# Patient Record
Sex: Female | Born: 1960 | Race: White | Hispanic: No | Marital: Single | Smoking: Current every day smoker
Health system: Southern US, Community
[De-identification: ages and names within clinical notes are randomized; demographics above are authoritative.]

## PROBLEM LIST (undated history)

## (undated) DIAGNOSIS — J439 Emphysema, unspecified: Secondary | ICD-10-CM

## (undated) DIAGNOSIS — T7840XA Allergy, unspecified, initial encounter: Secondary | ICD-10-CM

## (undated) DIAGNOSIS — E782 Mixed hyperlipidemia: Secondary | ICD-10-CM

## (undated) DIAGNOSIS — K8020 Calculus of gallbladder without cholecystitis without obstruction: Secondary | ICD-10-CM

## (undated) DIAGNOSIS — I700 Atherosclerosis of aorta: Secondary | ICD-10-CM

## (undated) DIAGNOSIS — Z789 Other specified health status: Secondary | ICD-10-CM

## (undated) HISTORY — PX: BREAST SURGERY: SHX581

## (undated) HISTORY — PX: AUGMENTATION MAMMAPLASTY: SUR837

## (undated) HISTORY — PX: FACIAL COSMETIC SURGERY: SHX629

## (undated) HISTORY — DX: Allergy, unspecified, initial encounter: T78.40XA

## (undated) HISTORY — DX: Emphysema, unspecified: J43.9

## (undated) HISTORY — PX: CHOLECYSTECTOMY: SHX55

## (undated) HISTORY — PX: COSMETIC SURGERY: SHX468

## (undated) HISTORY — PX: BREAST ENHANCEMENT SURGERY: SHX7

## (undated) HISTORY — DX: Other specified health status: Z78.9

---

## 2005-04-27 ENCOUNTER — Ambulatory Visit: Payer: Self-pay | Admitting: Unknown Physician Specialty

## 2005-05-10 ENCOUNTER — Ambulatory Visit: Payer: Self-pay | Admitting: Unknown Physician Specialty

## 2005-05-24 ENCOUNTER — Ambulatory Visit: Payer: Self-pay | Admitting: Otolaryngology

## 2005-11-20 ENCOUNTER — Ambulatory Visit: Payer: Self-pay | Admitting: Unknown Physician Specialty

## 2006-04-29 ENCOUNTER — Ambulatory Visit: Payer: Self-pay | Admitting: Unknown Physician Specialty

## 2007-06-19 ENCOUNTER — Ambulatory Visit: Payer: Self-pay | Admitting: Unknown Physician Specialty

## 2013-11-04 DIAGNOSIS — D229 Melanocytic nevi, unspecified: Secondary | ICD-10-CM

## 2013-11-04 HISTORY — DX: Melanocytic nevi, unspecified: D22.9

## 2014-05-06 ENCOUNTER — Ambulatory Visit: Payer: Self-pay | Admitting: Internal Medicine

## 2014-06-10 ENCOUNTER — Ambulatory Visit: Payer: Self-pay | Admitting: Gastroenterology

## 2017-04-01 LAB — HM MAMMOGRAPHY

## 2017-10-31 DIAGNOSIS — C4492 Squamous cell carcinoma of skin, unspecified: Secondary | ICD-10-CM

## 2017-10-31 HISTORY — DX: Squamous cell carcinoma of skin, unspecified: C44.92

## 2019-09-17 ENCOUNTER — Ambulatory Visit: Payer: Self-pay

## 2019-09-19 ENCOUNTER — Ambulatory Visit: Payer: 59 | Attending: Internal Medicine

## 2019-09-19 ENCOUNTER — Other Ambulatory Visit: Payer: Self-pay

## 2019-09-19 DIAGNOSIS — Z23 Encounter for immunization: Secondary | ICD-10-CM

## 2019-09-19 NOTE — Progress Notes (Signed)
   Covid-19 Vaccination Clinic  Name:  Aimee Lawson    MRN: [id number] DOB: [birth year]  09/19/2019  Ms. Aimee Lawson was observed post Covid-19 immunization for 15 minutes without incident. She was provided with Vaccine Information Sheet and instruction to access the V-Safe system.   Ms. Aimee Lawson was instructed to call 911 with any severe reactions post vaccine: Marland Kitchen Difficulty breathing  . Swelling of face and throat  . A fast heartbeat  . A bad rash all over body  . Dizziness and weakness   Immunizations Administered    Name Date Dose VIS Date Route   Pfizer COVID-19 Vaccine 09/19/2019  8:23 AM 0.3 mL 06/19/2019 Intramuscular   Manufacturer: [place]   Lot: [id number]   [place]: [id number]

## 2019-09-29 ENCOUNTER — Encounter: Payer: Self-pay | Admitting: Nurse Practitioner

## 2019-09-29 ENCOUNTER — Ambulatory Visit: Payer: 59 | Admitting: Nurse Practitioner

## 2019-09-29 ENCOUNTER — Other Ambulatory Visit: Payer: Self-pay

## 2019-09-29 VITALS — BP 132/79 | HR 83 | Temp 97.9°F | Ht 64.0 in | Wt 175.2 lb

## 2019-09-29 DIAGNOSIS — R17 Unspecified jaundice: Secondary | ICD-10-CM | POA: Diagnosis not present

## 2019-09-29 DIAGNOSIS — Z7689 Persons encountering health services in other specified circumstances: Secondary | ICD-10-CM

## 2019-09-29 DIAGNOSIS — F17210 Nicotine dependence, cigarettes, uncomplicated: Secondary | ICD-10-CM | POA: Diagnosis not present

## 2019-09-29 DIAGNOSIS — R0989 Other specified symptoms and signs involving the circulatory and respiratory systems: Secondary | ICD-10-CM | POA: Diagnosis not present

## 2019-09-29 NOTE — Progress Notes (Signed)
New Patient Office Visit  Subjective:  Patient ID: Aimee Lawson, female    DOB: 03/19/1961  Age: 59 y.o. MRN: [id number]  CC:  Chief Complaint  Patient presents with  . Establish Care  . Eye Problem    pt states she wants to discuss the color of her eyes, states she notices them having a yellow color to them every now and then     HPI Aimee Lawson presents for new patient visit to establish care.  Introduced to Designer, jewellery role and practice setting.  All questions answered.  Used to see previous NP in practice years ago and then had transferred elsewhere for some years.  Last pap in 2018 October.  Had a colonoscopy at age 63 or 7.  She agrees to sign to obtain old records.  JAUNDICE EYES INTERMITTENT: Noticed this over the past month, today they are better.  Does endorse drinking wine in winter, about one to two nights during a week has a couple glasses.  Has occasional beer when out with friends, social drinker, no heavy alcohol use. No history of blood transfusions.  Does have history of MJ and speed use in high school, no injectables.  Does have tattoos, 4 of them.  No Tylenol use at home.  She is a DES baby, her mother took this.  Is a current smoker, 1 PPD, started smoking at age 48.  Has tried everything to quit, patches, Chantix, hypnosis, gum, with no success.  Reports she is on verge of COPD per her previous PCP.  Takes Albuterol and Symbicort as needed.  In past she reports a PCP told her she had 30-50% blockage carotids, but has never seen vascular.  No statin or ASA use. Status: fluctuating Treatments attempted: none Fever: no Nausea: no Vomiting: no Weight loss: no Decreased appetite: no Diarrhea: no Constipation: yes Blood in stool: no Heartburn: no Jaundice: only in eyes per patient Rash: no Dysuria/urinary frequency: no Hematuria: no History of sexually transmitted disease: no Recurrent NSAID use: no     Office Visit from 09/29/2019  in [facility]  AUDIT-C Score  3      History reviewed. No pertinent past medical history.  Past Surgical History:  Procedure Laterality Date  . BREAST ENHANCEMENT SURGERY    . FACIAL COSMETIC SURGERY      Family History  Problem Relation Age of Onset  . Heart attack Mother   . Stroke Mother   . Uterine cancer Mother   . Colon cancer Mother   . Heart attack Father   . Hypertension Brother   . Diabetes Maternal Grandmother   . Heart disease Maternal Grandmother   . Heart disease Maternal Grandfather   . Heart disease Paternal Grandmother   . Heart disease Paternal Grandfather     Social History   Socioeconomic History  . Marital status: Married    Spouse name: Not on file  . Number of children: Not on file  . Years of education: Not on file  . Highest education level: Not on file  Occupational History  . Not on file  Tobacco Use  . Smoking status: Current Every Day Smoker    Packs/day: 1.00    Types: Cigarettes  . Smokeless tobacco: Never Used  Substance and Sexual Activity  . Alcohol use: Yes    Comment: socially  . Drug use: Never  . Sexual activity: Not Currently  Other Topics Concern  . Not on file  Social  History Narrative  . Not on file   Social Determinants of Health   Financial Resource Strain: Low Risk   . Difficulty of Paying Living Expenses: Not hard at all  Food Insecurity: No Food Insecurity  . Worried About Charity fundraiser in the Last Year: Never true  . Ran Out of Food in the Last Year: Never true  Transportation Needs: No Transportation Needs  . Lack of Transportation (Medical): No  . Lack of Transportation (Non-Medical): No  Physical Activity: Sufficiently Active  . Days of Exercise per Week: 5 days  . Minutes of Exercise per Session: 30 min  Stress: No Stress Concern Present  . Feeling of Stress : Not at all  Social Connections: Unknown  . Frequency of Communication with Friends and Family: Three times a week  .  Frequency of Social Gatherings with Friends and Family: Three times a week  . Attends Religious Services: Never  . Active Member of Clubs or Organizations: No  . Attends Archivist Meetings: Never  . Marital Status: Not on file  Intimate Partner Violence:   . Fear of Current or Ex-Partner:   . Emotionally Abused:   Marland Kitchen Physically Abused:   . Sexually Abused:     ROS Review of Systems  Constitutional: Negative for activity change, appetite change, diaphoresis, fatigue and fever.  Respiratory: Negative for cough, chest tightness, shortness of breath and wheezing.   Cardiovascular: Negative for chest pain, palpitations and leg swelling.  Gastrointestinal: Negative.   Neurological: Negative.   Psychiatric/Behavioral: Negative.     Objective:   Today's Vitals: BP 132/79   Pulse 83   Temp 97.9 F (36.6 C) (Oral)   Ht 5\' 4"  (1.626 m)   Wt 175 lb 3.2 oz (79.5 kg)   SpO2 97%   BMI 30.07 kg/m   Physical Exam Vitals and nursing note reviewed.  Constitutional:      General: She is awake. She is not in acute distress.    Appearance: She is well-developed and well-groomed. She is not ill-appearing.  HENT:     Head: Normocephalic.     Right Ear: Hearing normal.     Left Ear: Hearing normal.  Eyes:     General: Lids are normal. No scleral icterus.       Right eye: No discharge.        Left eye: No discharge.     Conjunctiva/sclera: Conjunctivae normal.     Pupils: Pupils are equal, round, and reactive to light.     Comments: No icterus noted today.    Neck:     Thyroid: No thyromegaly.     Vascular: Carotid bruit (bilateral R>L) present.  Cardiovascular:     Rate and Rhythm: Normal rate and regular rhythm.     Heart sounds: Normal heart sounds. No murmur. No gallop.   Pulmonary:     Effort: Pulmonary effort is normal. No accessory muscle usage or respiratory distress.     Breath sounds: Normal breath sounds.  Abdominal:     General: Bowel sounds are normal. There  is no distension or abdominal bruit.     Palpations: Abdomen is soft. There is no hepatomegaly or splenomegaly.     Tenderness: There is no abdominal tenderness.  Musculoskeletal:     Cervical back: Normal range of motion and neck supple.     Right lower leg: No edema.     Left lower leg: No edema.  Lymphadenopathy:     Head:  Right side of head: No submental, submandibular, tonsillar, preauricular or posterior auricular adenopathy.     Left side of head: No submental, submandibular, tonsillar, preauricular or posterior auricular adenopathy.     Cervical: No cervical adenopathy.  Skin:    General: Skin is warm and dry.     Coloration: Skin is not jaundiced.     Comments: No jaundice of skin noted.  Neurological:     Mental Status: She is alert and oriented to person, place, and time.  Psychiatric:        Attention and Perception: Attention normal.        Mood and Affect: Mood normal.        Speech: Speech normal.        Behavior: Behavior normal. Behavior is cooperative.     Assessment & Plan:   Problem List Items Addressed This Visit      Other   Cigarette nicotine dependence without complication    I have recommended complete cessation of tobacco use. I have discussed various options available for assistance with tobacco cessation including over the counter methods (Nicotine gum, patch and lozenges). We also discussed prescription options (Chantix, Nicotine Inhaler / Nasal Spray). The patient is not interested in pursuing any prescription tobacco cessation options at this time. Referral for lung CA screening placed after discussion with patient.       Relevant Orders   Ambulatory Referral for Lung Cancer Scre   Jaundice    Acute recently, improved today.  Reported to bilateral eyes, by patient.  No other symptoms reported and exam WNL.  Will obtain labs today to include CBC, CMP, lipid, TSH, HIV, and Hepatitis labs.  If abnormality noted will send for ultrasound, or if  return of discoloration of eyes noted.  Have recommended complete cessation of smoking.  Return in 6 weeks for annual physical, sooner if worsening or abnormal labs.      Relevant Orders   Hepatitis C antibody   Hepatitis A antibody, IgM   CBC with Differential/Platelet   HIV Antibody (routine testing w rflx)   Bilateral carotid bruits    Noted on exam and reports history of 30-50% blockage bilaterally several years ago.  Recommend complete cessation of smoking.  Discussed benefit of adding on daily statin and ASA, will obtain labs today and further discuss upcoming visit.  Referral to vascular for further assessment and recommendations.      Relevant Orders   Lipid Panel w/o Chol/HDL Ratio   Comprehensive metabolic panel   TSH   Ambulatory referral to Vascular Surgery    Other Visit Diagnoses    Encounter to establish care    -  Primary      Outpatient Encounter Medications as of 09/29/2019  Medication Sig  . albuterol (PROAIR HFA) 108 (90 Base) MCG/ACT inhaler Inhale 2 puffs into the lungs every 6 (six) hours as needed for wheezing or shortness of breath.  . budesonide-formoterol (SYMBICORT) 160-4.5 MCG/ACT inhaler Inhale 2 puffs into the lungs 2 (two) times daily as needed.   No facility-administered encounter medications on file as of 09/29/2019.    Follow-up: Return in about 6 weeks (around 11/10/2019) for Annual physical.   Venita Lick, NP

## 2019-09-29 NOTE — Patient Instructions (Signed)
Fat and Cholesterol Restricted Eating Plan Getting too much fat and cholesterol in your diet may cause health problems. Choosing the right foods helps keep your fat and cholesterol at normal levels. This can keep you from getting certain diseases. Your doctor may recommend an eating plan that includes:  Total fat: ______% or less of total calories a day.  Saturated fat: ______% or less of total calories a day.  Cholesterol: less than _________mg a day.  Fiber: ______g a day. What are tips for following this plan? Meal planning  At meals, divide your plate into four equal parts: ? Fill one-half of your plate with vegetables and green salads. ? Fill one-fourth of your plate with whole grains. ? Fill one-fourth of your plate with low-fat (lean) protein foods.  Eat fish that is high in omega-3 fats at least two times a week. This includes mackerel, tuna, sardines, and salmon.  Eat foods that are high in fiber, such as whole grains, beans, apples, broccoli, carrots, peas, and barley. General tips   Work with your doctor to lose weight if you need to.  Avoid: ? Foods with added sugar. ? Fried foods. ? Foods with partially hydrogenated oils.  Limit alcohol intake to no more than 1 drink a day for nonpregnant women and 2 drinks a day for men. One drink equals 12 oz of beer, 5 oz of wine, or 1 oz of hard liquor. Reading food labels  Check food labels for: ? Trans fats. ? Partially hydrogenated oils. ? Saturated fat (g) in each serving. ? Cholesterol (mg) in each serving. ? Fiber (g) in each serving.  Choose foods with healthy fats, such as: ? Monounsaturated fats. ? Polyunsaturated fats. ? Omega-3 fats.  Choose grain products that have whole grains. Look for the word "whole" as the first word in the ingredient list. Cooking  Cook foods using low-fat methods. These include baking, boiling, grilling, and broiling.  Eat more home-cooked foods. Eat at restaurants and buffets  less often.  Avoid cooking using saturated fats, such as butter, cream, palm oil, palm kernel oil, and coconut oil. Recommended foods  Fruits  All fresh, canned (in natural juice), or frozen fruits. Vegetables  Fresh or frozen vegetables (raw, steamed, roasted, or grilled). Green salads. Grains  Whole grains, such as whole wheat or whole grain breads, crackers, cereals, and pasta. Unsweetened oatmeal, bulgur, barley, quinoa, or brown rice. Corn or whole wheat flour tortillas. Meats and other protein foods  Ground beef (85% or leaner), grass-fed beef, or beef trimmed of fat. Skinless chicken or turkey. Ground chicken or turkey. Pork trimmed of fat. All fish and seafood. Egg whites. Dried beans, peas, or lentils. Unsalted nuts or seeds. Unsalted canned beans. Nut butters without added sugar or oil. Dairy  Low-fat or nonfat dairy products, such as skim or 1% milk, 2% or reduced-fat cheeses, low-fat and fat-free ricotta or cottage cheese, or plain low-fat and nonfat yogurt. Fats and oils  Tub margarine without trans fats. Light or reduced-fat mayonnaise and salad dressings. Avocado. Olive, canola, sesame, or safflower oils. The items listed above may not be a complete list of foods and beverages you can eat. Contact a dietitian for more information. Foods to avoid Fruits  Canned fruit in heavy syrup. Fruit in cream or butter sauce. Fried fruit. Vegetables  Vegetables cooked in cheese, cream, or butter sauce. Fried vegetables. Grains  White bread. White pasta. White rice. Cornbread. Bagels, pastries, and croissants. Crackers and snack foods that contain trans fat   and hydrogenated oils. Meats and other protein foods  Fatty cuts of meat. Ribs, chicken wings, bacon, sausage, bologna, salami, chitterlings, fatback, hot dogs, bratwurst, and packaged lunch meats. Liver and organ meats. Whole eggs and egg yolks. Chicken and turkey with skin. Fried meat. Dairy  Whole or 2% milk, cream,  half-and-half, and cream cheese. Whole milk cheeses. Whole-fat or sweetened yogurt. Full-fat cheeses. Nondairy creamers and whipped toppings. Processed cheese, cheese spreads, and cheese curds. Beverages  Alcohol. Sugar-sweetened drinks such as sodas, lemonade, and fruit drinks. Fats and oils  Butter, stick margarine, lard, shortening, ghee, or bacon fat. Coconut, palm kernel, and palm oils. Sweets and desserts  Corn syrup, sugars, honey, and molasses. Candy. Jam and jelly. Syrup. Sweetened cereals. Cookies, pies, cakes, donuts, muffins, and ice cream. The items listed above may not be a complete list of foods and beverages you should avoid. Contact a dietitian for more information. Summary  Choosing the right foods helps keep your fat and cholesterol at normal levels. This can keep you from getting certain diseases.  At meals, fill one-half of your plate with vegetables and green salads.  Eat high-fiber foods, like whole grains, beans, apples, carrots, peas, and barley.  Limit added sugar, saturated fats, alcohol, and fried foods. This information is not intended to replace advice given to you by your health care provider. Make sure you discuss any questions you have with your health care provider. Document Revised: 02/26/2018 Document Reviewed: 03/12/2017 Elsevier Patient Education  2020 Elsevier Inc.  

## 2019-09-29 NOTE — Assessment & Plan Note (Signed)
Noted on exam and reports history of 30-50% blockage bilaterally several years ago.  Recommend complete cessation of smoking.  Discussed benefit of adding on daily statin and ASA, will obtain labs today and further discuss upcoming visit.  Referral to vascular for further assessment and recommendations.

## 2019-09-29 NOTE — Assessment & Plan Note (Signed)
I have recommended complete cessation of tobacco use. I have discussed various options available for assistance with tobacco cessation including over the counter methods (Nicotine gum, patch and lozenges). We also discussed prescription options (Chantix, Nicotine Inhaler / Nasal Spray). The patient is not interested in pursuing any prescription tobacco cessation options at this time. Referral for lung CA screening placed after discussion with patient.

## 2019-09-29 NOTE — Assessment & Plan Note (Signed)
Acute recently, improved today.  Reported to bilateral eyes, by patient.  No other symptoms reported and exam WNL.  Will obtain labs today to include CBC, CMP, lipid, TSH, HIV, and Hepatitis labs.  If abnormality noted will send for ultrasound, or if return of discoloration of eyes noted.  Have recommended complete cessation of smoking.  Return in 6 weeks for annual physical, sooner if worsening or abnormal labs.

## 2019-09-30 LAB — LIPID PANEL W/O CHOL/HDL RATIO
Cholesterol, Total: 206 mg/dL — ABNORMAL HIGH (ref 100–199)
HDL: 78 mg/dL (ref >39)
LDL Chol Calc (NIH): 108 mg/dL — ABNORMAL HIGH (ref 0–99)
Triglycerides: 114 mg/dL (ref 0–149)
VLDL Cholesterol Cal: 20 mg/dL (ref 5–40)

## 2019-09-30 LAB — COMPREHENSIVE METABOLIC PANEL
ALT: 14 IU/L (ref 0–32)
AST: 14 IU/L (ref 0–40)
Albumin/Globulin Ratio: 2.1 (ref 1.2–2.2)
Albumin: 4.4 g/dL (ref 3.8–4.8)
Alkaline Phosphatase: 98 IU/L (ref 39–117)
BUN/Creatinine Ratio: 20 (ref 12–28)
BUN: 16 mg/dL (ref 8–27)
Bilirubin Total: 0.6 mg/dL (ref 0.0–1.2)
CO2: 26 mmol/L (ref 20–29)
Calcium: 9.2 mg/dL (ref 8.7–10.3)
Chloride: 103 mmol/L (ref 96–106)
Creatinine, Ser: 0.79 mg/dL (ref 0.57–1.00)
GFR calc Af Amer: 93 mL/min/{1.73_m2} (ref >59)
GFR calc non Af Amer: 81 mL/min/{1.73_m2} (ref >59)
Globulin, Total: 2.1 g/dL (ref 1.5–4.5)
Glucose: 79 mg/dL (ref 65–99)
Potassium: 4.2 mmol/L (ref 3.5–5.2)
Sodium: 141 mmol/L (ref 134–144)
Total Protein: 6.5 g/dL (ref 6.0–8.5)

## 2019-09-30 LAB — CBC WITH DIFFERENTIAL/PLATELET
Basophils Absolute: 0 10*3/uL (ref 0.0–0.2)
Basos: 0 %
EOS (ABSOLUTE): 0.1 10*3/uL (ref 0.0–0.4)
Eos: 1 %
Hematocrit: 42.5 % (ref 34.0–46.6)
Hemoglobin: 14.3 g/dL (ref 11.1–15.9)
Immature Grans (Abs): 0 10*3/uL (ref 0.0–0.1)
Immature Granulocytes: 0 %
Lymphocytes Absolute: 2.6 10*3/uL (ref 0.7–3.1)
Lymphs: 42 %
MCH: 31.6 pg (ref 26.6–33.0)
MCHC: 33.6 g/dL (ref 31.5–35.7)
MCV: 94 fL (ref 79–97)
Monocytes Absolute: 0.4 10*3/uL (ref 0.1–0.9)
Monocytes: 7 %
Neutrophils Absolute: 3.1 10*3/uL (ref 1.4–7.0)
Neutrophils: 50 %
Platelets: 207 10*3/uL (ref 150–450)
RBC: 4.53 x10E6/uL (ref 3.77–5.28)
RDW: 12.1 % (ref 11.7–15.4)
WBC: 6.2 10*3/uL (ref 3.4–10.8)

## 2019-09-30 LAB — HEPATITIS A ANTIBODY, IGM: Hep A IgM: NEGATIVE

## 2019-09-30 LAB — HEPATITIS C ANTIBODY: Hep C Virus Ab: <0.1 s/co ratio (ref 0.0–0.9)

## 2019-09-30 LAB — HIV ANTIBODY (ROUTINE TESTING W REFLEX): HIV Screen 4th Generation wRfx: NONREACTIVE

## 2019-09-30 LAB — TSH: TSH: 1.96 u[IU]/mL (ref 0.450–4.500)

## 2019-09-30 NOTE — Progress Notes (Signed)
Good morning.  Please let Eilah know labs returned: - Kidney, liver, thyroid testing normal.  If jaundice to eyes returns before next visit please come in immediately so I can see, at this time liver testing is normal. - Hep A and Hep C are negative, as is HIV - Blood counts show no anemia - cholesterol levels are slightly elevated -- her risk score for stroke or heart even is low at 6.5%, but may benefit from starting a low dose statin to help with prevention of stroke, especially with her bruits which vascular will be seeing her for and smoking history.  If she would like to start a low dose statin let me know or we can discuss it more next visit. Thank you.  Have a great day!!

## 2019-10-01 ENCOUNTER — Telehealth: Payer: Self-pay | Admitting: *Deleted

## 2019-10-01 NOTE — Telephone Encounter (Signed)
Patient returned call. Reports 2nd covid vaccine April 7th, will contact at later date to schedule at least 4 weeks after vaccine.

## 2019-10-01 NOTE — Telephone Encounter (Signed)
Received referral for low dose lung cancer screening CT scan. Message left at phone number listed in EMR for patient to call me back to facilitate scheduling scan.  

## 2019-10-13 ENCOUNTER — Ambulatory Visit: Payer: 59 | Attending: Internal Medicine

## 2019-10-13 DIAGNOSIS — Z23 Encounter for immunization: Secondary | ICD-10-CM

## 2019-10-13 NOTE — Progress Notes (Signed)
   Covid-19 Vaccination Clinic  Name:  Aimee Lawson    MRN: [id number] DOB: [birth year]  10/13/2019  Ms. Aimee Lawson was observed post Covid-19 immunization for 15 minutes without incident. She was provided with Vaccine Information Sheet and instruction to access the V-Safe system.   Ms. Aimee Lawson was instructed to call 911 with any severe reactions post vaccine: Marland Kitchen Difficulty breathing  . Swelling of face and throat  . A fast heartbeat  . A bad rash all over body  . Dizziness and weakness   Immunizations Administered    Name Date Dose VIS Date Route   Pfizer COVID-19 Vaccine 10/13/2019 10:36 AM 0.3 mL 06/19/2019 Intramuscular   Manufacturer: [place]   Lot: [id number]   [place]: [id number]

## 2019-10-21 ENCOUNTER — Other Ambulatory Visit (INDEPENDENT_AMBULATORY_CARE_PROVIDER_SITE_OTHER): Payer: Self-pay | Admitting: Vascular Surgery

## 2019-10-21 DIAGNOSIS — R0989 Other specified symptoms and signs involving the circulatory and respiratory systems: Secondary | ICD-10-CM

## 2019-10-21 DIAGNOSIS — I6523 Occlusion and stenosis of bilateral carotid arteries: Secondary | ICD-10-CM

## 2019-10-22 ENCOUNTER — Other Ambulatory Visit: Payer: Self-pay

## 2019-10-22 ENCOUNTER — Ambulatory Visit (INDEPENDENT_AMBULATORY_CARE_PROVIDER_SITE_OTHER): Payer: 59

## 2019-10-22 ENCOUNTER — Encounter (INDEPENDENT_AMBULATORY_CARE_PROVIDER_SITE_OTHER): Payer: Self-pay | Admitting: Vascular Surgery

## 2019-10-22 ENCOUNTER — Ambulatory Visit (INDEPENDENT_AMBULATORY_CARE_PROVIDER_SITE_OTHER): Payer: 59 | Admitting: Vascular Surgery

## 2019-10-22 VITALS — BP 125/78 | HR 72 | Resp 16 | Ht 64.0 in | Wt 173.0 lb

## 2019-10-22 DIAGNOSIS — R0989 Other specified symptoms and signs involving the circulatory and respiratory systems: Secondary | ICD-10-CM | POA: Diagnosis not present

## 2019-10-22 DIAGNOSIS — I6523 Occlusion and stenosis of bilateral carotid arteries: Secondary | ICD-10-CM | POA: Diagnosis not present

## 2019-10-31 ENCOUNTER — Encounter (INDEPENDENT_AMBULATORY_CARE_PROVIDER_SITE_OTHER): Payer: Self-pay | Admitting: Vascular Surgery

## 2019-10-31 NOTE — Progress Notes (Signed)
MRN : [id number]  Aimee Lawson is a 59 y.o. (January 30, 1961) female who presents with chief complaint of  Chief Complaint  Patient presents with  . New Patient (Initial Visit)    ref Cannady carotid bruit  .  History of Present Illness:   The patient is seen for evaluation of carotid stenosis.   Carotid bruits were auscultated.  The patient denies amaurosis fugax. There is no recent history of TIA symptoms or focal motor deficits. There is no prior documented CVA.  There is no history of migraine headaches. There is no history of seizures.  The patient is taking enteric-coated aspirin 81 mg daily.  The patient has a history of coronary artery disease, no recent episodes of angina or shortness of breath. The patient denies PAD or claudication symptoms. There is a history of hyperlipidemia which is being treated with a statin.    Current Meds  Medication Sig  . albuterol (PROAIR HFA) 108 (90 Base) MCG/ACT inhaler Inhale 2 puffs into the lungs every 6 (six) hours as needed for wheezing or shortness of breath.  . budesonide-formoterol (SYMBICORT) 160-4.5 MCG/ACT inhaler Inhale 2 puffs into the lungs 2 (two) times daily as needed.    Past Medical History:  Diagnosis Date  . No pertinent past medical history     Past Surgical History:  Procedure Laterality Date  . BREAST ENHANCEMENT SURGERY    . FACIAL COSMETIC SURGERY      Social History Social History   Tobacco Use  . Smoking status: Current Every Day Smoker    Packs/day: 1.00    Types: Cigarettes  . Smokeless tobacco: Never Used  Substance Use Topics  . Alcohol use: Yes    Comment: socially  . Drug use: Never    Family History Family History  Problem Relation Age of Onset  . Heart attack Mother   . Stroke Mother   . Uterine cancer Mother   . Colon cancer Mother   . Heart attack Father   . Hypertension Brother   . Diabetes Maternal Grandmother   . Heart disease Maternal Grandmother   . Heart disease  Maternal Grandfather   . Heart disease Paternal Grandmother   . Heart disease Paternal Grandfather   No family history of bleeding/clotting disorders, porphyria or autoimmune disease   Allergies  Allergen Reactions  . Penicillins Anaphylaxis     REVIEW OF SYSTEMS (Negative unless checked)  Constitutional: [] Weight loss  [] Fever  [] Chills Cardiac: [] Chest pain   [] Chest pressure   [] Palpitations   [] Shortness of breath when laying flat   [] Shortness of breath with exertion. Vascular:  [] Pain in legs with walking   [] Pain in legs at rest  [] History of DVT   [] Phlebitis   [] Swelling in legs   [] Varicose veins   [] Non-healing ulcers Pulmonary:   [] Uses home oxygen   [] Productive cough   [] Hemoptysis   [] Wheeze  [] COPD   [] Asthma Neurologic:  [] Dizziness   [] Seizures   [] History of stroke   [] History of TIA  [] Aphasia   [] Vissual changes   [] Weakness or numbness in arm   [] Weakness or numbness in leg Musculoskeletal:   [] Joint swelling   [] Joint pain   [] Low back pain Hematologic:  [] Easy bruising  [] Easy bleeding   [] Hypercoagulable state   [] Anemic Gastrointestinal:  [] Diarrhea   [] Vomiting  [] Gastroesophageal reflux/heartburn   [] Difficulty swallowing. Genitourinary:  [] Chronic kidney disease   [] Difficult urination  [] Frequent urination   [] Blood in urine Skin:  [] Rashes   []   Ulcers  Psychological:  [] History of anxiety   []  History of major depression.  Physical Examination  Vitals:   10/22/19 1530  BP: 125/78  Pulse: 72  Resp: 16  Weight: 173 lb (78.5 kg)  Height: 5\' 4"  (1.626 m)   Body mass index is 29.7 kg/m. Gen: WD/WN, NAD Head: [place]/AT, No temporalis wasting.  Ear/Nose/Throat: Hearing grossly intact, nares w/o erythema or drainage, poor dentition Eyes: PER, EOMI, sclera nonicteric.  Neck: Supple, no masses.  No bruit or JVD.  Pulmonary:  Good air movement, clear to auscultation bilaterally, no use of accessory muscles.  Cardiac: RRR, normal S1, S2, no  Murmurs. Vascular: right carotid bruit noted Vessel Right Left  Radial Palpable Palpable  Ulnar Palpable Palpable  Brachial Palpable Palpable  Carotid Palpable Palpable  Gastrointestinal: soft, non-distended. No guarding/no peritoneal signs.  Musculoskeletal: M/S 5/5 throughout.  No deformity or atrophy.  Neurologic: CN 2-12 intact. Pain and light touch intact in extremities.  Symmetrical.  Speech is fluent. Motor exam as listed above. Psychiatric: Judgment intact, Mood & affect appropriate for pt's clinical situation. Dermatologic: No rashes or ulcers noted.  No changes consistent with cellulitis. Lymph : No Cervical lymphadenopathy, no lichenification or skin changes of chronic lymphedema.  CBC Lab Results  Component Value Date   WBC 6.2 09/29/2019   HGB 14.3 09/29/2019   HCT 42.5 09/29/2019   MCV 94 09/29/2019   PLT 207 09/29/2019    BMET    Component Value Date/Time   NA 141 09/29/2019 1425   K 4.2 09/29/2019 1425   CL 103 09/29/2019 1425   CO2 26 09/29/2019 1425   GLUCOSE 79 09/29/2019 1425   BUN 16 09/29/2019 1425   CREATININE 0.79 09/29/2019 1425   CALCIUM 9.2 09/29/2019 1425   GFRNONAA 81 09/29/2019 1425   GFRAA 93 09/29/2019 1425   CrCl cannot be calculated (Patient's most recent lab result is older than the maximum 21 days allowed.).  COAG No results found for: INR, PROTIME  Radiology VAS US CAROTID  Result Date: 10/29/2019 Carotid Arterial Duplex Study Indications: Bilateral bruits. Performing Technologist: Almira Coaster RVS  Examination Guidelines: A complete evaluation includes B-mode imaging, spectral Doppler, color Doppler, and power Doppler as needed of all accessible portions of each vessel. Bilateral testing is considered an integral part of a complete examination. Limited examinations for reoccurring indications may be performed as noted.  Right Carotid Findings: +----------+--------+--------+--------+------------------+--------+           PSV  cm/sEDV cm/sStenosisPlaque DescriptionComments +----------+--------+--------+--------+------------------+--------+ CCA Prox  123     33                                         +----------+--------+--------+--------+------------------+--------+ CCA Mid   100     31                                         +----------+--------+--------+--------+------------------+--------+ CCA Distal95      28                                         +----------+--------+--------+--------+------------------+--------+ ICA Prox  79      29                                         +----------+--------+--------+--------+------------------+--------+  ICA Mid   94      36                                         +----------+--------+--------+--------+------------------+--------+ ICA Distal85      37                                         +----------+--------+--------+--------+------------------+--------+ ECA       124     24                                         +----------+--------+--------+--------+------------------+--------+ +----------+--------+-------+--------+-------------------+           PSV cm/sEDV cmsDescribeArm Pressure (mmHG) +----------+--------+-------+--------+-------------------+ [id number]     0                                  +----------+--------+-------+--------+-------------------+ +---------+--------+--+--------+--+ VertebralPSV cm/s66EDV cm/s21 +---------+--------+--+--------+--+  Left Carotid Findings: +----------+--------+--------+--------+------------------+--------+           PSV cm/sEDV cm/sStenosisPlaque DescriptionComments +----------+--------+--------+--------+------------------+--------+ CCA Prox  114     36                                         +----------+--------+--------+--------+------------------+--------+ CCA Mid   90      28                                          +----------+--------+--------+--------+------------------+--------+ CCA Distal81      33                                         +----------+--------+--------+--------+------------------+--------+ ICA Prox  56      24                                         +----------+--------+--------+--------+------------------+--------+ ICA Mid   93      40                                         +----------+--------+--------+--------+------------------+--------+ ICA Distal86      38                                         +----------+--------+--------+--------+------------------+--------+ ECA       89      19                                         +----------+--------+--------+--------+------------------+--------+ +----------+--------+--------+--------+-------------------+  PSV cm/sEDV cm/sDescribeArm Pressure (mmHG) +----------+--------+--------+--------+-------------------+ Subclavian201     0                                   +----------+--------+--------+--------+-------------------+ +---------+--------+--+--------+--+ VertebralPSV cm/s92EDV cm/s33 +---------+--------+--+--------+--+   Summary: Right Carotid: There is no evidence of stenosis in the right ICA. Left Carotid: There is no evidence of stenosis in the left ICA. Vertebrals:  Bilateral vertebral arteries demonstrate antegrade flow. Subclavians: Normal flow hemodynamics were seen in bilateral subclavian              arteries. *See table(s) above for measurements and observations.  Electronically signed by Hortencia Pilar MD on 10/29/2019 at 9:08:11 AM.    Final      Assessment/Plan 1. Bilateral carotid bruits Recommend:  Given the patient's asymptomatic subcritical stenosis no further invasive testing or surgery at this time.  Duplex ultrasound shows <50% stenosis bilaterally which has been unchanged when compared to the previous studies.  Continue antiplatelet therapy as prescribed Continue  management of CAD, HTN and Hyperlipidemia Healthy heart diet,  encouraged exercise at least 4 times per week  Given the stable <50% bilateral carotid stenosis in association with the patient's age the patient will follow up PRN.  The patient is told that if symptoms of a TIA should occur then he should go to the ER and I should be notified, as this would change the management course.  The patient voices understanding.     Hortencia Pilar, MD  10/31/2019 12:52 PM

## 2019-11-11 ENCOUNTER — Encounter: Payer: Self-pay | Admitting: Nurse Practitioner

## 2019-11-11 ENCOUNTER — Other Ambulatory Visit: Payer: Self-pay

## 2019-11-11 ENCOUNTER — Ambulatory Visit (INDEPENDENT_AMBULATORY_CARE_PROVIDER_SITE_OTHER): Payer: 59 | Admitting: Nurse Practitioner

## 2019-11-11 VITALS — BP 104/66 | HR 75 | Temp 98.1°F | Ht 64.0 in | Wt 172.0 lb

## 2019-11-11 DIAGNOSIS — Z6829 Body mass index (BMI) 29.0-29.9, adult: Secondary | ICD-10-CM

## 2019-11-11 DIAGNOSIS — E7800 Pure hypercholesterolemia, unspecified: Secondary | ICD-10-CM

## 2019-11-11 DIAGNOSIS — E785 Hyperlipidemia, unspecified: Secondary | ICD-10-CM | POA: Insufficient documentation

## 2019-11-11 DIAGNOSIS — F17210 Nicotine dependence, cigarettes, uncomplicated: Secondary | ICD-10-CM

## 2019-11-11 DIAGNOSIS — R0989 Other specified symptoms and signs involving the circulatory and respiratory systems: Secondary | ICD-10-CM | POA: Diagnosis not present

## 2019-11-11 DIAGNOSIS — Z1211 Encounter for screening for malignant neoplasm of colon: Secondary | ICD-10-CM

## 2019-11-11 DIAGNOSIS — Z1231 Encounter for screening mammogram for malignant neoplasm of breast: Secondary | ICD-10-CM

## 2019-11-11 DIAGNOSIS — Z6831 Body mass index (BMI) 31.0-31.9, adult: Secondary | ICD-10-CM | POA: Insufficient documentation

## 2019-11-11 DIAGNOSIS — Z0000 Encounter for general adult medical examination without abnormal findings: Secondary | ICD-10-CM | POA: Diagnosis not present

## 2019-11-11 DIAGNOSIS — E669 Obesity, unspecified: Secondary | ICD-10-CM | POA: Insufficient documentation

## 2019-11-11 NOTE — Assessment & Plan Note (Signed)
<  50% stenosis on imaging.  Follow-up with vascular PRN or is symptomatic, plan to recheck imaging in 5-6 years.  Recommend complete cessation of smoking. 

## 2019-11-11 NOTE — Assessment & Plan Note (Signed)
Noted on recent labs, ASCVD 4.1%.  Recommend focus on diet and exercise regimen + complete cessation smoking.

## 2019-11-11 NOTE — Progress Notes (Signed)
BP 104/66   Pulse 75   Temp 98.1 F (36.7 C) (Oral)   Ht 5\' 4"  (1.626 m)   Wt 172 lb (78 kg)   LMP 09/07/2010 (Approximate)   SpO2 98%   BMI 29.52 kg/m    Subjective:    Patient ID: Aimee Lawson, female    DOB: [birth year], 59 y.o.   MRN: [id number]  HPI: Aimee Lawson is a 59 y.o. female presenting on 11/11/2019 for comprehensive medical examination. Current medical complaints include:none  She currently lives with: self Menopausal Symptoms: no   CAROTID BRUITS: Saw vascular on 10/22/2019 -- had imaging which showed <50% stenosis bilaterally, unchanged from previous studies.  Asymptomatic.  To recheck in 5-6 years.  She continues to smoke about 1 PPD, not interested in quitting at this time.  Is going to have lung screening in June.  Has inhalers, but rarely uses.  The 10-year ASCVD risk score Mikey Bussing DC Brooke Bonito., et al., 2013) is: 4.1%   Values used to calculate the score:     Age: 59 years     Sex: Female     Is Non-Hispanic African American: No     Diabetic: No     Tobacco smoker: Yes     Systolic Blood Pressure: 123456 mmHg     Is BP treated: No     HDL Cholesterol: 78 mg/dL     Total Cholesterol: 206 mg/dL  Depression Screen done today and results listed below:  Depression screen [facility] 2/9 09/29/2019  Decreased Interest 0  Down, Depressed, Hopeless 0  PHQ - 2 Score 0    The patient does not have a history of falls. I did not complete a risk assessment for falls. A plan of care for falls was not documented.   Past Medical History:  Past Medical History:  Diagnosis Date  . No pertinent past medical history     Surgical History:  Past Surgical History:  Procedure Laterality Date  . BREAST ENHANCEMENT SURGERY    . FACIAL COSMETIC SURGERY      Medications:  Current Outpatient Medications on File Prior to Visit  Medication Sig  . albuterol (PROAIR HFA) 108 (90 Base) MCG/ACT inhaler Inhale 2 puffs into the lungs every 6 (six) hours as needed for wheezing or  shortness of breath.  . budesonide-formoterol (SYMBICORT) 160-4.5 MCG/ACT inhaler Inhale 2 puffs into the lungs 2 (two) times daily as needed.   No current facility-administered medications on file prior to visit.    Allergies:  Allergies  Allergen Reactions  . Penicillins Anaphylaxis    Social History:  Social History   Socioeconomic History  . Marital status: Married    Spouse name: Not on file  . Number of children: Not on file  . Years of education: Not on file  . Highest education level: Not on file  Occupational History  . Not on file  Tobacco Use  . Smoking status: Current Every Day Smoker    Packs/day: 1.00    Types: Cigarettes  . Smokeless tobacco: Never Used  Substance and Sexual Activity  . Alcohol use: Yes    Comment: socially  . Drug use: Never  . Sexual activity: Not Currently  Other Topics Concern  . Not on file  Social History Narrative  . Not on file   Social Determinants of Health   Financial Resource Strain: Low Risk   . Difficulty of Paying Living Expenses: Not hard at all  Food Insecurity: No Food Insecurity  .  Worried About Charity fundraiser in the Last Year: Never true  . Ran Out of Food in the Last Year: Never true  Transportation Needs: No Transportation Needs  . Lack of Transportation (Medical): No  . Lack of Transportation (Non-Medical): No  Physical Activity: Sufficiently Active  . Days of Exercise per Week: 5 days  . Minutes of Exercise per Session: 30 min  Stress: No Stress Concern Present  . Feeling of Stress : Not at all  Social Connections: Unknown  . Frequency of Communication with Friends and Family: Three times a week  . Frequency of Social Gatherings with Friends and Family: Three times a week  . Attends Religious Services: Never  . Active Member of Clubs or Organizations: No  . Attends Archivist Meetings: Never  . Marital Status: Not on file  Intimate Partner Violence:   . Fear of Current or Ex-Partner:     . Emotionally Abused:   Marland Kitchen Physically Abused:   . Sexually Abused:    Social History   Tobacco Use  Smoking Status Current Every Day Smoker  . Packs/day: 1.00  . Types: Cigarettes  Smokeless Tobacco Never Used   Social History   Substance and Sexual Activity  Alcohol Use Yes   Comment: socially    Family History:  Family History  Problem Relation Age of Onset  . Heart attack Mother   . Stroke Mother   . Uterine cancer Mother   . Colon cancer Mother   . Heart attack Father   . Hypertension Brother   . Diabetes Maternal Grandmother   . Heart disease Maternal Grandmother   . Heart disease Maternal Grandfather   . Heart disease Paternal Grandmother   . Heart disease Paternal Grandfather     Past medical history, surgical history, medications, allergies, family history and social history reviewed with patient today and changes made to appropriate areas of the chart.   Review of Systems - negative All other ROS negative except what is listed above and in the HPI.      Objective:    BP 104/66   Pulse 75   Temp 98.1 F (36.7 C) (Oral)   Ht 5\' 4"  (1.626 m)   Wt 172 lb (78 kg)   LMP 09/07/2010 (Approximate)   SpO2 98%   BMI 29.52 kg/m   Wt Readings from Last 3 Encounters:  11/11/19 172 lb (78 kg)  10/22/19 173 lb (78.5 kg)  09/29/19 175 lb 3.2 oz (79.5 kg)    Physical Exam Constitutional:      General: She is awake. She is not in acute distress.    Appearance: She is well-developed. She is not ill-appearing.  HENT:     Head: Normocephalic and atraumatic.     Right Ear: Hearing, tympanic membrane, ear canal and external ear normal. No drainage.     Left Ear: Hearing, tympanic membrane, ear canal and external ear normal. No drainage.     Nose: Nose normal.     Right Sinus: No maxillary sinus tenderness or frontal sinus tenderness.     Left Sinus: No maxillary sinus tenderness or frontal sinus tenderness.     Mouth/Throat:     Mouth: Mucous membranes are  moist.     Pharynx: Oropharynx is clear. Uvula midline. No pharyngeal swelling, oropharyngeal exudate or posterior oropharyngeal erythema.  Eyes:     General: Lids are normal.        Right eye: No discharge.  Left eye: No discharge.     Extraocular Movements: Extraocular movements intact.     Conjunctiva/sclera: Conjunctivae normal.     Pupils: Pupils are equal, round, and reactive to light.     Visual Fields: Right eye visual fields normal and left eye visual fields normal.  Neck:     Thyroid: No thyromegaly.     Vascular: Carotid bruit (subtle R>L) present.     Trachea: Trachea normal.  Cardiovascular:     Rate and Rhythm: Normal rate and regular rhythm.     Heart sounds: Normal heart sounds. No murmur. No gallop.   Pulmonary:     Effort: Pulmonary effort is normal. No accessory muscle usage or respiratory distress.     Breath sounds: Normal breath sounds.  Chest:     Breasts:        Right: Normal.        Left: Normal.  Abdominal:     General: Bowel sounds are normal.     Palpations: Abdomen is soft. There is no hepatomegaly or splenomegaly.     Tenderness: There is no abdominal tenderness.  Musculoskeletal:        General: Normal range of motion.     Cervical back: Normal range of motion and neck supple.     Right lower leg: No edema.     Left lower leg: No edema.  Lymphadenopathy:     Head:     Right side of head: No submental, submandibular, tonsillar, preauricular or posterior auricular adenopathy.     Left side of head: No submental, submandibular, tonsillar, preauricular or posterior auricular adenopathy.     Cervical: No cervical adenopathy.     Upper Body:     Right upper body: No supraclavicular, axillary or pectoral adenopathy.     Left upper body: No supraclavicular, axillary or pectoral adenopathy.  Skin:    General: Skin is warm and dry.     Capillary Refill: Capillary refill takes less than 2 seconds.     Findings: No rash.  Neurological:     Mental  Status: She is alert and oriented to person, place, and time.     Cranial Nerves: Cranial nerves are intact.     Gait: Gait is intact.     Deep Tendon Reflexes: Reflexes are normal and symmetric.     Reflex Scores:      Brachioradialis reflexes are 2+ on the right side and 2+ on the left side.      Patellar reflexes are 2+ on the right side and 2+ on the left side. Psychiatric:        Attention and Perception: Attention normal.        Mood and Affect: Mood normal.        Speech: Speech normal.        Behavior: Behavior normal. Behavior is cooperative.        Thought Content: Thought content normal.        Judgment: Judgment normal.     Results for orders placed or performed in visit on 09/29/19  HM MAMMOGRAPHY  Result Value Ref Range   HM Mammogram 0-4 Bi-Rad 0-4 Bi-Rad, Self Reported Normal  Lipid Panel w/o Chol/HDL Ratio  Result Value Ref Range   Cholesterol, Total 206 (H) 100 - 199 mg/dL   Triglycerides 114 0 - 149 mg/dL   HDL 78 >39 mg/dL   VLDL Cholesterol Cal 20 5 - 40 mg/dL   LDL Chol Calc (NIH) 108 (H) 0 - 99  mg/dL  Comprehensive metabolic panel  Result Value Ref Range   Glucose 79 65 - 99 mg/dL   BUN 16 8 - 27 mg/dL   Creatinine, Ser 0.79 0.57 - 1.00 mg/dL   GFR calc non Af Amer 81 >59 mL/min/1.73   GFR calc Af Amer 93 >59 mL/min/1.73   BUN/Creatinine Ratio 20 12 - 28   Sodium 141 134 - 144 mmol/L   Potassium 4.2 3.5 - 5.2 mmol/L   Chloride 103 96 - 106 mmol/L   CO2 26 20 - 29 mmol/L   Calcium 9.2 8.7 - 10.3 mg/dL   Total Protein 6.5 6.0 - 8.5 g/dL   Albumin 4.4 3.8 - 4.8 g/dL   Globulin, Total 2.1 1.5 - 4.5 g/dL   Albumin/Globulin Ratio 2.1 1.2 - 2.2   Bilirubin Total 0.6 0.0 - 1.2 mg/dL   Alkaline Phosphatase 98 39 - 117 IU/L   AST 14 0 - 40 IU/L   ALT 14 0 - 32 IU/L  Hepatitis C antibody  Result Value Ref Range   Hep C Virus Ab <0.1 0.0 - 0.9 s/co ratio  Hepatitis A antibody, IgM  Result Value Ref Range   Hep A IgM Negative Negative  CBC with  Differential/Platelet  Result Value Ref Range   WBC 6.2 3.4 - 10.8 x10E3/uL   RBC 4.53 3.77 - 5.28 x10E6/uL   Hemoglobin 14.3 11.1 - 15.9 g/dL   Hematocrit 42.5 34.0 - 46.6 %   MCV 94 79 - 97 fL   MCH 31.6 26.6 - 33.0 pg   MCHC 33.6 31.5 - 35.7 g/dL   RDW 12.1 11.7 - 15.4 %   Platelets 207 150 - 450 x10E3/uL   Neutrophils 50 Not Estab. %   Lymphs 42 Not Estab. %   Monocytes 7 Not Estab. %   Eos 1 Not Estab. %   Basos 0 Not Estab. %   Neutrophils Absolute 3.1 1.4 - 7.0 x10E3/uL   Lymphocytes Absolute 2.6 0.7 - 3.1 x10E3/uL   Monocytes Absolute 0.4 0.1 - 0.9 x10E3/uL   EOS (ABSOLUTE) 0.1 0.0 - 0.4 x10E3/uL   Basophils Absolute 0.0 0.0 - 0.2 x10E3/uL   Immature Granulocytes 0 Not Estab. %   Immature Grans (Abs) 0.0 0.0 - 0.1 x10E3/uL  TSH  Result Value Ref Range   TSH 1.960 0.450 - 4.500 uIU/mL  HIV Antibody (routine testing w rflx)  Result Value Ref Range   HIV Screen 4th Generation wRfx Non Reactive Non Reactive      Assessment & Plan:   Problem List Items Addressed This Visit      Other   Cigarette nicotine dependence without complication    I have recommended complete cessation of tobacco use. I have discussed various options available for assistance with tobacco cessation including over the counter methods (Nicotine gum, patch and lozenges). We also discussed prescription options (Chantix, Nicotine Inhaler / Nasal Spray). The patient is not interested in pursuing any prescription tobacco cessation options at this time.       Bilateral carotid bruits    <50% stenosis on imaging.  Follow-up with vascular PRN or is symptomatic, plan to recheck imaging in 5-6 years.  Recommend complete cessation of smoking.      Elevated LDL cholesterol level    Noted on recent labs, ASCVD 4.1%.  Recommend focus on diet and exercise regimen + complete cessation smoking.      BMI 29.0-29.9,adult    Recommended eating smaller high protein, low fat meals more frequently  and exercising 30  mins a day 5 times a week with a goal of 10-15lb weight loss in the next 3 months. Patient voiced their understanding and motivation to adhere to these recommendations.        Other Visit Diagnoses    Encounter for annual physical exam    -  Primary   Had all labs performed recent visit   Colon cancer screening       Cologuard order   Relevant Orders   Cologuard   Encounter for screening mammogram for malignant neoplasm of breast       Mammogram order   Relevant Orders   MM 3D SCREEN BREAST BILATERAL       Follow up plan: Return in about 5 months (around 04/12/2020) for Pap smear.   LABORATORY TESTING:  - Pap smear: Due in October, history of + HPV  IMMUNIZATIONS:   - Tdap: Tetanus vaccination status reviewed: just had Covid vaccine - Influenza: Up to date - Pneumovax: Not applicable - Prevnar: Not applicable - HPV: Not applicable - Zostavax vaccine: Refused  SCREENING: -Mammogram: Ordered today  - Colonoscopy: Ordered today -- Cologuard - Bone Density: Not applicable  -Hearing Test: Not applicable  -Spirometry: Not applicable   PATIENT COUNSELING:   Advised to take 1 mg of folate supplement per day if capable of pregnancy.   Sexuality: Discussed sexually transmitted diseases, partner selection, use of condoms, avoidance of unintended pregnancy  and contraceptive alternatives.   Advised to avoid cigarette smoking.  I discussed with the patient that most people either abstain from alcohol or drink within safe limits (<=14/week and <=4 drinks/occasion for males, <=7/weeks and <= 3 drinks/occasion for females) and that the risk for alcohol disorders and other health effects rises proportionally with the number of drinks per week and how often a drinker exceeds daily limits.  Discussed cessation/primary prevention of drug use and availability of treatment for abuse.   Diet: Encouraged to adjust caloric intake to maintain  or achieve ideal body weight, to reduce intake  of dietary saturated fat and total fat, to limit sodium intake by avoiding high sodium foods and not adding table salt, and to maintain adequate dietary potassium and calcium preferably from fresh fruits, vegetables, and low-fat dairy products.    stressed the importance of regular exercise  Injury prevention: Discussed safety belts, safety helmets, smoke detector, smoking near bedding or upholstery.   Dental health: Discussed importance of regular tooth brushing, flossing, and dental visits.    NEXT PREVENTATIVE PHYSICAL DUE IN 1 YEAR. Return in about 5 months (around 04/12/2020) for Pap smear.

## 2019-11-11 NOTE — Patient Instructions (Signed)
[facility]  Address: [address], [place], [place] [address]  Phone: [phone]   Mammogram A mammogram is an X-ray of the breasts that is done to check for changes that are not normal. This test can screen for and find any changes that may suggest breast cancer. Mammograms are regularly done on women. A man may have a mammogram if he has a lump or swelling in his breast. This test can also help to find other changes and variations in the breast. Tell a doctor:  About any allergies you have.  If you have breast implants.  If you have had previous breast disease, biopsy, or surgery.  If you are breastfeeding.  If you are younger than age 48.  If you have a family history of breast cancer.  Whether you are pregnant or may be pregnant. What are the risks? Generally, this is a safe procedure. However, problems may occur, including:  Exposure to radiation. Radiation levels are very low with this test.  The results being misinterpreted.  The need for further tests.  The inability of the mammogram to detect certain cancers. What happens before the procedure?  Have this test done about 1-2 weeks after your period. This is usually when your breasts are the least tender.  If you are visiting a new doctor or clinic, send any past mammogram images to your new doctor's office.  Wash your breasts and under your arms the day of the test.  Do not use deodorants, perfumes, lotions, or powders on the day of the test.  Take off any jewelry from your neck.  Wear clothes that you can change into and out of easily. What happens during the procedure?   You will undress from the waist up. You will put on a gown.  You will stand in front of the X-ray machine.  Each breast will be placed between two plastic or glass plates. The plates will press down on your breast for a few seconds. Try to stay as relaxed as possible. This does not cause any  harm to your breasts. Any discomfort you feel will be very brief.  X-rays will be taken from different angles of each breast. The procedure may vary among doctors and hospitals. What happens after the procedure?  The mammogram will be read by a specialist (radiologist).  You may need to do certain parts of the test again. This depends on the quality of the images.  Ask when your test results will be ready. Make sure you get your test results.  You may go back to your normal activities. Summary  A mammogram is a low energy X-ray of the breasts that is done to check for abnormal changes. A man may have this test if he has a lump or swelling in his breast.  Before the procedure, tell your doctor about any breast problems that you have had in the past.  Have this test done about 1-2 weeks after your period.  For the test, each breast will be placed between two plastic or glass plates. The plates will press down on your breast for a few seconds.  The mammogram will be read by a specialist (radiologist). Ask when your test results will be ready. Make sure you get your test results. This information is not intended to replace advice given to you by your health care provider. Make sure you discuss any questions you have with your health care provider. Document Revised: 02/13/2018 Document  Reviewed: 02/13/2018 Elsevier Patient Education  2020 Elsevier Inc.  

## 2019-11-11 NOTE — Assessment & Plan Note (Signed)
Recommended eating smaller high protein, low fat meals more frequently and exercising 30 mins a day 5 times a week with a goal of 10-15lb weight loss in the next 3 months. Patient voiced their understanding and motivation to adhere to these recommendations.  

## 2019-11-11 NOTE — Assessment & Plan Note (Signed)
I have recommended complete cessation of tobacco use. I have discussed various options available for assistance with tobacco cessation including over the counter methods (Nicotine gum, patch and lozenges). We also discussed prescription options (Chantix, Nicotine Inhaler / Nasal Spray). The patient is not interested in pursuing any prescription tobacco cessation options at this time.  

## 2019-11-30 ENCOUNTER — Encounter: Payer: Self-pay | Admitting: *Deleted

## 2019-11-30 ENCOUNTER — Telehealth: Payer: Self-pay | Admitting: *Deleted

## 2019-11-30 DIAGNOSIS — Z122 Encounter for screening for malignant neoplasm of respiratory organs: Secondary | ICD-10-CM

## 2019-11-30 DIAGNOSIS — Z87891 Personal history of nicotine dependence: Secondary | ICD-10-CM

## 2019-11-30 NOTE — Telephone Encounter (Signed)
Received referral for initial lung cancer screening scan. Contacted patient and obtained smoking history,(current, 66 pack year) as well as answering questions related to screening process. Patient denies signs of lung cancer such as weight loss or hemoptysis. Patient denies comorbidity that would prevent curative treatment if lung cancer were found. Patient is scheduled for shared decision making visit and CT scan on 12/16/19 at 1015am.

## 2019-12-16 ENCOUNTER — Other Ambulatory Visit: Payer: Self-pay

## 2019-12-16 ENCOUNTER — Inpatient Hospital Stay: Payer: 59 | Attending: Oncology | Admitting: Oncology

## 2019-12-16 ENCOUNTER — Ambulatory Visit
Admission: RE | Admit: 2019-12-16 | Discharge: 2019-12-16 | Disposition: A | Discharge status: Home / Self Care | Payer: 59 | Source: Ambulatory Visit | Attending: Oncology | Admitting: Oncology

## 2019-12-16 DIAGNOSIS — Z122 Encounter for screening for malignant neoplasm of respiratory organs: Secondary | ICD-10-CM | POA: Diagnosis present

## 2019-12-16 DIAGNOSIS — Z87891 Personal history of nicotine dependence: Secondary | ICD-10-CM

## 2019-12-16 DIAGNOSIS — F17210 Nicotine dependence, cigarettes, uncomplicated: Secondary | ICD-10-CM

## 2019-12-16 NOTE — Progress Notes (Signed)
Virtual Visit via Video Note  I connected with Mrs. Joles on 12/16/19 at 10:15 AM EDT by a video enabled telemedicine application and verified that I am speaking with the correct person using two identifiers.  Location: Patient: [facility] Provider: Clinic    I discussed the limitations of evaluation and management by telemedicine and the availability of in person appointments. The patient expressed understanding and agreed to proceed.  I discussed the assessment and treatment plan with the patient. The patient was provided an opportunity to ask questions and all were answered. The patient agreed with the plan and demonstrated an understanding of the instructions.   The patient was advised to call back or seek an in-person evaluation if the symptoms worsen or if the condition fails to improve as anticipated.   In accordance with CMS guidelines, patient has met eligibility criteria including age, absence of signs or symptoms of lung cancer.  Social History   Tobacco Use   Smoking status: Current Every Day Smoker    Packs/day: 1.50    Years: 44.00    Pack years: 66.00    Types: Cigarettes   Smokeless tobacco: Never Used  Substance Use Topics   Alcohol use: Yes    Comment: socially   Drug use: Never      A shared decision-making session was conducted prior to the performance of CT scan. This includes one or more decision aids, includes benefits and harms of screening, follow-up diagnostic testing, over-diagnosis, false positive rate, and total radiation exposure.   Counseling on the importance of adherence to annual lung cancer LDCT screening, impact of co-morbidities, and ability or willingness to undergo diagnosis and treatment is imperative for compliance of the program.   Counseling on the importance of continued smoking cessation for former smokers; the importance of smoking cessation for current smokers, and information about tobacco cessation interventions have been given to  patient including [place] and 1800 quit [place] programs.   Written order for lung cancer screening with LDCT has been given to the patient and any and all questions have been answered to the best of my abilities.    Yearly follow up will be coordinated by Burgess Estelle, Thoracic Navigator.  I provided 15 minutes of face-to-face video visit time during this encounter, and > 50% was spent counseling as documented under my assessment & plan.   Jacquelin Hawking, NP

## 2019-12-21 ENCOUNTER — Encounter: Payer: Self-pay | Admitting: Nurse Practitioner

## 2019-12-21 ENCOUNTER — Encounter: Payer: Self-pay | Admitting: *Deleted

## 2019-12-21 DIAGNOSIS — I700 Atherosclerosis of aorta: Secondary | ICD-10-CM | POA: Insufficient documentation

## 2019-12-21 DIAGNOSIS — J432 Centrilobular emphysema: Secondary | ICD-10-CM | POA: Insufficient documentation

## 2019-12-21 DIAGNOSIS — K8020 Calculus of gallbladder without cholecystitis without obstruction: Secondary | ICD-10-CM | POA: Insufficient documentation

## 2019-12-21 DIAGNOSIS — J441 Chronic obstructive pulmonary disease with (acute) exacerbation: Secondary | ICD-10-CM | POA: Insufficient documentation

## 2019-12-28 ENCOUNTER — Telehealth: Payer: Self-pay | Admitting: Nurse Practitioner

## 2019-12-28 NOTE — Telephone Encounter (Signed)
Aimee Lawson please call patient and schedule her for Friday -- after 10 am and before 4 pm.  Thanks!!  Spoke to patient on telephone, recommended she schedule follow-up for upcoming week to discuss smoking cessation options and obtain PFTs.  Discussed options for inhaler regimen with emphysema, she may benefit from daily LABA/LAMA as reports lots of mucus.  Discussed aortic atherosclerosis and preventative care with this: Baby ASA daily and statin + smoking cessation.  Discussed progressive nature of emphysema, but ways to slow down progression such as smoking cessation.  Reviewed gallstones, she is asymptomatic and will monitor.

## 2019-12-28 NOTE — Telephone Encounter (Signed)
Copied from [place] [id number]. Topic: General - Other >> Dec 28, 2019  9:17 AM Oneta Rack wrote: Reason for CRM: patient view CT results on My Chart and would like to speak with PCP to discuss further

## 2020-01-01 ENCOUNTER — Encounter: Payer: Self-pay | Admitting: Nurse Practitioner

## 2020-01-01 ENCOUNTER — Other Ambulatory Visit: Payer: Self-pay

## 2020-01-01 ENCOUNTER — Ambulatory Visit (INDEPENDENT_AMBULATORY_CARE_PROVIDER_SITE_OTHER): Payer: 59 | Admitting: Nurse Practitioner

## 2020-01-01 VITALS — BP 115/72 | HR 75 | Temp 97.6°F | Wt 176.0 lb

## 2020-01-01 DIAGNOSIS — I700 Atherosclerosis of aorta: Secondary | ICD-10-CM | POA: Diagnosis not present

## 2020-01-01 DIAGNOSIS — E7800 Pure hypercholesterolemia, unspecified: Secondary | ICD-10-CM

## 2020-01-01 DIAGNOSIS — J432 Centrilobular emphysema: Secondary | ICD-10-CM | POA: Diagnosis not present

## 2020-01-01 DIAGNOSIS — F17210 Nicotine dependence, cigarettes, uncomplicated: Secondary | ICD-10-CM

## 2020-01-01 DIAGNOSIS — Z6829 Body mass index (BMI) 29.0-29.9, adult: Secondary | ICD-10-CM

## 2020-01-01 MED ORDER — NICOTINE 21 MG/24HR TD PT24: 21.0000 mg | MEDICATED_PATCH | Freq: Every day | TRANSDERMAL | 0 refills | Status: DC

## 2020-01-01 MED ORDER — ANORO ELLIPTA 62.5-25 MCG/INH IN AEPB: 1.0000 | INHALATION_SPRAY | Freq: Every day | RESPIRATORY_TRACT | 4 refills | Status: DC

## 2020-01-01 MED ORDER — CHANTIX STARTING MONTH PAK 0.5 MG X 11 & 1 MG X 42 PO TABS
ORAL_TABLET | ORAL | 0 refills | Status: DC
Start: 2020-01-01 — End: 2020-03-03

## 2020-01-01 MED ORDER — ATORVASTATIN CALCIUM 10 MG PO TABS: 10.0000 mg | ORAL_TABLET | Freq: Every day | ORAL | 3 refills | Status: DC

## 2020-01-01 NOTE — Assessment & Plan Note (Signed)
Noted on recent labs, educated at length.  Script sent for Atorvastatin 10 MG daily.  Will plan to follow-up in 9 weeks in office and check labs.  Recommend diet changes and modest loss.

## 2020-01-01 NOTE — Assessment & Plan Note (Signed)
Recommended eating smaller high protein, low fat meals more frequently and exercising 30 mins a day 5 times a week with a goal of 10-15lb weight loss in the next 3 months. Patient voiced their understanding and motivation to adhere to these recommendations.  

## 2020-01-01 NOTE — Assessment & Plan Note (Signed)
Scripts sent for Chantix and patches, she will set quit date for upcoming months when her new house is completed and has less stressors.  Recommend complete cessation.

## 2020-01-01 NOTE — Progress Notes (Signed)
BP 115/72   Pulse 75   Temp 97.6 F (36.4 C) (Oral)   Wt 176 lb (79.8 kg)   SpO2 98%   BMI 30.21 kg/m    Subjective:    Patient ID: Aimee Lawson, female    DOB: [birth year], 59 y.o.   MRN: [id number]  HPI: Aimee Lawson is a 59 y.o. female  Chief Complaint  Patient presents with  . Results    discuss CT results   COPD Recent low dose lung screening noted centrilobular and paraseptal emphysema + aortic atherosclerosis.  Currently has Symbicort she only uses when she has a cold and Albuterol too.  She does smoke -- averages about 1 PPD, has smoked since she was 15.  She has taken Chantix before without ADR, but felt it was not always helpful.    Discussed options for inhaler regimen with emphysema, she may benefit from daily LABA/LAMA as reports lots of mucus.  Discussed aortic atherosclerosis and preventative care with this: Baby ASA daily and statin + smoking cessation.  Discussed progressive nature of emphysema, but ways to slow down progression such as smoking cessation.   COPD status: stable Satisfied with current treatment?: no Oxygen use: no Dyspnea frequency: none Cough frequency: occasional Rescue inhaler frequency:  none Limitation of activity: no Productive cough: none Last Spirometry: today with FEV1 66% and FEV1/FVC 83% Pneumovax: Not up to Date -- discuss further next visit Influenza: Up to Date   Relevant past medical, surgical, family and social history reviewed and updated as indicated. Interim medical history since our last visit reviewed. Allergies and medications reviewed and updated.  Review of Systems  Constitutional: Negative for activity change, appetite change, diaphoresis, fatigue and fever.  Respiratory: Negative for cough, chest tightness, shortness of breath and wheezing.   Cardiovascular: Negative for chest pain, palpitations and leg swelling.  Gastrointestinal: Negative.   Neurological: Negative.   Psychiatric/Behavioral: Negative.       Per HPI unless specifically indicated above     Objective:    BP 115/72   Pulse 75   Temp 97.6 F (36.4 C) (Oral)   Wt 176 lb (79.8 kg)   SpO2 98%   BMI 30.21 kg/m   Wt Readings from Last 3 Encounters:  01/01/20 176 lb (79.8 kg)  12/16/19 173 lb (78.5 kg)  11/11/19 172 lb (78 kg)    Physical Exam Vitals and nursing note reviewed.  Constitutional:      General: She is awake. She is not in acute distress.    Appearance: She is well-developed, well-groomed and overweight. She is not ill-appearing.  HENT:     Head: Normocephalic.     Right Ear: Hearing normal.     Left Ear: Hearing normal.  Eyes:     General: Lids are normal.        Right eye: No discharge.        Left eye: No discharge.     Conjunctiva/sclera: Conjunctivae normal.     Pupils: Pupils are equal, round, and reactive to light.  Neck:     Vascular: No carotid bruit.  Cardiovascular:     Rate and Rhythm: Normal rate and regular rhythm.     Heart sounds: Normal heart sounds. No murmur heard.  No gallop.   Pulmonary:     Effort: Pulmonary effort is normal. No accessory muscle usage or respiratory distress.     Breath sounds: Normal breath sounds.  Abdominal:     General: Bowel sounds are normal.  Palpations: Abdomen is soft. There is no hepatomegaly or splenomegaly.  Musculoskeletal:     Cervical back: Normal range of motion and neck supple.     Right lower leg: No edema.     Left lower leg: No edema.  Skin:    General: Skin is warm and dry.  Neurological:     Mental Status: She is alert and oriented to person, place, and time.  Psychiatric:        Attention and Perception: Attention normal.        Mood and Affect: Mood normal.        Speech: Speech normal.        Behavior: Behavior normal. Behavior is cooperative.        Thought Content: Thought content normal.     Results for orders placed or performed in visit on 09/29/19  HM MAMMOGRAPHY  Result Value Ref Range   HM Mammogram 0-4  Bi-Rad 0-4 Bi-Rad, Self Reported Normal  Lipid Panel w/o Chol/HDL Ratio  Result Value Ref Range   Cholesterol, Total 206 (H) 100 - 199 mg/dL   Triglycerides 114 0 - 149 mg/dL   HDL 78 >39 mg/dL   VLDL Cholesterol Cal 20 5 - 40 mg/dL   LDL Chol Calc (NIH) 108 (H) 0 - 99 mg/dL  Comprehensive metabolic panel  Result Value Ref Range   Glucose 79 65 - 99 mg/dL   BUN 16 8 - 27 mg/dL   Creatinine, Ser 0.79 0.57 - 1.00 mg/dL   GFR calc non Af Amer 81 >59 mL/min/1.73   GFR calc Af Amer 93 >59 mL/min/1.73   BUN/Creatinine Ratio 20 12 - 28   Sodium 141 134 - 144 mmol/L   Potassium 4.2 3.5 - 5.2 mmol/L   Chloride 103 96 - 106 mmol/L   CO2 26 20 - 29 mmol/L   Calcium 9.2 8.7 - 10.3 mg/dL   Total Protein 6.5 6.0 - 8.5 g/dL   Albumin 4.4 3.8 - 4.8 g/dL   Globulin, Total 2.1 1.5 - 4.5 g/dL   Albumin/Globulin Ratio 2.1 1.2 - 2.2   Bilirubin Total 0.6 0.0 - 1.2 mg/dL   Alkaline Phosphatase 98 39 - 117 IU/L   AST 14 0 - 40 IU/L   ALT 14 0 - 32 IU/L  Hepatitis C antibody  Result Value Ref Range   Hep C Virus Ab <0.1 0.0 - 0.9 s/co ratio  Hepatitis A antibody, IgM  Result Value Ref Range   Hep A IgM Negative Negative  CBC with Differential/Platelet  Result Value Ref Range   WBC 6.2 3.4 - 10.8 x10E3/uL   RBC 4.53 3.77 - 5.28 x10E6/uL   Hemoglobin 14.3 11.1 - 15.9 g/dL   Hematocrit 42.5 34.0 - 46.6 %   MCV 94 79 - 97 fL   MCH 31.6 26.6 - 33.0 pg   MCHC 33.6 31 - 35 g/dL   RDW 12.1 11.7 - 15.4 %   Platelets 207 150 - 450 x10E3/uL   Neutrophils 50 Not Estab. %   Lymphs 42 Not Estab. %   Monocytes 7 Not Estab. %   Eos 1 Not Estab. %   Basos 0 Not Estab. %   Neutrophils Absolute 3.1 1 - 7 x10E3/uL   Lymphocytes Absolute 2.6 0 - 3 x10E3/uL   Monocytes Absolute 0.4 0 - 0 x10E3/uL   EOS (ABSOLUTE) 0.1 0.0 - 0.4 x10E3/uL   Basophils Absolute 0.0 0 - 0 x10E3/uL   Immature Granulocytes 0 Not Estab. %  Immature Grans (Abs) 0.0 0.0 - 0.1 x10E3/uL  TSH  Result Value Ref Range   TSH 1.960  0.450 - 4.500 uIU/mL  HIV Antibody (routine testing w rflx)  Result Value Ref Range   HIV Screen 4th Generation wRfx Non Reactive Non Reactive      Assessment & Plan:   Problem List Items Addressed This Visit      Cardiovascular and Mediastinum   Aortic atherosclerosis ([place])    Noted on June 2021 lung CT screening.  Discussed at length with patient preventative options.  Script sent for Atorvastatin 10 MG and she has started taking daily Baby ASA 81 MG.  Recommend complete cessation smoking -- she will start Chantix and patches over upcoming months -- scripts sent and she will determine stop date for smoking.      Relevant Medications   atorvastatin (LIPITOR) 10 MG tablet   aspirin 81 MG chewable tablet     Respiratory   Centrilobular emphysema ([place]) - Primary    Noted on lung CT screening June 2021.  Today  FEV1 66% and FEV1/FVC 83%.  Discussed treatment options at length with patient, she does endorse lots of mucus at baseline.  Will initiate LABA/LAMA, script for Anoro sent and educated her on this -- to consistently use daily and then use Albuterol ONLY as needed for SOB or wheezing.  Educated her on emphysema and progressive nature, ways to slow down progression, including smoking cessation.  She is going to set quit date for smoking in upcoming months and scripts were sent for Chantix and patches.  Return in 9 weeks.      Relevant Medications   umeclidinium-vilanterol (ANORO ELLIPTA) 62.5-25 MCG/INH AEPB   varenicline (CHANTIX STARTING MONTH PAK) 0.5 MG X 11 & 1 MG X 42 tablet   nicotine (NICODERM CQ - DOSED IN MG/24 HOURS) 21 mg/24hr patch   Other Relevant Orders   Spirometry with graph (Completed)     Other   Cigarette nicotine dependence without complication    Scripts sent for Chantix and patches, she will set quit date for upcoming months when her new house is completed and has less stressors.  Recommend complete cessation.      Relevant Medications   varenicline  (CHANTIX STARTING MONTH PAK) 0.5 MG X 11 & 1 MG X 42 tablet   nicotine (NICODERM CQ - DOSED IN MG/24 HOURS) 21 mg/24hr patch   Elevated LDL cholesterol level    Noted on recent labs, educated at length.  Script sent for Atorvastatin 10 MG daily.  Will plan to follow-up in 9 weeks in office and check labs.  Recommend diet changes and modest loss.      BMI 29.0-29.9,adult    Recommended eating smaller high protein, low fat meals more frequently and exercising 30 mins a day 5 times a week with a goal of 10-15lb weight loss in the next 3 months. Patient voiced their understanding and motivation to adhere to these recommendations.           Follow up plan: Return in about 9 weeks (around 03/04/2020) for COPD and HLD.

## 2020-01-01 NOTE — Patient Instructions (Signed)
Chronic Obstructive Pulmonary Disease Chronic obstructive pulmonary disease (COPD) is a long-term (chronic) lung problem. When you have COPD, it is hard for air to get in and out of your lungs. Usually the condition gets worse over time, and your lungs will never return to normal. There are things you can do to keep yourself as healthy as possible.  Your doctor may treat your condition with: ? Medicines. ? Oxygen. ? Lung surgery.  Your doctor may also recommend: ? Rehabilitation. This includes steps to make your body work better. It may involve a team of specialists. ? Quitting smoking, if you smoke. ? Exercise and changes to your diet. ? Comfort measures (palliative care). Follow these instructions at home: Medicines  Take over-the-counter and prescription medicines only as told by your doctor.  Talk to your doctor before taking any cough or allergy medicines. You may need to avoid medicines that cause your lungs to be dry. Lifestyle  If you smoke, stop. Smoking makes the problem worse. If you need help quitting, ask your doctor.  Avoid being around things that make your breathing worse. This may include smoke, chemicals, and fumes.  Stay active, but remember to rest as well.  Learn and use tips on how to relax.  Make sure you get enough sleep. Most adults need at least 7 hours of sleep every night.  Eat healthy foods. Eat smaller meals more often. Rest before meals. Controlled breathing Learn and use tips on how to control your breathing as told by your doctor. Try:  Breathing in (inhaling) through your nose for 1 second. Then, pucker your lips and breath out (exhale) through your lips for 2 seconds.  Putting one hand on your belly (abdomen). Breathe in slowly through your nose for 1 second. Your hand on your belly should move out. Pucker your lips and breathe out slowly through your lips. Your hand on your belly should move in as you breathe out.  Controlled coughing Learn  and use controlled coughing to clear mucus from your lungs. Follow these steps: 1. Lean your head a little forward. 2. Breathe in deeply. 3. Try to hold your breath for 3 seconds. 4. Keep your mouth slightly open while coughing 2 times. 5. Spit any mucus out into a tissue. 6. Rest and do the steps again 1 or 2 times as needed. General instructions  Make sure you get all the shots (vaccines) that your doctor recommends. Ask your doctor about a flu shot and a pneumonia shot.  Use oxygen therapy and pulmonary rehabilitation if told by your doctor. If you need home oxygen therapy, ask your doctor if you should buy a tool to measure your oxygen level (oximeter).  Make a COPD action plan with your doctor. This helps you to know what to do if you feel worse than usual.  Manage any other conditions you have as told by your doctor.  Avoid going outside when it is very hot, cold, or humid.  Avoid people who have a sickness you can catch (contagious).  Keep all follow-up visits as told by your doctor. This is important. Contact a doctor if:  You cough up more mucus than usual.  There is a change in the color or thickness of the mucus.  It is harder to breathe than usual.  Your breathing is faster than usual.  You have trouble sleeping.  You need to use your medicines more often than usual.  You have trouble doing your normal activities such as getting dressed   or walking around the house. Get help right away if:  You have shortness of breath while resting.  You have shortness of breath that stops you from: ? Being able to talk. ? Doing normal activities.  Your chest hurts for longer than 5 minutes.  Your skin color is more blue than usual.  Your pulse oximeter shows that you have low oxygen for longer than 5 minutes.  You have a fever.  You feel too tired to breathe normally. Summary  Chronic obstructive pulmonary disease (COPD) is a long-term lung problem.  The way your  lungs work will never return to normal. Usually the condition gets worse over time. There are things you can do to keep yourself as healthy as possible.  Take over-the-counter and prescription medicines only as told by your doctor.  If you smoke, stop. Smoking makes the problem worse. This information is not intended to replace advice given to you by your health care provider. Make sure you discuss any questions you have with your health care provider. Document Revised: 06/07/2017 Document Reviewed: 07/30/2016 Elsevier Patient Education  2020 [employer] Inc[employer]  

## 2020-01-01 NOTE — Assessment & Plan Note (Signed)
Noted on lung CT screening June 2021.  Today  FEV1 66% and FEV1/FVC 83%.  Discussed treatment options at length with patient, she does endorse lots of mucus at baseline.  Will initiate LABA/LAMA, script for Anoro sent and educated her on this -- to consistently use daily and then use Albuterol ONLY as needed for SOB or wheezing.  Educated her on emphysema and progressive nature, ways to slow down progression, including smoking cessation.  She is going to set quit date for smoking in upcoming months and scripts were sent for Chantix and patches.  Return in 9 weeks.

## 2020-01-01 NOTE — Assessment & Plan Note (Signed)
Noted on June 2021 lung CT screening.  Discussed at length with patient preventative options.  Script sent for Atorvastatin 10 MG and she has started taking daily Baby ASA 81 MG.  Recommend complete cessation smoking -- she will start Chantix and patches over upcoming months -- scripts sent and she will determine stop date for smoking.

## 2020-03-03 ENCOUNTER — Ambulatory Visit (INDEPENDENT_AMBULATORY_CARE_PROVIDER_SITE_OTHER): Payer: 59 | Admitting: Nurse Practitioner

## 2020-03-03 ENCOUNTER — Encounter: Payer: Self-pay | Admitting: Nurse Practitioner

## 2020-03-03 ENCOUNTER — Other Ambulatory Visit: Payer: Self-pay

## 2020-03-03 VITALS — BP 126/72 | HR 80 | Temp 98.3°F | Wt 174.0 lb

## 2020-03-03 DIAGNOSIS — I700 Atherosclerosis of aorta: Secondary | ICD-10-CM

## 2020-03-03 DIAGNOSIS — E782 Mixed hyperlipidemia: Secondary | ICD-10-CM

## 2020-03-03 DIAGNOSIS — J432 Centrilobular emphysema: Secondary | ICD-10-CM | POA: Diagnosis not present

## 2020-03-03 MED ORDER — BUPROPION HCL ER (SR) 150 MG PO TB12
ORAL_TABLET | ORAL | 2 refills | Status: DC
Start: 2020-03-03 — End: 2020-04-27

## 2020-03-03 NOTE — Assessment & Plan Note (Signed)
Ongoing.  Noted on lung CT screening June 2021.  Last visit FEV1 66% and FEV1/FVC 83%.  Continue Anoro at this time, may adjust due to cost -- to consistently use daily and then use Albuterol ONLY as needed for SOB or wheezing.  Educated her on emphysema and progressive nature, ways to slow down progression, including smoking cessation.  She is going to set quit date for smoking in upcoming months and scripts were sent for Wellbutrin and patches.  Recommend she trial daily Claritin or Allegra at home, possible postnasal drainage.  Return in 8 weeks

## 2020-03-03 NOTE — Assessment & Plan Note (Signed)
New diagnosis and tolerating statin started recently.  Continue Atorvastatin 10 MG daily.  Check lipid panel and CMP today.  Recommend diet changes and modest loss.

## 2020-03-03 NOTE — Progress Notes (Signed)
BP 126/72   Pulse 80   Temp 98.3 F (36.8 C) (Oral)   Wt 174 lb (78.9 kg)   SpO2 97%   BMI 29.87 kg/m    Subjective:    Patient ID: Aimee Lawson, female    DOB: 12-15-1960, 59 y.o.   MRN: [id number]  HPI: Aimee Lawson is a 59 y.o. female  Chief Complaint  Patient presents with  . COPD  . Hyperlipidemia  . Nicotine Dependence    pt states that chantix was recalled, wants to see about starting Wellbutrin   COPD Recent low dose lung screening noted centrilobular and paraseptal emphysema + aortic atherosclerosis.  Started on Anoro last visit for maintenance with her emphysema, uses Albuterol as needed.  Continues to report some mucus build up in throat and clearing throat often.  She does smoke -- averages about 1 PPD, has smoked since she was 15.  Chantix was recalled, would like to try Wellbutrin -- starting on 5th to quit.   COPD status: stable Satisfied with current treatment?: no Oxygen use: no Dyspnea frequency: none Cough frequency: occasional Rescue inhaler frequency:  none Limitation of activity: no Productive cough: none Last Spirometry: 01/01/20 FEV1 66% and FEV1/FVC 83% Pneumovax: Not up to Date -- discuss further next visit Influenza: Up to Date   HYPERLIPIDEMIA Started Atorvastatin last visit for prevention, reports no ADR with this.   Hyperlipidemia status: good compliance Satisfied with current treatment?  yes Side effects:  no Medication compliance: good compliance Supplements: none Aspirin:  yes The 10-year ASCVD risk score Mikey Bussing DC Jr., et al., 2013) is: 6%   Values used to calculate the score:     Age: 49 years     Sex: Female     Is Non-Hispanic African American: No     Diabetic: No     Tobacco smoker: Yes     Systolic Blood Pressure: 149 mmHg     Is BP treated: No     HDL Cholesterol: 78 mg/dL     Total Cholesterol: 206 mg/dL Chest pain:  no Coronary artery disease:  no Family history CAD: yes Family history early CAD:   yes  Relevant past medical, surgical, family and social history reviewed and updated as indicated. Interim medical history since our last visit reviewed. Allergies and medications reviewed and updated.  Review of Systems  Constitutional: Negative for activity change, appetite change, diaphoresis, fatigue and fever.  Respiratory: Negative for cough, chest tightness, shortness of breath and wheezing.   Cardiovascular: Negative for chest pain, palpitations and leg swelling.  Gastrointestinal: Negative.   Neurological: Negative.   Psychiatric/Behavioral: Negative.     Per HPI unless specifically indicated above     Objective:    BP 126/72   Pulse 80   Temp 98.3 F (36.8 C) (Oral)   Wt 174 lb (78.9 kg)   SpO2 97%   BMI 29.87 kg/m   Wt Readings from Last 3 Encounters:  03/03/20 174 lb (78.9 kg)  01/01/20 176 lb (79.8 kg)  12/16/19 173 lb (78.5 kg)    Physical Exam Vitals and nursing note reviewed.  Constitutional:      General: She is awake. She is not in acute distress.    Appearance: She is well-developed, well-groomed and overweight. She is not ill-appearing.  HENT:     Head: Normocephalic.     Right Ear: Hearing normal.     Left Ear: Hearing normal.  Eyes:     General: Lids are normal.  Right eye: No discharge.        Left eye: No discharge.     Conjunctiva/sclera: Conjunctivae normal.     Pupils: Pupils are equal, round, and reactive to light.  Neck:     Vascular: No carotid bruit.  Cardiovascular:     Rate and Rhythm: Normal rate and regular rhythm.     Heart sounds: Normal heart sounds. No murmur heard.  No gallop.   Pulmonary:     Effort: Pulmonary effort is normal. No accessory muscle usage or respiratory distress.     Breath sounds: Normal breath sounds.  Abdominal:     General: Bowel sounds are normal.     Palpations: Abdomen is soft. There is no hepatomegaly or splenomegaly.  Musculoskeletal:     Cervical back: Normal range of motion and neck  supple.     Right lower leg: No edema.     Left lower leg: No edema.  Skin:    General: Skin is warm and dry.  Neurological:     Mental Status: She is alert and oriented to person, place, and time.  Psychiatric:        Attention and Perception: Attention normal.        Mood and Affect: Mood normal.        Speech: Speech normal.        Behavior: Behavior normal. Behavior is cooperative.        Thought Content: Thought content normal.     Results for orders placed or performed in visit on 09/29/19  HM MAMMOGRAPHY  Result Value Ref Range   HM Mammogram 0-4 Bi-Rad 0-4 Bi-Rad, Self Reported Normal  Lipid Panel w/o Chol/HDL Ratio  Result Value Ref Range   Cholesterol, Total 206 (H) 100 - 199 mg/dL   Triglycerides 114 0 - 149 mg/dL   HDL 78 >39 mg/dL   VLDL Cholesterol Cal 20 5 - 40 mg/dL   LDL Chol Calc (NIH) 108 (H) 0 - 99 mg/dL  Comprehensive metabolic panel  Result Value Ref Range   Glucose 79 65 - 99 mg/dL   BUN 16 8 - 27 mg/dL   Creatinine, Ser 0.79 0.57 - 1.00 mg/dL   GFR calc non Af Amer 81 >59 mL/min/1.73   GFR calc Af Amer 93 >59 mL/min/1.73   BUN/Creatinine Ratio 20 12 - 28   Sodium 141 134 - 144 mmol/L   Potassium 4.2 3.5 - 5.2 mmol/L   Chloride 103 96 - 106 mmol/L   CO2 26 20 - 29 mmol/L   Calcium 9.2 8.7 - 10.3 mg/dL   Total Protein 6.5 6.0 - 8.5 g/dL   Albumin 4.4 3.8 - 4.8 g/dL   Globulin, Total 2.1 1.5 - 4.5 g/dL   Albumin/Globulin Ratio 2.1 1.2 - 2.2   Bilirubin Total 0.6 0.0 - 1.2 mg/dL   Alkaline Phosphatase 98 39 - 117 IU/L   AST 14 0 - 40 IU/L   ALT 14 0 - 32 IU/L  Hepatitis C antibody  Result Value Ref Range   Hep C Virus Ab <0.1 0.0 - 0.9 s/co ratio  Hepatitis A antibody, IgM  Result Value Ref Range   Hep A IgM Negative Negative  CBC with Differential/Platelet  Result Value Ref Range   WBC 6.2 3.4 - 10.8 x10E3/uL   RBC 4.53 3.77 - 5.28 x10E6/uL   Hemoglobin 14.3 11.1 - 15.9 g/dL   Hematocrit 42.5 34.0 - 46.6 %   MCV 94 79 - 97 fL   MCH  31.6 26.6 - 33.0 pg   MCHC 33.6 31 - 35 g/dL   RDW 12.1 11.7 - 15.4 %   Platelets 207 150 - 450 x10E3/uL   Neutrophils 50 Not Estab. %   Lymphs 42 Not Estab. %   Monocytes 7 Not Estab. %   Eos 1 Not Estab. %   Basos 0 Not Estab. %   Neutrophils Absolute 3.1 1 - 7 x10E3/uL   Lymphocytes Absolute 2.6 0 - 3 x10E3/uL   Monocytes Absolute 0.4 0 - 0 x10E3/uL   EOS (ABSOLUTE) 0.1 0.0 - 0.4 x10E3/uL   Basophils Absolute 0.0 0 - 0 x10E3/uL   Immature Granulocytes 0 Not Estab. %   Immature Grans (Abs) 0.0 0.0 - 0.1 x10E3/uL  TSH  Result Value Ref Range   TSH 1.960 0.450 - 4.500 uIU/mL  HIV Antibody (routine testing w rflx)  Result Value Ref Range   HIV Screen 4th Generation wRfx Non Reactive Non Reactive      Assessment & Plan:   Problem List Items Addressed This Visit      Cardiovascular and Mediastinum   Aortic atherosclerosis ([place])    Noted on June 2021 lung CT screening.  Continue statin and ASA daily for prevention purposes.  Recommend complete cessation of smoking.        Respiratory   Centrilobular emphysema ([place]) - Primary    Ongoing.  Noted on lung CT screening June 2021.  Last visit FEV1 66% and FEV1/FVC 83%.  Continue Anoro at this time, may adjust due to cost -- to consistently use daily and then use Albuterol ONLY as needed for SOB or wheezing.  Educated her on emphysema and progressive nature, ways to slow down progression, including smoking cessation.  She is going to set quit date for smoking in upcoming months and scripts were sent for Wellbutrin and patches.  Recommend she trial daily Claritin or Allegra at home, possible postnasal drainage.  Return in 8 weeks        Other   Hyperlipemia    New diagnosis and tolerating statin started recently.  Continue Atorvastatin 10 MG daily.  Check lipid panel and CMP today.  Recommend diet changes and modest loss.       Relevant Orders   Lipid Panel w/o Chol/HDL Ratio   Comprehensive metabolic panel       Follow up  plan: Return in about 8 weeks (around 04/28/2020) for Smoking cessation.

## 2020-03-03 NOTE — Patient Instructions (Signed)

## 2020-03-03 NOTE — Assessment & Plan Note (Signed)
Noted on June 2021 lung CT screening.  Continue statin and ASA daily for prevention purposes.  Recommend complete cessation of smoking.

## 2020-03-04 LAB — COMPREHENSIVE METABOLIC PANEL
ALT: 20 IU/L (ref 0–32)
AST: 18 IU/L (ref 0–40)
Albumin/Globulin Ratio: 2.4 — ABNORMAL HIGH (ref 1.2–2.2)
Albumin: 4.6 g/dL (ref 3.8–4.8)
Alkaline Phosphatase: 104 IU/L (ref 48–121)
BUN/Creatinine Ratio: 24 (ref 12–28)
BUN: 24 mg/dL (ref 8–27)
Bilirubin Total: 0.8 mg/dL (ref 0.0–1.2)
CO2: 26 mmol/L (ref 20–29)
Calcium: 9.4 mg/dL (ref 8.7–10.3)
Chloride: 106 mmol/L (ref 96–106)
Creatinine, Ser: 0.99 mg/dL (ref 0.57–1.00)
GFR calc Af Amer: 71 mL/min/{1.73_m2} (ref >59)
GFR calc non Af Amer: 62 mL/min/{1.73_m2} (ref >59)
Globulin, Total: 1.9 g/dL (ref 1.5–4.5)
Glucose: 73 mg/dL (ref 65–99)
Potassium: 4.2 mmol/L (ref 3.5–5.2)
Sodium: 145 mmol/L — ABNORMAL HIGH (ref 134–144)
Total Protein: 6.5 g/dL (ref 6.0–8.5)

## 2020-03-04 LAB — LIPID PANEL W/O CHOL/HDL RATIO
Cholesterol, Total: 143 mg/dL (ref 100–199)
HDL: 69 mg/dL (ref >39)
LDL Chol Calc (NIH): 60 mg/dL (ref 0–99)
Triglycerides: 72 mg/dL (ref 0–149)
VLDL Cholesterol Cal: 14 mg/dL (ref 5–40)

## 2020-03-04 NOTE — Progress Notes (Signed)
Contacted via [employer] morning Aimee Lawson your labs have returned -- [facility]!!  Look at your cholesterol levels.  They are much improved -- LDL (bad cholesterol) went from 108 to now 60 -- below stroke prevention goal of <70.  Great job.  Total cholesterol went from 206 to 143, looks fantastic.  Kidney and liver function are stable.  Sodium (salt) level is slightly elevated, mild, I would increase your water intake and decrease salt intake -- will recheck this next visit.  Have a great day!! Keep being awesome!!  Thank you for allowing me to participate in your care. Kindest regards, Wilmont Olund

## 2020-03-29 ENCOUNTER — Ambulatory Visit: Payer: 59 | Admitting: Dermatology

## 2020-04-26 ENCOUNTER — Ambulatory Visit (INDEPENDENT_AMBULATORY_CARE_PROVIDER_SITE_OTHER): Payer: 59 | Admitting: Nurse Practitioner

## 2020-04-26 ENCOUNTER — Other Ambulatory Visit (HOSPITAL_COMMUNITY)
Admission: RE | Admit: 2020-04-26 | Discharge: 2020-04-26 | Disposition: A | Discharge status: Home / Self Care | Payer: 59 | Source: Ambulatory Visit | Attending: Nurse Practitioner | Admitting: Nurse Practitioner

## 2020-04-26 ENCOUNTER — Encounter: Payer: Self-pay | Admitting: Nurse Practitioner

## 2020-04-26 ENCOUNTER — Other Ambulatory Visit: Payer: Self-pay

## 2020-04-26 VITALS — BP 131/80 | HR 71 | Temp 98.4°F | Resp 16 | Ht 64.0 in | Wt 184.6 lb

## 2020-04-26 DIAGNOSIS — Z6831 Body mass index (BMI) 31.0-31.9, adult: Secondary | ICD-10-CM | POA: Diagnosis not present

## 2020-04-26 DIAGNOSIS — Z124 Encounter for screening for malignant neoplasm of cervix: Secondary | ICD-10-CM

## 2020-04-26 NOTE — Assessment & Plan Note (Signed)
Will place referral to weight management, she has attended there in past and is concerned with weight gain and her quitting smoking.  Discussed with her and would recommend not returning to smoking.  Recommended eating smaller high protein, low fat meals more frequently and exercising 30 mins a day 5 times a week with a goal of 10-15lb weight loss in the next 3 months. Patient voiced their understanding and motivation to adhere to these recommendations.Marland Kitchen

## 2020-04-26 NOTE — Patient Instructions (Signed)

## 2020-04-26 NOTE — Progress Notes (Signed)
BP 131/80 (BP Location: Right Arm, Patient Position: Sitting, Cuff Size: Normal)   Pulse 71   Temp 98.4 F (36.9 C) (Oral)   Resp 16   Ht 5\' 4"  (1.626 m)   Wt 184 lb 9.6 oz (83.7 kg)   SpO2 99%   BMI 31.69 kg/m    Subjective:    Patient ID: Aimee Lawson, female    DOB: 02-Feb-1961, 59 y.o.   MRN: [id number]  HPI: Aimee Lawson is a 59 y.o. female  Chief Complaint  Patient presents with  . Follow-up   Presents today for pap exam, last was over in 2018.  She denies any concerns with female reproductive health.  Is concerned about weight gain, has gained 10 pounds since quitting smoking.  She is concerned about weight, was 225 lbs in her 30's and does not want to return to that weight.  She reports she will smoke again if weight gain continues.  Would like to return to weight management - in past Phentermine, B12 and did well with these.    Relevant past medical, surgical, family and social history reviewed and updated as indicated. Interim medical history since our last visit reviewed. Allergies and medications reviewed and updated.  Review of Systems  Constitutional: Negative for activity change, appetite change, diaphoresis, fatigue and fever.  Respiratory: Negative for cough, chest tightness, shortness of breath and wheezing.   Cardiovascular: Negative for chest pain, palpitations and leg swelling.  Gastrointestinal: Negative.   Neurological: Negative.   Psychiatric/Behavioral: Negative.     Per HPI unless specifically indicated above     Objective:    BP 131/80 (BP Location: Right Arm, Patient Position: Sitting, Cuff Size: Normal)   Pulse 71   Temp 98.4 F (36.9 C) (Oral)   Resp 16   Ht 5\' 4"  (1.626 m)   Wt 184 lb 9.6 oz (83.7 kg)   SpO2 99%   BMI 31.69 kg/m   Wt Readings from Last 3 Encounters:  04/26/20 184 lb 9.6 oz (83.7 kg)  03/03/20 174 lb (78.9 kg)  01/01/20 176 lb (79.8 kg)    Physical Exam Vitals and nursing note reviewed.    Constitutional:      General: She is awake. She is not in acute distress.    Appearance: She is well-developed and well-groomed. She is obese. She is not ill-appearing.  HENT:     Head: Normocephalic.     Right Ear: Hearing normal.     Left Ear: Hearing normal.  Eyes:     General: Lids are normal.        Right eye: No discharge.        Left eye: No discharge.     Conjunctiva/sclera: Conjunctivae normal.     Pupils: Pupils are equal, round, and reactive to light.  Neck:     Vascular: No carotid bruit.  Cardiovascular:     Rate and Rhythm: Normal rate and regular rhythm.     Heart sounds: Normal heart sounds. No murmur heard.  No gallop.   Pulmonary:     Effort: Pulmonary effort is normal. No accessory muscle usage or respiratory distress.     Breath sounds: Normal breath sounds.  Abdominal:     General: Bowel sounds are normal.     Palpations: Abdomen is soft.     Hernia: There is no hernia in the left inguinal area or right inguinal area.  Genitourinary:    Exam position: Lithotomy position.     Labia:  Right: No rash.        Left: No rash.      Vagina: Normal.     Cervix: Normal.     Uterus: Normal.      Adnexa: Right adnexa normal and left adnexa normal.     Comments: Chaperone declined.  Pap obtained, cervix anterior and slightly to left -- no erythema or discharge noted.   Musculoskeletal:     Cervical back: Normal range of motion and neck supple.     Right lower leg: No edema.     Left lower leg: No edema.  Skin:    General: Skin is warm and dry.  Neurological:     Mental Status: She is alert and oriented to person, place, and time.  Psychiatric:        Attention and Perception: Attention normal.        Mood and Affect: Mood normal.        Speech: Speech normal.        Behavior: Behavior normal. Behavior is cooperative.        Thought Content: Thought content normal.     Results for orders placed or performed in visit on 03/03/20  Lipid Panel w/o  Chol/HDL Ratio  Result Value Ref Range   Cholesterol, Total 143 100 - 199 mg/dL   Triglycerides 72 0 - 149 mg/dL   HDL 69 >39 mg/dL   VLDL Cholesterol Cal 14 5 - 40 mg/dL   LDL Chol Calc (NIH) 60 0 - 99 mg/dL  Comprehensive metabolic panel  Result Value Ref Range   Glucose 73 65 - 99 mg/dL   BUN 24 8 - 27 mg/dL   Creatinine, Ser 0.99 0.57 - 1.00 mg/dL   GFR calc non Af Amer 62 >59 mL/min/1.73   GFR calc Af Amer 71 >59 mL/min/1.73   BUN/Creatinine Ratio 24 12 - 28   Sodium 145 (H) 134 - 144 mmol/L   Potassium 4.2 3.5 - 5.2 mmol/L   Chloride 106 96 - 106 mmol/L   CO2 26 20 - 29 mmol/L   Calcium 9.4 8.7 - 10.3 mg/dL   Total Protein 6.5 6.0 - 8.5 g/dL   Albumin 4.6 3.8 - 4.8 g/dL   Globulin, Total 1.9 1.5 - 4.5 g/dL   Albumin/Globulin Ratio 2.4 (H) 1.2 - 2.2   Bilirubin Total 0.8 0.0 - 1.2 mg/dL   Alkaline Phosphatase 104 48 - 121 IU/L   AST 18 0 - 40 IU/L   ALT 20 0 - 32 IU/L      Assessment & Plan:   Problem List Items Addressed This Visit      Other   BMI 31.0-31.9,adult - Primary    Will place referral to weight management, she has attended there in past and is concerned with weight gain and her quitting smoking.  Discussed with her and would recommend not returning to smoking.  Recommended eating smaller high protein, low fat meals more frequently and exercising 30 mins a day 5 times a week with a goal of 10-15lb weight loss in the next 3 months. Patient voiced their understanding and motivation to adhere to these recommendations..       Relevant Orders   Amb Ref to Medical Weight Management    Other Visit Diagnoses    Cervical cancer screening       Pap obtained and sent today.   Relevant Orders   Cytology - PAP       Follow up plan: Return in about  7 months (around 11/10/2020) for Annual physical.

## 2020-04-27 ENCOUNTER — Other Ambulatory Visit: Payer: Self-pay | Admitting: Nurse Practitioner

## 2020-04-27 NOTE — Telephone Encounter (Signed)
° °  Notes to clinic:  Patient requesting a 90 day supply Please review for change    Requested Prescriptions  Pending Prescriptions Disp Refills   buPROPion (WELLBUTRIN SR) 150 MG 12 hr tablet [Pharmacy Med Name: BUPROPION HCL SR 150 MG TABLET] 180 tablet 1    Sig: TAKE 1 TABLET BY MOUTH DAILY FOR 3 DAYS, THEN INCREASE TO 1 TABLET BY MOUTH TWICE DAILY      Psychiatry: Antidepressants - bupropion Passed - 04/27/2020  2:30 PM      Passed - Last BP in normal range    BP Readings from Last 1 Encounters:  04/26/20 131/80          Passed - Valid encounter within last 6 months    Recent Outpatient Visits           Yesterday BMI 31.0-31.9,adult   [facility] Tib[place]ones, Wiggins T, NP   1 month ago Centrilobular emphysema ([place])   [place] Cannady, Folcroft T, NP   3 months ago Centrilobular emphysema ([place])   [place] Cannady, Barbaraann Faster, NP   5 months ago Encounter for annual physical exam   [place] Daykin, Henrine Screws T, NP   7 months ago Encounter to establish care   [place], Barbaraann Faster, NP       Future Appointments             In 7 months Cannady, Barbaraann Faster, NP [employer]

## 2020-04-28 LAB — CYTOLOGY - PAP
Comment: NEGATIVE
Diagnosis: NEGATIVE
High risk HPV: NEGATIVE

## 2020-04-28 NOTE — Progress Notes (Signed)
Contacted via [place]  I have good news Aimee Lawson, your pap has returned and HPV is negative + pap showed no abnormal cells.  This means next pap can be performed in 5 years, at that time you will be 64 and if you wish to no longer have paps performed you can decided at that time as recommendation is to perform paps from age 62 to 7, I do have some patients who wish to continue them.  Have a great day!! Keep being awesome!!  Thank you for allowing me to participate in your care. Kindest regards, Rakesha Dalporto

## 2020-06-27 ENCOUNTER — Other Ambulatory Visit: Payer: Self-pay | Admitting: Nurse Practitioner

## 2020-07-28 ENCOUNTER — Ambulatory Visit (LOCAL_COMMUNITY_HEALTH_CENTER): Payer: 59

## 2020-07-28 ENCOUNTER — Other Ambulatory Visit: Payer: Self-pay

## 2020-07-28 DIAGNOSIS — Z23 Encounter for immunization: Secondary | ICD-10-CM | POA: Diagnosis not present

## 2020-07-28 NOTE — Progress Notes (Signed)
Traveling to [place]. Tolerated Twinrix well. Updated [facility] copy given. Josie Saunders, RN

## 2020-09-27 ENCOUNTER — Ambulatory Visit (LOCAL_COMMUNITY_HEALTH_CENTER): Payer: 59

## 2020-09-27 ENCOUNTER — Other Ambulatory Visit: Payer: Self-pay

## 2020-09-27 DIAGNOSIS — Z23 Encounter for immunization: Secondary | ICD-10-CM | POA: Diagnosis not present

## 2020-09-27 NOTE — Progress Notes (Signed)
Tolerated Twinrix #2 well today. Updated [facility] copy given. Josie Saunders, RN

## 2020-10-03 ENCOUNTER — Telehealth: Payer: Self-pay | Admitting: Nurse Practitioner

## 2020-10-03 NOTE — Telephone Encounter (Signed)
LVM TO MAKE THIS APT  

## 2020-10-03 NOTE — Telephone Encounter (Signed)
Patient is calling to request a referral to have her gall bladder checked.  She stated she is having issues and feels she needs a specialist.  CB# [phone]

## 2020-10-03 NOTE — Telephone Encounter (Signed)
Would need visit first please.

## 2020-10-04 ENCOUNTER — Encounter: Payer: Self-pay | Admitting: Nurse Practitioner

## 2020-10-04 ENCOUNTER — Other Ambulatory Visit: Payer: Self-pay

## 2020-10-04 ENCOUNTER — Ambulatory Visit (INDEPENDENT_AMBULATORY_CARE_PROVIDER_SITE_OTHER): Payer: 59 | Admitting: Nurse Practitioner

## 2020-10-04 VITALS — BP 113/75 | HR 78 | Temp 98.0°F | Wt 177.4 lb

## 2020-10-04 DIAGNOSIS — N3000 Acute cystitis without hematuria: Secondary | ICD-10-CM

## 2020-10-04 DIAGNOSIS — E6609 Other obesity due to excess calories: Secondary | ICD-10-CM

## 2020-10-04 DIAGNOSIS — Z6830 Body mass index (BMI) 30.0-30.9, adult: Secondary | ICD-10-CM

## 2020-10-04 LAB — URINALYSIS, ROUTINE W REFLEX MICROSCOPIC
Bilirubin, UA: NEGATIVE
Glucose, UA: NEGATIVE
Ketones, UA: NEGATIVE
Leukocytes,UA: NEGATIVE
Nitrite, UA: POSITIVE — AB
Protein,UA: NEGATIVE
RBC, UA: NEGATIVE
Specific Gravity, UA: 1.02 (ref 1.005–1.030)
Urobilinogen, Ur: 0.2 mg/dL (ref 0.2–1.0)
pH, UA: 5.5 (ref 5.0–7.5)

## 2020-10-04 LAB — WET PREP FOR TRICH, YEAST, CLUE
Clue Cell Exam: NEGATIVE
Trichomonas Exam: NEGATIVE
Yeast Exam: NEGATIVE

## 2020-10-04 LAB — MICROSCOPIC EXAMINATION
RBC, Urine: NONE SEEN /hpf (ref 0–2)
WBC, UA: NONE SEEN /hpf (ref 0–5)

## 2020-10-04 MED ORDER — NITROFURANTOIN MONOHYD MACRO 100 MG PO CAPS: 100.0000 mg | ORAL_CAPSULE | Freq: Two times a day (BID) | ORAL | 0 refills | Status: AC

## 2020-10-04 NOTE — Progress Notes (Signed)
BP 113/75   Pulse 78   Temp 98 F (36.7 C) (Oral)   Wt 177 lb 6.4 oz (80.5 kg)   LMP 09/07/2010 (Approximate)   SpO2 97%   BMI 30.45 kg/m    Subjective:    Patient ID: Aimee Lawson, female    DOB: [birth year], 60 y.o.   MRN: [id number]  HPI: Aimee Lawson is a 60 y.o. female  Chief Complaint  Patient presents with  . Abdominal Pain    Patient states she is having extreme cramping/pain that radiates around to back. Patient complains it happens on both sides. Patient states this current episode has been going on since Friday night. Patient states she had a hotdog and then Sunday she had [place] food and then had diarrhea.    ABDOMINAL PAIN  Started Friday afternoon, in middle of abdomen and around to back. Some mild nausea with this, but no vomiting.  No specific foods or changes prior to this.  Saturday morning it got worse, then it was able to ease off.  Laying down is worse, getting up and moving is better.  Eating at times causes discomfort.  Sunday morning was fine, ate [place] at lunch and then pain returned with diarrhea.  Taking Ibuprofen which relieves it.  Does have underlying issues with constipation for long while -- since childhood.  Has about 2 BM a week, when she gets to day 3 of no stool takes a laxative and will have 3 BM that day.  Does take Colace daily.    Has Cologuard at home, but has not performed -- no colonoscopy.  Occasional social alcohol use.  Continues to smoke, <1 PPD.   Duration:days Onset: sudden Severity: 8/10 at worst, an very mild at present Quality: extreme menstrual cramp feeling in middle of stomach Location:  diffuse  Episode duration: all day Radiation: yes to around back Frequency: intermittent Alleviating factors: Ibuprofen Aggravating factors: laying down Status: fluctuating Treatments attempted: Ibuprofen Fever: no Nausea: yes Vomiting: no Weight loss: no Decreased appetite: yes Diarrhea: x 1 episode Constipation:  yes Blood in stool: no Heartburn: no Jaundice: no Rash: no Dysuria/urinary frequency: no Hematuria: no History of sexually transmitted disease:  Recurrent NSAID use: at present  Relevant past medical, surgical, family and social history reviewed and updated as indicated. Interim medical history since our last visit reviewed. Allergies and medications reviewed and updated.  Review of Systems  Constitutional: Positive for appetite change. Negative for activity change, diaphoresis, fatigue and fever.  Respiratory: Negative.   Cardiovascular: Negative.   Gastrointestinal: Positive for abdominal pain, constipation, diarrhea (x 1 episode after [place] food) and nausea. Negative for abdominal distention, blood in stool and vomiting.  Neurological: Negative.   Psychiatric/Behavioral: Negative.     Per HPI unless specifically indicated above     Objective:    BP 113/75   Pulse 78   Temp 98 F (36.7 C) (Oral)   Wt 177 lb 6.4 oz (80.5 kg)   LMP 09/07/2010 (Approximate)   SpO2 97%   BMI 30.45 kg/m   Wt Readings from Last 3 Encounters:  10/04/20 177 lb 6.4 oz (80.5 kg)  04/26/20 184 lb 9.6 oz (83.7 kg)  03/03/20 174 lb (78.9 kg)    Physical Exam Vitals and nursing note reviewed.  Constitutional:      General: She is awake. She is not in acute distress.    Appearance: She is well-developed and well-groomed. She is obese. She is not ill-appearing.  HENT:  Head: Normocephalic.     Right Ear: Hearing normal.     Left Ear: Hearing normal.  Eyes:     General: Lids are normal.        Right eye: No discharge.        Left eye: No discharge.     Conjunctiva/sclera: Conjunctivae normal.     Pupils: Pupils are equal, round, and reactive to light.  Cardiovascular:     Rate and Rhythm: Normal rate and regular rhythm.     Heart sounds: Normal heart sounds. No murmur heard. No gallop.   Pulmonary:     Effort: Pulmonary effort is normal. No accessory muscle usage or respiratory  distress.     Breath sounds: Normal breath sounds.  Abdominal:     General: Bowel sounds are normal. There is no distension or abdominal bruit.     Palpations: Abdomen is soft. There is no hepatomegaly.     Tenderness: There is no abdominal tenderness. There is no right CVA tenderness, left CVA tenderness or guarding.  Musculoskeletal:     Cervical back: Normal range of motion and neck supple.     Right lower leg: No edema.     Left lower leg: No edema.  Skin:    General: Skin is warm and dry.  Neurological:     Mental Status: She is alert and oriented to person, place, and time.  Psychiatric:        Attention and Perception: Attention normal.        Mood and Affect: Mood normal.        Speech: Speech normal.        Behavior: Behavior normal. Behavior is cooperative.        Thought Content: Thought content normal.     Results for orders placed or performed in visit on 04/26/20  Cytology - PAP  Result Value Ref Range   High risk HPV Negative    Adequacy      Satisfactory for evaluation; transformation zone component PRESENT.   Diagnosis      - Negative for intraepithelial lesion or malignancy (NILM)   Comment Normal Reference Range HPV - Negative       Assessment & Plan:   Problem List Items Addressed This Visit      Genitourinary   Acute cystitis without hematuria - Primary    Acute with wet prep negative and UA noting nitrites + and many bacteria, negative BLD.  At this time will treat for UTI with Macrobid 100 MG BID x 5 days, send urine for culture and will adjust regimen if needed.  Recommend increased fluid intake at home and working on underlying constipation issues with change to Senna-S daily.  Plan for return to office in 2 weeks for recheck.      Relevant Orders   Urinalysis, Routine w reflex microscopic   WET PREP FOR TRICH, YEAST, CLUE   CBC with Differential/Platelet   Comprehensive metabolic panel   Urine Culture     Other   Obesity    BMI 30.45, has  had some loss.  Recommended eating smaller high protein, low fat meals more frequently and exercising 30 mins a day 5 times a week with a goal of 10-15lb weight loss in the next 3 months. Patient voiced their understanding and motivation to adhere to these recommendations.           Follow up plan: Return in about 2 weeks (around 10/18/2020) for UTI recheck.

## 2020-10-04 NOTE — Assessment & Plan Note (Signed)
BMI 30.45, has had some loss.  Recommended eating smaller high protein, low fat meals more frequently and exercising 30 mins a day 5 times a week with a goal of 10-15lb weight loss in the next 3 months. Patient voiced their understanding and motivation to adhere to these recommendations.

## 2020-10-04 NOTE — Patient Instructions (Signed)
Urinary Tract Infection, Adult A urinary tract infection (UTI) is an infection of any part of the urinary tract. The urinary tract includes:  The kidneys.  The ureters.  The bladder.  The urethra. These organs make, store, and get rid of pee (urine) in the body. What are the causes? This infection is caused by germs (bacteria) in your genital area. These germs grow and cause swelling (inflammation) of your urinary tract. What increases the risk? The following factors may make you more likely to develop this condition:  Using a small, thin tube (catheter) to drain pee.  Not being able to control when you pee or poop (incontinence).  Being female. If you are female, these things can increase the risk: ? Using these methods to prevent pregnancy:  A medicine that kills sperm (spermicide).  A device that blocks sperm (diaphragm). ? Having low levels of a female hormone (estrogen). ? Being pregnant. You are more likely to develop this condition if:  You have genes that add to your risk.  You are sexually active.  You take antibiotic medicines.  You have trouble peeing because of: ? A prostate that is bigger than normal, if you are female. ? A blockage in the part of your body that drains pee from the bladder. ? A kidney stone. ? A nerve condition that affects your bladder. ? Not getting enough to drink. ? Not peeing often enough.  You have other conditions, such as: ? Diabetes. ? A weak disease-fighting system (immune system). ? Sickle cell disease. ? Gout. ? Injury of the spine. What are the signs or symptoms? Symptoms of this condition include:  Needing to pee right away.  Peeing small amounts often.  Pain or burning when peeing.  Blood in the pee.  Pee that smells bad or not like normal.  Trouble peeing.  Pee that is cloudy.  Fluid coming from the vagina, if you are female.  Pain in the belly or lower back. Other symptoms include:  Vomiting.  Not  feeling hungry.  Feeling mixed up (confused). This may be the first symptom in older adults.  Being tired and grouchy (irritable).  A fever.  Watery poop (diarrhea). How is this treated?  Taking antibiotic medicine.  Taking other medicines.  Drinking enough water. In some cases, you may need to see a specialist. Follow these instructions at home: Medicines  Take over-the-counter and prescription medicines only as told by your doctor.  If you were prescribed an antibiotic medicine, take it as told by your doctor. Do not stop taking it even if you start to feel better. General instructions  Make sure you: ? Pee until your bladder is empty. ? Do not hold pee for a long time. ? Empty your bladder after sex. ? Wipe from front to back after peeing or pooping if you are a female. Use each tissue one time when you wipe.  Drink enough fluid to keep your pee pale yellow.  Keep all follow-up visits.   Contact a doctor if:  You do not get better after 1-2 days.  Your symptoms go away and then come back. Get help right away if:  You have very bad back pain.  You have very bad pain in your lower belly.  You have a fever.  You have chills.  You feeling like you will vomit or you vomit. Summary  A urinary tract infection (UTI) is an infection of any part of the urinary tract.  This condition is caused by   germs in your genital area.  There are many risk factors for a UTI.  Treatment includes antibiotic medicines.  Drink enough fluid to keep your pee pale yellow. This information is not intended to replace advice given to you by your health care provider. Make sure you discuss any questions you have with your health care provider. Document Revised: 02/05/2020 Document Reviewed: 02/05/2020 Elsevier Patient Education  2021 Elsevier Inc.  

## 2020-10-04 NOTE — Assessment & Plan Note (Signed)
Acute with wet prep negative and UA noting nitrites + and many bacteria, negative BLD.  At this time will treat for UTI with Macrobid 100 MG BID x 5 days, send urine for culture and will adjust regimen if needed.  Recommend increased fluid intake at home and working on underlying constipation issues with change to Senna-S daily.  Plan for return to office in 2 weeks for recheck.

## 2020-10-05 LAB — CBC WITH DIFFERENTIAL/PLATELET
Basophils Absolute: 0 10*3/uL (ref 0.0–0.2)
Basos: 1 %
EOS (ABSOLUTE): 0.1 10*3/uL (ref 0.0–0.4)
Eos: 2 %
Hematocrit: 40.2 % (ref 34.0–46.6)
Hemoglobin: 13.7 g/dL (ref 11.1–15.9)
Immature Grans (Abs): 0 10*3/uL (ref 0.0–0.1)
Immature Granulocytes: 0 %
Lymphocytes Absolute: 1.9 10*3/uL (ref 0.7–3.1)
Lymphs: 38 %
MCH: 32 pg (ref 26.6–33.0)
MCHC: 34.1 g/dL (ref 31.5–35.7)
MCV: 94 fL (ref 79–97)
Monocytes Absolute: 0.3 10*3/uL (ref 0.1–0.9)
Monocytes: 6 %
Neutrophils Absolute: 2.6 10*3/uL (ref 1.4–7.0)
Neutrophils: 53 %
Platelets: 214 10*3/uL (ref 150–450)
RBC: 4.28 x10E6/uL (ref 3.77–5.28)
RDW: 12.1 % (ref 11.7–15.4)
WBC: 4.9 10*3/uL (ref 3.4–10.8)

## 2020-10-05 LAB — COMPREHENSIVE METABOLIC PANEL
ALT: 11 IU/L (ref 0–32)
AST: 18 IU/L (ref 0–40)
Albumin/Globulin Ratio: 2.4 — ABNORMAL HIGH (ref 1.2–2.2)
Albumin: 4.5 g/dL (ref 3.8–4.8)
Alkaline Phosphatase: 76 IU/L (ref 44–121)
BUN/Creatinine Ratio: 14 (ref 12–28)
BUN: 15 mg/dL (ref 8–27)
Bilirubin Total: 0.7 mg/dL (ref 0.0–1.2)
CO2: 21 mmol/L (ref 20–29)
Calcium: 9.3 mg/dL (ref 8.7–10.3)
Chloride: 103 mmol/L (ref 96–106)
Creatinine, Ser: 1.05 mg/dL — ABNORMAL HIGH (ref 0.57–1.00)
Globulin, Total: 1.9 g/dL (ref 1.5–4.5)
Glucose: 72 mg/dL (ref 65–99)
Potassium: 4.2 mmol/L (ref 3.5–5.2)
Sodium: 142 mmol/L (ref 134–144)
Total Protein: 6.4 g/dL (ref 6.0–8.5)
eGFR: 60 mL/min/{1.73_m2} (ref >59)

## 2020-10-05 NOTE — Progress Notes (Signed)
Contacted via MyChart   Good morning Machaela, overall your labs are remaining stable with no major concerns noted.  Your creatinine was mildly elevated, would recommend increasing fluid intake.  Any questions?  Let me know if any worsening symptoms present.  Have a great day!! Keep being awesome!!  Thank you for allowing me to participate in your care. Kindest regards, Laquentin Loudermilk

## 2020-10-06 LAB — URINE CULTURE

## 2020-10-06 NOTE — Progress Notes (Signed)
Contacted via [place] evening Aimee Lawson, your urine culture returned and you did have a urine infection present with >100,000 growth.  It is susceptible to Macrobid (Nitrofurantoin), which we are treating you with.  Take this until completed and we will recheck next visit.  Any questions? Keep being awesome!!  Thank you for allowing me to participate in your care. Kindest regards, Doyne Ellinger

## 2020-10-18 ENCOUNTER — Ambulatory Visit: Payer: 59 | Admitting: Nurse Practitioner

## 2020-10-24 ENCOUNTER — Telehealth: Payer: Self-pay

## 2020-10-24 NOTE — Telephone Encounter (Signed)
Lvm to change 4/19 appt to virtual

## 2020-10-25 ENCOUNTER — Ambulatory Visit (INDEPENDENT_AMBULATORY_CARE_PROVIDER_SITE_OTHER): Payer: 59 | Admitting: Nurse Practitioner

## 2020-10-25 ENCOUNTER — Encounter: Payer: Self-pay | Admitting: Nurse Practitioner

## 2020-10-25 DIAGNOSIS — K5909 Other constipation: Secondary | ICD-10-CM | POA: Diagnosis not present

## 2020-10-25 DIAGNOSIS — N3000 Acute cystitis without hematuria: Secondary | ICD-10-CM | POA: Diagnosis not present

## 2020-10-25 MED ORDER — LINACLOTIDE 72 MCG PO CAPS: 72.0000 ug | ORAL_CAPSULE | Freq: Every day | ORAL | 4 refills | Status: DC

## 2020-10-25 NOTE — Assessment & Plan Note (Addendum)
Ongoing since childhood.  Poor response with OTC medications and can not take Miralax due to taste.  At this time will trial Linzess 72 MCG daily, discussed with patient this and Lubiprostone -- unsure either will be covered by insurance but will send in to see, patient would like to try.  Would benefit from this due to long term constipation issues and bloating -- more chronic in nature, no cramping, low suspicion for irritable bowel.  Would benefit from colonoscopy, refuses this at this time.  Return in May as scheduled.

## 2020-10-25 NOTE — Assessment & Plan Note (Signed)
Acute and improved symptoms at this time.  Recommend continue to increase water intake and monitor for return of symptoms, if present then return to office.

## 2020-10-25 NOTE — Patient Instructions (Signed)
Constipation, Adult Constipation is when a person has trouble pooping (having a bowel movement). When you have this condition, you may poop fewer than 3 times a week. Your poop (stool) may also be dry, hard, or bigger than normal. Follow these instructions at home: Eating and drinking  Eat foods that have a lot of fiber, such as: ? Fresh fruits and vegetables. ? Whole grains. ? Beans.  Eat less of foods that are low in fiber and high in fat and sugar, such as: ? French fries. ? Hamburgers. ? Cookies. ? Candy. ? Soda.  Drink enough fluid to keep your pee (urine) pale yellow.   General instructions  Exercise regularly or as told by your doctor. Try to do 150 minutes of exercise each week.  Go to the restroom when you feel like you need to poop. Do not hold it in.  Take over-the-counter and prescription medicines only as told by your doctor. These include any fiber supplements.  When you poop: ? Do deep breathing while relaxing your lower belly (abdomen). ? Relax your pelvic floor. The pelvic floor is a group of muscles that support the rectum, bladder, and intestines (as well as the uterus in women).  Watch your condition for any changes. Tell your doctor if you notice any.  Keep all follow-up visits as told by your doctor. This is important. Contact a doctor if:  You have pain that gets worse.  You have a fever.  You have not pooped for 4 days.  You vomit.  You are not hungry.  You lose weight.  You are bleeding from the opening of the butt (anus).  You have thin, pencil-like poop. Get help right away if:  You have a fever, and your symptoms suddenly get worse.  You leak poop or have blood in your poop.  Your belly feels hard or bigger than normal (bloated).  You have very bad belly pain.  You feel dizzy or you faint. Summary  Constipation is when a person poops fewer than 3 times a week, has trouble pooping, or has poop that is dry, hard, or bigger than  normal.  Eat foods that have a lot of fiber.  Drink enough fluid to keep your pee (urine) pale yellow.  Take over-the-counter and prescription medicines only as told by your doctor. These include any fiber supplements. This information is not intended to replace advice given to you by your health care provider. Make sure you discuss any questions you have with your health care provider. Document Revised: 05/13/2019 Document Reviewed: 05/13/2019 Elsevier Patient Education  2021 Elsevier Inc.  

## 2020-10-25 NOTE — Progress Notes (Signed)
LMP 09/07/2010 (Approximate)    Subjective:    Patient ID: Rosilyn Mings, female    DOB: [birth year], 60 y.o.   MRN: [id number]  HPI: ZAKARI COUCHMAN is a 60 y.o. female  Chief Complaint  Patient presents with  . Urinary Tract Infection    Patient states she is here to follow up for a UTI. Patient states she is doing better.     . This visit was completed via telephone due to the restrictions of the COVID-19 pandemic. All issues as above were discussed and addressed but no physical exam was performed. If it was felt that the patient should be evaluated in the office, they were directed there. The patient verbally consented to this visit. Patient was unable to complete an audio/visual visit due to Lack of equipment. Due to the catastrophic nature of the COVID-19 pandemic, this visit was done through audio contact only. . Location of the patient: home . Location of the provider: home . Those involved with this call:  . Provider: Marnee Guarneri, DNP . CMA: Irena Reichmann, CMA . Front Desk/Registration: Jill Side  . Time spent on call: 21 minutes on the phone discussing health concerns. 15 minutes total spent in review of patient's record and preparation of their chart.  . I verified patient identity using two factors (patient name and date of birth). Patient consents verbally to being seen via telemedicine visit today.     UTI Patient was seen on 10/04/20 and treated for Klebsiella pneumoniae UTI with Macrobid, which it was susceptible too.  No blood was noted in urine and she initially presented with abdominal pain, reports pain is improved -- but still feels full at times/bloated -- notices this more after eating.  Back pain stopped after 24 hours.  Has probiotic at home, but has not started taking.  Has underlying constipation -- has had this issue since childhood.   Duration:days Treatments attempted: abx Fever: no Nausea: none Vomiting: no Weight loss: no Decreased  appetite: none Diarrhea: none Constipation: yes -- out of stool softener since Sunday -- taking Colace, can not take Miralax, did not like taste Blood in stool: no Heartburn: no Jaundice: no Rash: no Dysuria/urinary frequency: no Hematuria: no History of sexually transmitted disease:  Recurrent NSAID use: none  Relevant past medical, surgical, family and social history reviewed and updated as indicated. Interim medical history since our last visit reviewed. Allergies and medications reviewed and updated.  Review of Systems  Constitutional: Negative for activity change, appetite change, diaphoresis, fatigue and fever.  Respiratory: Negative.   Cardiovascular: Negative.   Gastrointestinal: Positive for abdominal distention (bloating after meals at times), constipation and nausea. Negative for abdominal pain, blood in stool, diarrhea and vomiting.  Neurological: Negative.   Psychiatric/Behavioral: Negative.     Per HPI unless specifically indicated above     Objective:    LMP 09/07/2010 (Approximate)   Wt Readings from Last 3 Encounters:  10/04/20 177 lb 6.4 oz (80.5 kg)  04/26/20 184 lb 9.6 oz (83.7 kg)  03/03/20 174 lb (78.9 kg)    Physical Exam   Unable to perform due to telephone visit only.  Results for orders placed or performed in visit on 10/04/20  WET PREP FOR [place], YEAST, CLUE   Specimen: Sterile Swab   Sterile Swab  Result Value Ref Range   Trichomonas Exam Negative Negative   Yeast Exam Negative Negative   Clue Cell Exam Negative Negative  Urine Culture   Specimen: Urine  UR  Result Value Ref Range   Urine Culture, Routine Final report (A)    Organism ID, Bacteria Klebsiella pneumoniae (A)    Antimicrobial Susceptibility Comment   Microscopic Examination   Urine  Result Value Ref Range   WBC, UA None seen 0 - 5 /hpf   RBC None seen 0 - 2 /hpf   Epithelial Cells (non renal) 0-10 0 - 10 /hpf   Bacteria, UA Many (A) None seen/Few  Urinalysis,  Routine w reflex microscopic  Result Value Ref Range   Specific Gravity, UA 1.020 1.005 - 1.030   pH, UA 5.5 5.0 - 7.5   Color, UA Yellow Yellow   Appearance Ur Cloudy (A) Clear   Leukocytes,UA Negative Negative   Protein,UA Negative Negative/Trace   Glucose, UA Negative Negative   Ketones, UA Negative Negative   RBC, UA Negative Negative   Bilirubin, UA Negative Negative   Urobilinogen, Ur 0.2 0.2 - 1.0 mg/dL   Nitrite, UA Positive (A) Negative   Microscopic Examination See below:   CBC with Differential/Platelet  Result Value Ref Range   WBC 4.9 3.4 - 10.8 x10E3/uL   RBC 4.28 3.77 - 5.28 x10E6/uL   Hemoglobin 13.7 11.1 - 15.9 g/dL   Hematocrit 40.2 34.0 - 46.6 %   MCV 94 79 - 97 fL   MCH 32.0 26.6 - 33.0 pg   MCHC 34.1 31.5 - 35.7 g/dL   RDW 12.1 11.7 - 15.4 %   Platelets 214 150 - 450 x10E3/uL   Neutrophils 53 Not Estab. %   Lymphs 38 Not Estab. %   Monocytes 6 Not Estab. %   Eos 2 Not Estab. %   Basos 1 Not Estab. %   Neutrophils Absolute 2.6 1.4 - 7.0 x10E3/uL   Lymphocytes Absolute 1.9 0.7 - 3.1 x10E3/uL   Monocytes Absolute 0.3 0.1 - 0.9 x10E3/uL   EOS (ABSOLUTE) 0.1 0.0 - 0.4 x10E3/uL   Basophils Absolute 0.0 0.0 - 0.2 x10E3/uL   Immature Granulocytes 0 Not Estab. %   Immature Grans (Abs) 0.0 0.0 - 0.1 x10E3/uL  Comprehensive metabolic panel  Result Value Ref Range   Glucose 72 65 - 99 mg/dL   BUN 15 8 - 27 mg/dL   Creatinine, Ser 1.05 (H) 0.57 - 1.00 mg/dL   eGFR 60 >59 mL/min/1.73   BUN/Creatinine Ratio 14 12 - 28   Sodium 142 134 - 144 mmol/L   Potassium 4.2 3.5 - 5.2 mmol/L   Chloride 103 96 - 106 mmol/L   CO2 21 20 - 29 mmol/L   Calcium 9.3 8.7 - 10.3 mg/dL   Total Protein 6.4 6.0 - 8.5 g/dL   Albumin 4.5 3.8 - 4.8 g/dL   Globulin, Total 1.9 1.5 - 4.5 g/dL   Albumin/Globulin Ratio 2.4 (H) 1.2 - 2.2   Bilirubin Total 0.7 0.0 - 1.2 mg/dL   Alkaline Phosphatase 76 44 - 121 IU/L   AST 18 0 - 40 IU/L   ALT 11 0 - 32 IU/L      Assessment & Plan:    Problem List Items Addressed This Visit      Digestive   Chronic constipation    Ongoing since childhood.  Poor response with OTC medications and can not take Miralax due to taste.  At this time will trial Linzess 72 MCG daily, discussed with patient this and Lubiprostone -- unsure either will be covered by insurance but will send in to see, patient would like to try.  Would benefit  from this due to long term constipation issues and bloating -- more chronic in nature, no cramping, low suspicion for irritable bowel.  Would benefit from colonoscopy, refuses this at this time.  Return in May as scheduled.        Genitourinary   Acute cystitis without hematuria - Primary    Acute and improved symptoms at this time.  Recommend continue to increase water intake and monitor for return of symptoms, if present then return to office.         I discussed the assessment and treatment plan with the patient. The patient was provided an opportunity to ask questions and all were answered. The patient agreed with the plan and demonstrated an understanding of the instructions.   The patient was advised to call back or seek an in-person evaluation if the symptoms worsen or if the condition fails to improve as anticipated.   I provided 21+ minutes of time during this encounter.  Follow up plan: Return for as scheduled for physical in May.

## 2020-10-31 ENCOUNTER — Ambulatory Visit: Payer: 59 | Admitting: Dermatology

## 2020-10-31 ENCOUNTER — Other Ambulatory Visit: Payer: Self-pay

## 2020-11-23 ENCOUNTER — Encounter: Payer: 59 | Admitting: Nurse Practitioner

## 2020-12-14 ENCOUNTER — Telehealth: Payer: Self-pay

## 2020-12-14 NOTE — Telephone Encounter (Signed)
Patient scheduled for annual lung cancer screening ct scan on 12/23/20 @ 3:00. Patient remembers location, insurance is the same and she still currently smokes 1ppd.please send text reminder

## 2020-12-15 ENCOUNTER — Other Ambulatory Visit: Payer: Self-pay | Admitting: *Deleted

## 2020-12-15 DIAGNOSIS — F17200 Nicotine dependence, unspecified, uncomplicated: Secondary | ICD-10-CM

## 2020-12-15 DIAGNOSIS — Z87891 Personal history of nicotine dependence: Secondary | ICD-10-CM

## 2020-12-15 DIAGNOSIS — Z122 Encounter for screening for malignant neoplasm of respiratory organs: Secondary | ICD-10-CM

## 2020-12-15 NOTE — Progress Notes (Signed)
Contacted and scheduled for annual lung screening CT scan. Patient is a current smoker with a 66 pack year history.

## 2020-12-23 ENCOUNTER — Other Ambulatory Visit: Payer: Self-pay

## 2020-12-23 ENCOUNTER — Ambulatory Visit
Admission: RE | Admit: 2020-12-23 | Discharge: 2020-12-23 | Disposition: A | Discharge status: Home / Self Care | Payer: 59 | Source: Ambulatory Visit | Attending: Oncology | Admitting: Oncology

## 2020-12-23 DIAGNOSIS — F17200 Nicotine dependence, unspecified, uncomplicated: Secondary | ICD-10-CM | POA: Diagnosis present

## 2020-12-23 DIAGNOSIS — Z87891 Personal history of nicotine dependence: Secondary | ICD-10-CM | POA: Insufficient documentation

## 2020-12-23 DIAGNOSIS — Z122 Encounter for screening for malignant neoplasm of respiratory organs: Secondary | ICD-10-CM | POA: Insufficient documentation

## 2021-01-18 ENCOUNTER — Other Ambulatory Visit: Payer: Self-pay | Admitting: Nurse Practitioner

## 2021-01-18 ENCOUNTER — Encounter: Payer: Self-pay | Admitting: *Deleted

## 2021-01-20 ENCOUNTER — Encounter: Payer: Self-pay | Admitting: Nurse Practitioner

## 2021-01-20 ENCOUNTER — Ambulatory Visit (INDEPENDENT_AMBULATORY_CARE_PROVIDER_SITE_OTHER): Payer: 59 | Admitting: Nurse Practitioner

## 2021-01-20 ENCOUNTER — Other Ambulatory Visit: Payer: Self-pay

## 2021-01-20 VITALS — BP 106/71 | HR 75 | Temp 98.6°F | Ht 63.0 in | Wt 169.0 lb

## 2021-01-20 DIAGNOSIS — Z1231 Encounter for screening mammogram for malignant neoplasm of breast: Secondary | ICD-10-CM

## 2021-01-20 DIAGNOSIS — J432 Centrilobular emphysema: Secondary | ICD-10-CM | POA: Diagnosis not present

## 2021-01-20 DIAGNOSIS — F17210 Nicotine dependence, cigarettes, uncomplicated: Secondary | ICD-10-CM

## 2021-01-20 DIAGNOSIS — E782 Mixed hyperlipidemia: Secondary | ICD-10-CM

## 2021-01-20 DIAGNOSIS — R0989 Other specified symptoms and signs involving the circulatory and respiratory systems: Secondary | ICD-10-CM

## 2021-01-20 DIAGNOSIS — Z0000 Encounter for general adult medical examination without abnormal findings: Secondary | ICD-10-CM | POA: Diagnosis not present

## 2021-01-20 DIAGNOSIS — K8020 Calculus of gallbladder without cholecystitis without obstruction: Secondary | ICD-10-CM

## 2021-01-20 DIAGNOSIS — I700 Atherosclerosis of aorta: Secondary | ICD-10-CM | POA: Diagnosis not present

## 2021-01-20 DIAGNOSIS — Z8639 Personal history of other endocrine, nutritional and metabolic disease: Secondary | ICD-10-CM

## 2021-01-20 DIAGNOSIS — Z1211 Encounter for screening for malignant neoplasm of colon: Secondary | ICD-10-CM | POA: Diagnosis not present

## 2021-01-20 DIAGNOSIS — E559 Vitamin D deficiency, unspecified: Secondary | ICD-10-CM

## 2021-01-20 DIAGNOSIS — Z23 Encounter for immunization: Secondary | ICD-10-CM

## 2021-01-20 MED ORDER — SHINGRIX 50 MCG/0.5ML IM SUSR: 0.5000 mL | Freq: Once | INTRAMUSCULAR | 0 refills | Status: AC

## 2021-01-20 MED ORDER — LINACLOTIDE 72 MCG PO CAPS: 72.0000 ug | ORAL_CAPSULE | Freq: Every day | ORAL | 4 refills | Status: DC

## 2021-01-20 MED ORDER — ATORVASTATIN CALCIUM 10 MG PO TABS: 10.0000 mg | ORAL_TABLET | Freq: Every day | ORAL | 4 refills | Status: DC

## 2021-01-20 MED ORDER — ANORO ELLIPTA 62.5-25 MCG/INH IN AEPB: INHALATION_SPRAY | RESPIRATORY_TRACT | 4 refills | Status: DC

## 2021-01-20 NOTE — Assessment & Plan Note (Signed)
<  50% stenosis on imaging.  Follow-up with vascular PRN or is symptomatic, plan to recheck imaging in 5-6 years.  Recommend complete cessation of smoking. 

## 2021-01-20 NOTE — Assessment & Plan Note (Signed)
I have recommended complete cessation of tobacco use. I have discussed various options available for assistance with tobacco cessation including over the counter methods (Nicotine gum, patch and lozenges). We also discussed prescription options (Chantix, Nicotine Inhaler / Nasal Spray). The patient is not interested in pursuing any prescription tobacco cessation options at this time.  

## 2021-01-20 NOTE — Assessment & Plan Note (Addendum)
Noted on lung CT screening.  Continue statin and ASA daily for prevention purposes.  Recommend complete cessation of smoking. 

## 2021-01-20 NOTE — Assessment & Plan Note (Signed)
Noted on CT lung screening, no current symptoms, continue to monitor.

## 2021-01-20 NOTE — Progress Notes (Signed)
BP 106/71   Pulse 75   Temp 98.6 F (37 C) (Oral)   Ht 5' 3"  (1.6 m)   Wt 169 lb (76.7 kg)   LMP 09/07/2010 (Approximate)   SpO2 97%   BMI 29.94 kg/m    Subjective:    Patient ID: Aimee Lawson, female    DOB: 26-Jul-1960, 61 y.o.   MRN: [id number]  HPI: Aimee Lawson is a 60 y.o. female presenting on 01/20/2021 for comprehensive medical examination. Current medical complaints include:none  She currently lives with: self Menopausal Symptoms: no   CAROTID BRUITS: Saw vascular on 10/22/2019 -- had imaging which showed <50% stenosis bilaterally, unchanged from previous studies.  Asymptomatic.  To recheck in 5-6 years.    COPD Noted centrilobular and paraseptal emphysema + aortic atherosclerosis on lung screening.  Using Anoro daily and Albuterol as needed. She does smoke -- averages about 1 PPD, has smoked since she was 15.  She is scheduled to quit on September 1st.    COPD status: stable Satisfied with current treatment?: no Oxygen use: no Dyspnea frequency: none Cough frequency: occasional Rescue inhaler frequency:  none Limitation of activity: no Productive cough: none Last Spirometry: 01/01/20 FEV1 66% and FEV1/FVC 83% Pneumovax: Not up to Date -- discuss further next visit Influenza: Up to Date    HYPERLIPIDEMIA Continues Atorvastatin daily. Hyperlipidemia status: good compliance Satisfied with current treatment?  yes Side effects:  no Medication compliance: good compliance Supplements: none Aspirin:  yes The 10-year ASCVD risk score Mikey Bussing DC Jr., et al., 2013) is: 3.9%   Values used to calculate the score:     Age: 73 years     Sex: Female     Is Non-Hispanic African American: No     Diabetic: No     Tobacco smoker: Yes     Systolic Blood Pressure: 527 mmHg     Is BP treated: No     HDL Cholesterol: 69 mg/dL     Total Cholesterol: 143 mg/dL Chest pain:  no Coronary artery disease:  no Family history CAD: yes Family history early CAD:   yes  Depression Screen done today and results listed below:  Depression screen [facility] 2/9 10/04/2020 09/29/2019  Decreased Interest 0 0  Down, Depressed, Hopeless 0 0  PHQ - 2 Score 0 0  Altered sleeping 0 -  Tired, decreased energy 1 -  Change in appetite 0 -  Feeling bad or failure about yourself  0 -  Trouble concentrating 0 -  Moving slowly or fidgety/restless 0 -  Suicidal thoughts 0 -  PHQ-9 Score 1 -    The patient does not have a history of falls. I did not complete a risk assessment for falls. A plan of care for falls was not documented.   Past Medical History:  Past Medical History:  Diagnosis Date   Atypical mole 11/04/2013   Right superior lateral pubic   No pertinent past medical history    Squamous cell carcinoma of skin 10/31/2017   right pretibial     Surgical History:  Past Surgical History:  Procedure Laterality Date   BREAST ENHANCEMENT SURGERY     FACIAL COSMETIC SURGERY      Medications:  Current Outpatient Medications on File Prior to Visit  Medication Sig   albuterol (VENTOLIN HFA) 108 (90 Base) MCG/ACT inhaler Inhale 2 puffs into the lungs every 6 (six) hours as needed for wheezing or shortness of breath.   No current facility-administered medications on file  prior to visit.    Allergies:  Allergies  Allergen Reactions   Penicillins Anaphylaxis    Social History:  Social History   Socioeconomic History   Marital status: Married    Spouse name: Not on file   Number of children: Not on file   Years of education: Not on file   Highest education level: Not on file  Occupational History   Not on file  Tobacco Use   Smoking status: Former    Packs/day: 1.50    Years: 44.00    Pack years: 66.00    Types: Cigarettes    Quit date: 03/13/2020    Years since quitting: 0.8   Smokeless tobacco: Never  Vaping Use   Vaping Use: Never used  Substance and Sexual Activity   Alcohol use: Yes    Comment: socially   Drug use: Never   Sexual  activity: Not Currently  Other Topics Concern   Not on file  Social History Narrative   Not on file   Social Determinants of Health   Financial Resource Strain: Not on file  Food Insecurity: Not on file  Transportation Needs: Not on file  Physical Activity: Not on file  Stress: Not on file  Social Connections: Not on file  Intimate Partner Violence: Not on file   Social History   Tobacco Use  Smoking Status Former   Packs/day: 1.50   Years: 44.00   Pack years: 66.00   Types: Cigarettes   Quit date: 03/13/2020   Years since quitting: 0.8  Smokeless Tobacco Never   Social History   Substance and Sexual Activity  Alcohol Use Yes   Comment: socially    Family History:  Family History  Problem Relation Age of Onset   Heart attack Mother    Stroke Mother    Uterine cancer Mother    Colon cancer Mother    Heart attack Father    Hypertension Brother    Other Brother        Pacemaker   Emphysema Brother    Diabetes Maternal Grandmother    Heart disease Maternal Grandmother    Heart disease Maternal Grandfather    Heart disease Paternal Grandmother    Heart disease Paternal Grandfather     Past medical history, surgical history, medications, allergies, family history and social history reviewed with patient today and changes made to appropriate areas of the chart.   Review of Systems - negative All other ROS negative except what is listed above and in the HPI.      Objective:    BP 106/71   Pulse 75   Temp 98.6 F (37 C) (Oral)   Ht 5' 3"  (1.6 m)   Wt 169 lb (76.7 kg)   LMP 09/07/2010 (Approximate)   SpO2 97%   BMI 29.94 kg/m   Wt Readings from Last 3 Encounters:  01/20/21 169 lb (76.7 kg)  12/23/20 167 lb (75.8 kg)  10/04/20 177 lb 6.4 oz (80.5 kg)    Physical Exam Constitutional:      General: She is awake. She is not in acute distress.    Appearance: She is well-developed. She is not ill-appearing.  HENT:     Head: Normocephalic and atraumatic.      Right Ear: Hearing, tympanic membrane, ear canal and external ear normal. No drainage.     Left Ear: Hearing, tympanic membrane, ear canal and external ear normal. No drainage.     Nose: Nose normal.  Right Sinus: No maxillary sinus tenderness or frontal sinus tenderness.     Left Sinus: No maxillary sinus tenderness or frontal sinus tenderness.     Mouth/Throat:     Mouth: Mucous membranes are moist.     Pharynx: Oropharynx is clear. Uvula midline. No pharyngeal swelling, oropharyngeal exudate or posterior oropharyngeal erythema.  Eyes:     General: Lids are normal.        Right eye: No discharge.        Left eye: No discharge.     Extraocular Movements: Extraocular movements intact.     Conjunctiva/sclera: Conjunctivae normal.     Pupils: Pupils are equal, round, and reactive to light.     Visual Fields: Right eye visual fields normal and left eye visual fields normal.  Neck:     Thyroid: No thyromegaly.     Vascular: Carotid bruit (subtle R>L) present.     Trachea: Trachea normal.  Cardiovascular:     Rate and Rhythm: Normal rate and regular rhythm.     Heart sounds: Normal heart sounds. No murmur heard.   No gallop.  Pulmonary:     Effort: Pulmonary effort is normal. No accessory muscle usage or respiratory distress.     Breath sounds: Normal breath sounds.  Chest:  Breasts:    Right: Normal. No axillary adenopathy or supraclavicular adenopathy.     Left: Normal. No axillary adenopathy or supraclavicular adenopathy.  Abdominal:     General: Bowel sounds are normal.     Palpations: Abdomen is soft. There is no hepatomegaly or splenomegaly.     Tenderness: There is no abdominal tenderness.  Musculoskeletal:        General: Normal range of motion.     Cervical back: Normal range of motion and neck supple.     Right lower leg: No edema.     Left lower leg: No edema.  Lymphadenopathy:     Head:     Right side of head: No submental, submandibular, tonsillar,  preauricular or posterior auricular adenopathy.     Left side of head: No submental, submandibular, tonsillar, preauricular or posterior auricular adenopathy.     Cervical: No cervical adenopathy.     Upper Body:     Right upper body: No supraclavicular, axillary or pectoral adenopathy.     Left upper body: No supraclavicular, axillary or pectoral adenopathy.  Skin:    General: Skin is warm and dry.     Capillary Refill: Capillary refill takes less than 2 seconds.     Findings: No rash.  Neurological:     Mental Status: She is alert and oriented to person, place, and time.     Cranial Nerves: Cranial nerves are intact.     Gait: Gait is intact.     Deep Tendon Reflexes: Reflexes are normal and symmetric.     Reflex Scores:      Brachioradialis reflexes are 2+ on the right side and 2+ on the left side.      Patellar reflexes are 2+ on the right side and 2+ on the left side. Psychiatric:        Attention and Perception: Attention normal.        Mood and Affect: Mood normal.        Speech: Speech normal.        Behavior: Behavior normal. Behavior is cooperative.        Thought Content: Thought content normal.        Judgment: Judgment normal.  Results for orders placed or performed in visit on 10/04/20  WET PREP FOR [place], YEAST, CLUE   Specimen: Sterile Swab   Sterile Swab  Result Value Ref Range   Trichomonas Exam Negative Negative   Yeast Exam Negative Negative   Clue Cell Exam Negative Negative  Urine Culture   Specimen: Urine   UR  Result Value Ref Range   Urine Culture, Routine Final report (A)    Organism ID, Bacteria Klebsiella pneumoniae (A)    Antimicrobial Susceptibility Comment   Microscopic Examination   Urine  Result Value Ref Range   WBC, UA None seen 0 - 5 /hpf   RBC None seen 0 - 2 /hpf   Epithelial Cells (non renal) 0-10 0 - 10 /hpf   Bacteria, UA Many (A) None seen/Few  Urinalysis, Routine w reflex microscopic  Result Value Ref Range   Specific  Gravity, UA 1.020 1.005 - 1.030   pH, UA 5.5 5.0 - 7.5   Color, UA Yellow Yellow   Appearance Ur Cloudy (A) Clear   Leukocytes,UA Negative Negative   Protein,UA Negative Negative/Trace   Glucose, UA Negative Negative   Ketones, UA Negative Negative   RBC, UA Negative Negative   Bilirubin, UA Negative Negative   Urobilinogen, Ur 0.2 0.2 - 1.0 mg/dL   Nitrite, UA Positive (A) Negative   Microscopic Examination See below:   CBC with Differential/Platelet  Result Value Ref Range   WBC 4.9 3.4 - 10.8 x10E3/uL   RBC 4.28 3.77 - 5.28 x10E6/uL   Hemoglobin 13.7 11.1 - 15.9 g/dL   Hematocrit 40.2 34.0 - 46.6 %   MCV 94 79 - 97 fL   MCH 32.0 26.6 - 33.0 pg   MCHC 34.1 31.5 - 35.7 g/dL   RDW 12.1 11.7 - 15.4 %   Platelets 214 150 - 450 x10E3/uL   Neutrophils 53 Not Estab. %   Lymphs 38 Not Estab. %   Monocytes 6 Not Estab. %   Eos 2 Not Estab. %   Basos 1 Not Estab. %   Neutrophils Absolute 2.6 1.4 - 7.0 x10E3/uL   Lymphocytes Absolute 1.9 0.7 - 3.1 x10E3/uL   Monocytes Absolute 0.3 0.1 - 0.9 x10E3/uL   EOS (ABSOLUTE) 0.1 0.0 - 0.4 x10E3/uL   Basophils Absolute 0.0 0.0 - 0.2 x10E3/uL   Immature Granulocytes 0 Not Estab. %   Immature Grans (Abs) 0.0 0.0 - 0.1 x10E3/uL  Comprehensive metabolic panel  Result Value Ref Range   Glucose 72 65 - 99 mg/dL   BUN 15 8 - 27 mg/dL   Creatinine, Ser 1.05 (H) 0.57 - 1.00 mg/dL   eGFR 60 >59 mL/min/1.73   BUN/Creatinine Ratio 14 12 - 28   Sodium 142 134 - 144 mmol/L   Potassium 4.2 3.5 - 5.2 mmol/L   Chloride 103 96 - 106 mmol/L   CO2 21 20 - 29 mmol/L   Calcium 9.3 8.7 - 10.3 mg/dL   Total Protein 6.4 6.0 - 8.5 g/dL   Albumin 4.5 3.8 - 4.8 g/dL   Globulin, Total 1.9 1.5 - 4.5 g/dL   Albumin/Globulin Ratio 2.4 (H) 1.2 - 2.2   Bilirubin Total 0.7 0.0 - 1.2 mg/dL   Alkaline Phosphatase 76 44 - 121 IU/L   AST 18 0 - 40 IU/L   ALT 11 0 - 32 IU/L      Assessment & Plan:   Problem List Items Addressed This Visit       Cardiovascular  and Mediastinum  Aortic atherosclerosis ([place])    Noted on lung CT screening.  Continue statin and ASA daily for prevention purposes.  Recommend complete cessation of smoking.       Relevant Medications   atorvastatin (LIPITOR) 10 MG tablet     Respiratory   Centrilobular emphysema ([place]) - Primary    Ongoing.  Noted on lung CT screening.  Last visit FEV1 66% and FEV1/FVC 83% in June 2021.  Continue Anoro at this time, may adjust due to cost -- to consistently use daily and then use Albuterol ONLY as needed for SOB or wheezing.  Educated her on emphysema and progressive nature, ways to slow down progression, including smoking cessation.  She has set quit date for smoking in upcoming months.  Return in 6 months.  Labs today.       Relevant Medications   umeclidinium-vilanterol (ANORO ELLIPTA) 62.5-25 MCG/INH AEPB   Other Relevant Orders   CBC with Differential/Platelet   Comprehensive metabolic panel   TSH     Digestive   Cholelithiasis    Noted on CT lung screening, no current symptoms, continue to monitor.         Other   Cigarette nicotine dependence without complication    I have recommended complete cessation of tobacco use. I have discussed various options available for assistance with tobacco cessation including over the counter methods (Nicotine gum, patch and lozenges). We also discussed prescription options (Chantix, Nicotine Inhaler / Nasal Spray). The patient is not interested in pursuing any prescription tobacco cessation options at this time.        Bilateral carotid bruits    <50% stenosis on imaging.  Follow-up with vascular PRN or is symptomatic, plan to recheck imaging in 5-6 years.  Recommend complete cessation of smoking.       Hyperlipemia    New diagnosis and tolerating statin started recently.  Continue Atorvastatin 10 MG daily.  Check lipid panel and CMP today.  Recommend diet changes and modest loss.        Relevant Medications   atorvastatin  (LIPITOR) 10 MG tablet   Other Relevant Orders   Lipid Panel w/o Chol/HDL Ratio   Other Visit Diagnoses     Encounter for screening mammogram for malignant neoplasm of breast       Mammogram ordered   Relevant Orders   MM 3D SCREEN BREAST BILATERAL   Colon cancer screening       Referral to GI placed   Relevant Orders   Ambulatory referral to Gastroenterology   Need for Td vaccine       Td provided today.    Relevant Orders   Td vaccine greater than or equal to 7yo preservative free IM   Vitamin D deficiency       History of low levels, recheck today and start supplement as needed.   Relevant Orders   VITAMIN D 25 Hydroxy (Vit-D Deficiency, Fractures)   History of non anemic vitamin B12 deficiency       History of low B12 with injections, check today and start supplement as needed.   Relevant Orders   Vitamin B12   Annual physical exam       Annual physical today with labs.  Health maintenance reviewed.        Follow up plan: Return in about 6 months (around 07/23/2021) for HLD and COPD.   LABORATORY TESTING:  - Pap smear: up to date  IMMUNIZATIONS:   - Tdap: Tetanus vaccination status reviewed: provided today -  Influenza: Up to date - Pneumovax: thinks she has had -- will get records - Prevnar: Not applicable - HPV: Not applicable - Zostavax vaccine: sent to pharmacy  SCREENING: -Mammogram: Ordered today  - Colonoscopy: Ordered today  - Bone Density: Not applicable  -Hearing Test: Not applicable  -Spirometry: Not applicable   PATIENT COUNSELING:   Advised to take 1 mg of folate supplement per day if capable of pregnancy.   Sexuality: Discussed sexually transmitted diseases, partner selection, use of condoms, avoidance of unintended pregnancy  and contraceptive alternatives.   Advised to avoid cigarette smoking.  I discussed with the patient that most people either abstain from alcohol or drink within safe limits (<=14/week and <=4 drinks/occasion for  males, <=7/weeks and <= 3 drinks/occasion for females) and that the risk for alcohol disorders and other health effects rises proportionally with the number of drinks per week and how often a drinker exceeds daily limits.  Discussed cessation/primary prevention of drug use and availability of treatment for abuse.   Diet: Encouraged to adjust caloric intake to maintain  or achieve ideal body weight, to reduce intake of dietary saturated fat and total fat, to limit sodium intake by avoiding high sodium foods and not adding table salt, and to maintain adequate dietary potassium and calcium preferably from fresh fruits, vegetables, and low-fat dairy products.    Stressed the importance of regular exercise  Injury prevention: Discussed safety belts, safety helmets, smoke detector, smoking near bedding or upholstery.   Dental health: Discussed importance of regular tooth brushing, flossing, and dental visits.    NEXT PREVENTATIVE PHYSICAL DUE IN 1 YEAR. Return in about 6 months (around 07/23/2021) for HLD and COPD.

## 2021-01-20 NOTE — Assessment & Plan Note (Signed)
New diagnosis and tolerating statin started recently.  Continue Atorvastatin 10 MG daily.  Check lipid panel and CMP today.  Recommend diet changes and modest loss.

## 2021-01-20 NOTE — Patient Instructions (Addendum)
[facility]  Address: [address], [place], [place] [address]  Phone: [phone]  Mammogram A mammogram is an X-ray of the breasts. This is done to check for changes that are not normal. This test can look for changes that may be caused by breast cancer or other problems. Mammograms are regularly done on women beginning at age 62. A man may have a mammogram if he has a lump or swelling in his breast. Tell a doctor: About any allergies you have. If you have breast implants. If you have had breast disease, biopsy, or surgery. If you have a family history of breast cancer. If you are breastfeeding. Whether you are pregnant or may be pregnant. What are the risks? Generally, this is a safe procedure. But problems may occur, including: Being exposed to radiation. Radiation levels are very low with this test. The need for more tests. The results were not read properly. Trouble finding breast cancer in women with dense breasts. What happens before the test? Have this test done about 1-2 weeks after your menstrual period. This is often when your breasts are the least tender. If you are visiting a new doctor or clinic, have any past mammogram images sent to your new doctor's office. Wash your breasts and under your arms on the day of the test. Do not use deodorants, perfumes, lotions, or powders on the day of the test. Take off any jewelry from your neck. Wear clothes that you can change into and out of easily. What happens during the test?  You will take off your clothes from the waist up. You will put on a gown. You will stand in front of the X-ray machine. Each breast will be placed between two plastic or glass plates. The plates will press down on your breast for a few seconds. Try to relax. This does not cause any harm to your breasts. It may not feel comfortable, but it will be very brief. X-rays will be taken from different angles of each  breast. The procedure may vary among doctors and hospitals. What can I expect after the test? The mammogram will be read by a specialist (radiologist). You may need to do parts of the test again. This depends on the quality of the images. You may go back to your normal activities. It is up to you to get the results of your test. Ask how to get your results when they are ready. Summary A mammogram is an X-ray of the breasts. It looks for changes that may be caused by breast cancer or other problems. A man may have this test if he has a lump or swelling in his breast. Before the test, tell your doctor about any breast problems that you have had in the past. Have this test done about 1-2 weeks after your menstrual period. Ask when your test results will be ready. Make sure you get your test results. This information is not intended to replace advice given to you by your health care provider. Make sure you discuss any questions you have with your health care provider. Document Revised: 04/25/2020 Document Reviewed: 04/25/2020 Elsevier Patient Education  2022 Elsevier Inc.   

## 2021-01-20 NOTE — Assessment & Plan Note (Signed)
Ongoing.  Noted on lung CT screening.  Last visit FEV1 66% and FEV1/FVC 83% in June 2021.  Continue Anoro at this time, may adjust due to cost -- to consistently use daily and then use Albuterol ONLY as needed for SOB or wheezing.  Educated her on emphysema and progressive nature, ways to slow down progression, including smoking cessation.  She has set quit date for smoking in upcoming months.  Return in 6 months.  Labs today.

## 2021-01-21 ENCOUNTER — Encounter: Payer: Self-pay | Admitting: Nurse Practitioner

## 2021-01-21 DIAGNOSIS — E538 Deficiency of other specified B group vitamins: Secondary | ICD-10-CM | POA: Insufficient documentation

## 2021-01-21 LAB — CBC WITH DIFFERENTIAL/PLATELET
Basophils Absolute: 0 10*3/uL (ref 0.0–0.2)
Basos: 0 %
EOS (ABSOLUTE): 0.1 10*3/uL (ref 0.0–0.4)
Eos: 1 %
Hematocrit: 40.6 % (ref 34.0–46.6)
Hemoglobin: 13.9 g/dL (ref 11.1–15.9)
Immature Grans (Abs): 0 10*3/uL (ref 0.0–0.1)
Immature Granulocytes: 0 %
Lymphocytes Absolute: 2.9 10*3/uL (ref 0.7–3.1)
Lymphs: 41 %
MCH: 31.4 pg (ref 26.6–33.0)
MCHC: 34.2 g/dL (ref 31.5–35.7)
MCV: 92 fL (ref 79–97)
Monocytes Absolute: 0.5 10*3/uL (ref 0.1–0.9)
Monocytes: 7 %
Neutrophils Absolute: 3.6 10*3/uL (ref 1.4–7.0)
Neutrophils: 51 %
Platelets: 232 10*3/uL (ref 150–450)
RBC: 4.43 x10E6/uL (ref 3.77–5.28)
RDW: 12.4 % (ref 11.7–15.4)
WBC: 7.1 10*3/uL (ref 3.4–10.8)

## 2021-01-21 LAB — VITAMIN B12: Vitamin B-12: 291 pg/mL (ref 232–1245)

## 2021-01-21 LAB — COMPREHENSIVE METABOLIC PANEL
ALT: 10 IU/L (ref 0–32)
AST: 13 IU/L (ref 0–40)
Albumin/Globulin Ratio: 2.2 (ref 1.2–2.2)
Albumin: 4.8 g/dL (ref 3.8–4.8)
Alkaline Phosphatase: 106 IU/L (ref 44–121)
BUN/Creatinine Ratio: 22 (ref 12–28)
BUN: 21 mg/dL (ref 8–27)
Bilirubin Total: 0.6 mg/dL (ref 0.0–1.2)
CO2: 22 mmol/L (ref 20–29)
Calcium: 9.6 mg/dL (ref 8.7–10.3)
Chloride: 103 mmol/L (ref 96–106)
Creatinine, Ser: 0.96 mg/dL (ref 0.57–1.00)
Globulin, Total: 2.2 g/dL (ref 1.5–4.5)
Glucose: 84 mg/dL (ref 65–99)
Potassium: 4.3 mmol/L (ref 3.5–5.2)
Sodium: 140 mmol/L (ref 134–144)
Total Protein: 7 g/dL (ref 6.0–8.5)
eGFR: 67 mL/min/{1.73_m2} (ref >59)

## 2021-01-21 LAB — VITAMIN D 25 HYDROXY (VIT D DEFICIENCY, FRACTURES): Vit D, 25-Hydroxy: 50.4 ng/mL (ref 30.0–100.0)

## 2021-01-21 LAB — TSH: TSH: 1.89 u[IU]/mL (ref 0.450–4.500)

## 2021-01-21 LAB — LIPID PANEL W/O CHOL/HDL RATIO
Cholesterol, Total: 213 mg/dL — ABNORMAL HIGH (ref 100–199)
HDL: 75 mg/dL (ref >39)
LDL Chol Calc (NIH): 125 mg/dL — ABNORMAL HIGH (ref 0–99)
Triglycerides: 72 mg/dL (ref 0–149)
VLDL Cholesterol Cal: 13 mg/dL (ref 5–40)

## 2021-01-21 NOTE — Progress Notes (Signed)
Contacted via [place] morning [place], your labs have returned and overall look great with only a couple exceptions.  Your cholesterol levels are above goal with LDL, bad cholesterol, at 125.  Previous was 60 with you consistently taking statin -- PLEASE take statin daily as this has shown benefit for you and can help prevent stroke or heart attack.  Very important.  B12 level is in normal range, but low end of normal.  I recommend you start taking B12 1000 MCG daily by mouth, you can obtain in any vitamin section.  This is good for nervous system health.  Overall everything else is fabulous!!  Any questions? Keep being awesome!!  Thank you for allowing me to participate in your care.  I appreciate you. Kindest regards, [place] Vierling

## 2021-01-24 ENCOUNTER — Other Ambulatory Visit: Payer: Self-pay

## 2021-01-24 MED ORDER — SUTAB 1479-225-188 MG PO TABS
1.0000 | ORAL_TABLET | ORAL | 0 refills | Status: DC
Start: 2021-01-24 — End: 2021-03-28

## 2021-02-21 ENCOUNTER — Encounter: Payer: Self-pay | Admitting: Gastroenterology

## 2021-02-21 ENCOUNTER — Ambulatory Visit: Payer: 59 | Admitting: Anesthesiology

## 2021-02-21 ENCOUNTER — Encounter
Admission: RE | Disposition: A | Discharge status: Home / Self Care | Payer: Self-pay | Source: Home / Self Care | Attending: Gastroenterology

## 2021-02-21 ENCOUNTER — Ambulatory Visit
Admission: RE | Admit: 2021-02-21 | Discharge: 2021-02-21 | Disposition: A | Discharge status: Home / Self Care | Payer: 59 | Attending: Gastroenterology | Admitting: Gastroenterology

## 2021-02-21 DIAGNOSIS — Z87892 Personal history of anaphylaxis: Secondary | ICD-10-CM | POA: Insufficient documentation

## 2021-02-21 DIAGNOSIS — Z880 Allergy status to penicillin: Secondary | ICD-10-CM | POA: Insufficient documentation

## 2021-02-21 DIAGNOSIS — F17210 Nicotine dependence, cigarettes, uncomplicated: Secondary | ICD-10-CM | POA: Insufficient documentation

## 2021-02-21 DIAGNOSIS — D124 Benign neoplasm of descending colon: Secondary | ICD-10-CM | POA: Insufficient documentation

## 2021-02-21 DIAGNOSIS — K621 Rectal polyp: Secondary | ICD-10-CM | POA: Insufficient documentation

## 2021-02-21 DIAGNOSIS — Z85828 Personal history of other malignant neoplasm of skin: Secondary | ICD-10-CM | POA: Insufficient documentation

## 2021-02-21 DIAGNOSIS — Z86010 Personal history of colonic polyps: Secondary | ICD-10-CM | POA: Diagnosis not present

## 2021-02-21 DIAGNOSIS — Z1211 Encounter for screening for malignant neoplasm of colon: Secondary | ICD-10-CM | POA: Insufficient documentation

## 2021-02-21 DIAGNOSIS — Z8719 Personal history of other diseases of the digestive system: Secondary | ICD-10-CM | POA: Insufficient documentation

## 2021-02-21 DIAGNOSIS — Z79899 Other long term (current) drug therapy: Secondary | ICD-10-CM | POA: Diagnosis not present

## 2021-02-21 DIAGNOSIS — D122 Benign neoplasm of ascending colon: Secondary | ICD-10-CM | POA: Diagnosis not present

## 2021-02-21 DIAGNOSIS — K635 Polyp of colon: Secondary | ICD-10-CM | POA: Diagnosis not present

## 2021-02-21 HISTORY — PX: COLONOSCOPY: SHX5424

## 2021-02-21 SURGERY — COLONOSCOPY
Anesthesia: General

## 2021-02-21 MED ORDER — PROPOFOL 500 MG/50ML IV EMUL
INTRAVENOUS | Status: DC | PRN
  Administered 2021-02-21: 150 ug/kg/min via INTRAVENOUS

## 2021-02-21 MED ORDER — PROPOFOL 10 MG/ML IV BOLUS
INTRAVENOUS | Status: DC | PRN
  Administered 2021-02-21: 10 mg via INTRAVENOUS
  Administered 2021-02-21: 80 mg via INTRAVENOUS
  Administered 2021-02-21: 10 mg via INTRAVENOUS

## 2021-02-21 MED ORDER — LIDOCAINE HCL (CARDIAC) PF 100 MG/5ML IV SOSY
PREFILLED_SYRINGE | INTRAVENOUS | Status: DC | PRN
  Administered 2021-02-21: 40 mg via INTRAVENOUS

## 2021-02-21 MED ORDER — PROPOFOL 500 MG/50ML IV EMUL
INTRAVENOUS | Status: AC
  Filled 2021-02-21: qty 50

## 2021-02-21 MED ORDER — SODIUM CHLORIDE 0.9 % IV SOLN
INTRAVENOUS | Status: DC
  Administered 2021-02-21: 1000 mL via INTRAVENOUS

## 2021-02-21 NOTE — H&P (Signed)
Jonathon Bellows, MD [address], [place], [place], [place], [address] [address], [place], [place], [place], [address] Phone: [phone]  Fax: [phone]  Primary Care Physician:  Venita Lick, NP   Pre-Procedure History & Physical: HPI:  Aimee Lawson is a 60 y.o. female is here for an colonoscopy.   Past Medical History:  Diagnosis Date   Atypical mole 11/04/2013   Right superior lateral pubic   No pertinent past medical history    Squamous cell carcinoma of skin 10/31/2017   right pretibial     Past Surgical History:  Procedure Laterality Date   BREAST ENHANCEMENT SURGERY     BREAST SURGERY     FACIAL COSMETIC SURGERY      Prior to Admission medications   Medication Sig Start Date End Date Taking? Authorizing Provider  albuterol (VENTOLIN HFA) 108 (90 Base) MCG/ACT inhaler Inhale 2 puffs into the lungs every 6 (six) hours as needed for wheezing or shortness of breath.   Yes [provider]  atorvastatin (LIPITOR) 10 MG tablet Take 1 tablet (10 mg total) by mouth daily. 01/20/21  Yes Marnee Guarneri T, NP  linaclotide (LINZESS) 72 MCG capsule Take 1 capsule (72 mcg total) by mouth daily before breakfast. 01/20/21  Yes Cannady, Jolene T, NP  Sodium Sulfate-Mag Sulfate-KCl (SUTAB) [phone] MG TABS Take 1 kit by mouth as directed. 01/24/21  Yes Jonathon Bellows, MD  umeclidinium-vilanterol [facility] ELLIPTA) 62.5-25 MCG/INH AEPB INHALE 1 PUFF BY MOUTH EVERY DAY 01/20/21  Yes Cannady, Henrine Screws T, NP  Zoster Vaccine Adjuvanted [facility]) injection Inject 0.5 mLs into the muscle once for 1 dose. Dose # 2 07/19/21 07/19/21  Marnee Guarneri T, NP    Allergies as of 01/23/2021 - Review Complete 01/20/2021  Allergen Reaction Noted   Penicillins Anaphylaxis 05/21/2014    Family History  Problem Relation Age of Onset   Heart attack Mother    Stroke Mother    Uterine cancer Mother    Colon cancer Mother    Heart attack Father    Hypertension Brother    Other  Brother        Pacemaker   Emphysema Brother    Diabetes Maternal Grandmother    Heart disease Maternal Grandmother    Heart disease Maternal Grandfather    Heart disease Paternal Grandmother    Heart disease Paternal Grandfather     Social History   Socioeconomic History   Marital status: Married    Spouse name: Not on file   Number of children: Not on file   Years of education: Not on file   Highest education level: Not on file  Occupational History   Not on file  Tobacco Use   Smoking status: Every Day    Packs/day: 1.50    Years: 44.00    Pack years: 66.00    Types: Cigarettes    Last attempt to quit: 03/13/2020    Years since quitting: 0.9   Smokeless tobacco: Never  Vaping Use   Vaping Use: Never used  Substance and Sexual Activity   Alcohol use: Yes    Comment: socially   Drug use: Never   Sexual activity: Not Currently  Other Topics Concern   Not on file  Social History Narrative   Not on file   Social Determinants of Health   Financial Resource Strain: Not on file  Food Insecurity: Not on file  Transportation Needs: Not on file  Physical Activity: Not on file  Stress: Not on  file  Social Connections: Not on file  Intimate Partner Violence: Not on file    Review of Systems: See HPI, otherwise negative ROS  Physical Exam: BP 117/73   Pulse 73   Temp (!) 96.4 F (35.8 C) (Temporal)   Resp 16   Ht 5' 3.75" (1.619 m)   Wt 76.8 kg   LMP 09/07/2010 (Approximate)   SpO2 99%   BMI 29.29 kg/m  General:   Alert,  pleasant and cooperative in NAD Head:  Normocephalic and atraumatic. Neck:  Supple; no masses or thyromegaly. Lungs:  Clear throughout to auscultation, normal respiratory effort.    Heart:  +S1, +S2, Regular rate and rhythm, No edema. Abdomen:  Soft, nontender and nondistended. Normal bowel sounds, without guarding, and without rebound.   Neurologic:  Alert and  oriented x4;  grossly normal neurologically.  Impression/Plan: Aimee Lawson is here for an colonoscopy to be performed for surveillance due to prior history of colon polyps   Risks, benefits, limitations, and alternatives regarding  colonoscopy have been reviewed with the patient.  Questions have been answered.  All parties agreeable.   Jonathon Bellows, MD  02/21/2021, 8:39 AM

## 2021-02-21 NOTE — Anesthesia Procedure Notes (Signed)
Date/Time: 02/21/2021 8:43 AM Performed by: Doreen Salvage, CRNA Pre-anesthesia Checklist: Patient identified, Emergency Drugs available, Suction available and Patient being monitored Patient Re-evaluated:Patient Re-evaluated prior to induction Oxygen Delivery Method: Nasal cannula Induction Type: IV induction Dental Injury: Teeth and Oropharynx as per pre-operative assessment  Comments: Nasal cannula with etCO2 monitoring

## 2021-02-21 NOTE — Anesthesia Postprocedure Evaluation (Signed)
Anesthesia Post Note  Patient: Aimee Lawson  Procedure(s) Performed: COLONOSCOPY  Patient location during evaluation: Endoscopy Anesthesia Type: General Level of consciousness: awake and alert Pain management: pain level controlled Vital Signs Assessment: post-procedure vital signs reviewed and stable Respiratory status: spontaneous breathing, nonlabored ventilation and respiratory function stable Cardiovascular status: blood pressure returned to baseline and stable Postop Assessment: no apparent nausea or vomiting Anesthetic complications: no   No notable events documented.   Last Vitals:  Vitals:   02/21/21 0935 02/21/21 0945  BP: 124/69 135/83  Pulse: 76 64  Resp: 14 14  Temp:    SpO2: 100% 100%    Last Pain:  Vitals:   02/21/21 0945  TempSrc:   PainSc: 0-No pain                 Iran Ouch

## 2021-02-21 NOTE — Anesthesia Preprocedure Evaluation (Signed)
Anesthesia Evaluation  Patient identified by MRN, date of birth, ID band  Reviewed: Allergy & Precautions, NPO status , Patient's Chart, lab work & pertinent test results  Airway Mallampati: II  TM Distance: >3 FB Neck ROM: Full    Dental no notable dental hx.    Pulmonary COPD,  COPD inhaler, Current Smoker and Patient abstained from smoking.,    Pulmonary exam normal breath sounds clear to auscultation       Cardiovascular Exercise Tolerance: Good negative cardio ROS Normal cardiovascular exam Rhythm:Regular Rate:Normal     Neuro/Psych negative neurological ROS  negative psych ROS   GI/Hepatic negative GI ROS, Neg liver ROS,   Endo/Other  negative endocrine ROS  Renal/GU negative Renal ROS     Musculoskeletal negative musculoskeletal ROS (+)   Abdominal Normal abdominal exam  (+)   Peds  Hematology negative hematology ROS (+)   Anesthesia Other Findings   Reproductive/Obstetrics                             Anesthesia Physical Anesthesia Plan  ASA: 3  Anesthesia Plan: General   Post-op Pain Management:    Induction: Intravenous  PONV Risk Score and Plan: 2 and TIVA and Propofol infusion  Airway Management Planned: Nasal Cannula and Natural Airway  Additional Equipment:   Intra-op Plan:   Post-operative Plan:   Informed Consent: I have reviewed the patients History and Physical, chart, labs and discussed the procedure including the risks, benefits and alternatives for the proposed anesthesia with the patient or authorized representative who has indicated his/her understanding and acceptance.     Dental advisory given  Plan Discussed with: Anesthesiologist and CRNA  Anesthesia Plan Comments:        Anesthesia Quick Evaluation

## 2021-02-21 NOTE — Transfer of Care (Signed)
Immediate Anesthesia Transfer of Care Note  Patient: Aimee Lawson  Procedure(s) Performed: COLONOSCOPY  Patient Location: PACU  Anesthesia Type:General  Level of Consciousness: awake and alert   Airway & Oxygen Therapy: Patient Spontanous Breathing  Post-op Assessment: Report given to RN and Post -op Vital signs reviewed and stable  Post vital signs: Reviewed and stable  Last Vitals:  Vitals Value Taken Time  BP 114/63 02/21/21 0925  Temp    Pulse 79 02/21/21 0926  Resp 20 02/21/21 0926  SpO2 99 % 02/21/21 0926  Vitals shown include unvalidated device data.  Last Pain:  Vitals:   02/21/21 0925  TempSrc:   PainSc: 0-No pain         Complications: No notable events documented.

## 2021-02-21 NOTE — Op Note (Signed)
[facility] Patient Name: Aimee Lawson Procedure Date: 02/21/2021 8:39 AM MRN: [id number] Account #: [id number] Date of Birth: February 16, 1961 Admit Type: Outpatient Age: 60 Room: [facility] ENDO ROOM 2 Gender: Female Note Status: Finalized Procedure:             Colonoscopy Indications:           Surveillance: Personal history of colonic polyps                         (unknown histology) on last colonoscopy more than 5                         years ago Providers:             Jonathon Bellows MD, MD Referring MD:          Henrine Screws T. Ned Card (Referring MD) Medicines:             Monitored Anesthesia Care Complications:         No immediate complications. Procedure:             Pre-Anesthesia Assessment:                        - Prior to the procedure, a History and Physical was                         performed, and patient medications, allergies and                         sensitivities were reviewed. The patient's tolerance                         of previous anesthesia was reviewed.                        - The risks and benefits of the procedure and the                         sedation options and risks were discussed with the                         patient. All questions were answered and informed                         consent was obtained.                        - ASA Grade Assessment: II - A patient with mild                         systemic disease.                        After obtaining informed consent, the colonoscope was                         passed under direct vision. Throughout the procedure,                         the patient's blood pressure, pulse, and oxygen  saturations were monitored continuously. The                         Colonoscope was introduced through the anus and                         advanced to the the cecum, identified by the                         appendiceal orifice. The colonoscopy was performed                          with ease. The patient tolerated the procedure well.                         The quality of the bowel preparation was good. Findings:      The perianal and digital rectal examinations were normal.      Seven sessile polyps were found in the descending colon. The polyps were       4 to 6 mm in size. These polyps were removed with a cold snare.       Resection and retrieval were complete.      Two sessile polyps were found in the transverse colon and ascending       colon. The polyps were 3 to 4 mm in size. These polyps were removed with       a cold biopsy forceps. Resection and retrieval were complete.      A 8 mm polyp was found in the cecum. The polyp was sessile. The polyp       was removed with a cold snare. Resection and retrieval were complete.      A 5 mm polyp was found in the rectum. The polyp was sessile. The polyp       was removed with a cold snare. Resection and retrieval were complete.      The exam was otherwise without abnormality on direct and retroflexion       views. Impression:            - Seven 4 to 6 mm polyps in the descending colon,                         removed with a cold snare. Resected and retrieved.                        - Two 3 to 4 mm polyps in the transverse colon and in                         the ascending colon, removed with a cold biopsy                         forceps. Resected and retrieved.                        - One 8 mm polyp in the cecum, removed with a cold                         snare. Resected and retrieved.                        -  One 5 mm polyp in the rectum, removed with a cold                         snare. Resected and retrieved.                        - The examination was otherwise normal on direct and                         retroflexion views. Recommendation:        - Discharge patient to home (with escort).                        - Resume previous diet.                        - Continue present  medications.                        - Await pathology results.                        - Repeat colonoscopy for surveillance based on                         pathology results. Procedure Code(s):     --- Professional ---                        [phone], Colonoscopy, flexible; with removal of                         tumor(s), polyp(s), or other lesion(s) by snare                         technique                        45380, 91, Colonoscopy, flexible; with biopsy, single                         or multiple Diagnosis Code(s):     --- Professional ---                        Z86.010, Personal history of colonic polyps                        K63.5, Polyp of colon                        K62.1, Rectal polyp CPT copyright 2019 [employer] Association. All rights reserved. The codes documented in this report are preliminary and upon coder review may  be revised to meet current compliance requirements. Jonathon Bellows, MD Jonathon Bellows MD, MD 02/21/2021 9:18:52 AM This report has been signed electronically. Number of Addenda: 0 Note Initiated On: 02/21/2021 8:39 AM Scope Withdrawal Time: 0 hours 20 minutes 41 seconds  Total Procedure Duration: 0 hours 30 minutes 10 seconds  Estimated Blood Loss:  Estimated blood loss: none.      [facility]

## 2021-02-22 ENCOUNTER — Encounter: Payer: Self-pay | Admitting: Gastroenterology

## 2021-02-22 LAB — SURGICAL PATHOLOGY

## 2021-03-01 ENCOUNTER — Encounter: Payer: Self-pay | Admitting: Gastroenterology

## 2021-03-23 ENCOUNTER — Other Ambulatory Visit: Payer: Self-pay

## 2021-03-23 ENCOUNTER — Ambulatory Visit (INDEPENDENT_AMBULATORY_CARE_PROVIDER_SITE_OTHER): Payer: 59 | Admitting: Dermatology

## 2021-03-23 DIAGNOSIS — L570 Actinic keratosis: Secondary | ICD-10-CM

## 2021-03-23 DIAGNOSIS — L578 Other skin changes due to chronic exposure to nonionizing radiation: Secondary | ICD-10-CM | POA: Diagnosis not present

## 2021-03-23 DIAGNOSIS — L820 Inflamed seborrheic keratosis: Secondary | ICD-10-CM

## 2021-03-23 DIAGNOSIS — L988 Other specified disorders of the skin and subcutaneous tissue: Secondary | ICD-10-CM

## 2021-03-23 DIAGNOSIS — D1800 Hemangioma unspecified site: Secondary | ICD-10-CM

## 2021-03-23 DIAGNOSIS — L812 Freckles: Secondary | ICD-10-CM

## 2021-03-23 DIAGNOSIS — Z1283 Encounter for screening for malignant neoplasm of skin: Secondary | ICD-10-CM | POA: Diagnosis not present

## 2021-03-23 DIAGNOSIS — L821 Other seborrheic keratosis: Secondary | ICD-10-CM

## 2021-03-23 DIAGNOSIS — D229 Melanocytic nevi, unspecified: Secondary | ICD-10-CM

## 2021-03-23 DIAGNOSIS — Z86018 Personal history of other benign neoplasm: Secondary | ICD-10-CM

## 2021-03-23 DIAGNOSIS — Z85828 Personal history of other malignant neoplasm of skin: Secondary | ICD-10-CM

## 2021-03-23 NOTE — Patient Instructions (Addendum)
Seborrheic Keratosis  What causes seborrheic keratoses? Seborrheic keratoses are harmless, common skin growths that first appear during adult life.  As time goes by, more growths appear.  Some people may develop a large number of them.  Seborrheic keratoses appear on both covered and uncovered body parts.  They are not caused by sunlight.  The tendency to develop seborrheic keratoses can be inherited.  They vary in color from skin-colored to gray, brown, or even black.  They can be either smooth or have a rough, warty surface.   Seborrheic keratoses are superficial and look as if they were stuck on the skin.  Under the microscope this type of keratosis looks like layers upon layers of skin.  That is why at times the top layer may seem to fall off, but the rest of the growth remains and re-grows.    Treatment Seborrheic keratoses do not need to be treated, but can easily be removed in the office.  Seborrheic keratoses often cause symptoms when they rub on clothing or jewelry.  Lesions can be in the way of shaving.  If they become inflamed, they can cause itching, soreness, or burning.  Removal of a seborrheic keratosis can be accomplished by freezing, burning, or surgery. If any spot bleeds, scabs, or grows rapidly, please return to have it checked, as these can be an indication of a skin cancer.    Actinic keratoses are precancerous spots that appear secondary to cumulative UV radiation exposure/sun exposure over time. They are chronic with expected duration over 1 year. A portion of actinic keratoses will progress to squamous cell carcinoma of the skin. It is not possible to reliably predict which spots will progress to skin cancer and so treatment is recommended to prevent development of skin cancer.  Recommend daily broad spectrum sunscreen SPF 30+ to sun-exposed areas, reapply every 2 hours as needed.  Recommend staying in the shade or wearing long sleeves, sun glasses (UVA+UVB protection) and  wide brim hats (4-inch brim around the entire circumference of the hat). Call for new or changing lesions.   Cryotherapy Aftercare  Wash gently with soap and water everyday.   Apply Vaseline and Band-Aid daily until healed.   Melanoma ABCDEs  Melanoma is the most dangerous type of skin cancer, and is the leading cause of death from skin disease.  You are more likely to develop melanoma if you: Have light-colored skin, light-colored eyes, or red or blond hair Spend a lot of time in the sun Tan regularly, either outdoors or in a tanning bed Have had blistering sunburns, especially during childhood Have a close family member who has had a melanoma Have atypical moles or large birthmarks  Early detection of melanoma is key since treatment is typically straightforward and cure rates are extremely high if we catch it early.   The first sign of melanoma is often a change in a mole or a new dark spot.  The ABCDE system is a way of remembering the signs of melanoma.  A for asymmetry:  The two halves do not match. B for border:  The edges of the growth are irregular. C for color:  A mixture of colors are present instead of an even brown color. D for diameter:  Melanomas are usually (but not always) greater than 6mm - the size of a pencil eraser. E for evolution:  The spot keeps changing in size, shape, and color.  Please check your skin once per month between visits. You can use a   in front and a large mirror behind you to keep an eye on the back side or your body.   If you see any new or changing lesions before your next follow-up, please call to schedule a visit.  Please continue daily skin protection including broad spectrum sunscreen SPF 30+ to sun-exposed areas, reapplying every 2 hours as needed when you're outdoors.   Staying in the shade or wearing long sleeves, sun glasses (UVA+UVB protection) and wide brim hats (4-inch brim around the entire circumference of the hat) are also  recommended for sun protection.    If you have any questions or concerns for your doctor, please call our main line at [phone] and press option 4 to reach your doctor's medical assistant. If no one answers, please leave a voicemail as directed and we will return your call as soon as possible. Messages left after 4 pm will be answered the following business day.   You may also send [place] a message via [facility]. We typically respond to MyChart messages within 1-2 business days.  For prescription refills, please ask your pharmacy to contact our office. Our fax number is [phone].  If you have an urgent issue when the clinic is closed that cannot wait until the next business day, you can page your doctor at the number below.    Please note that while we do our best to be available for urgent issues outside of office hours, we are not available 24/7.   If you have an urgent issue and are unable to reach [place], you may choose to seek medical care at your doctor's office, retail clinic, urgent care center, or emergency room.  If you have a medical emergency, please immediately call 911 or go to the emergency department.  Pager Numbers  - Dr. Nehemiah Massed: [phone]  - Dr. Laurence Ferrari: [phone]  - Dr. Nicole Kindred: [phone]  In the event of inclement weather, please call our main line at [phone] for an update on the status of any delays or closures.  Dermatology Medication Tips: Please keep the boxes that topical medications come in in order to help keep track of the instructions about where and how to use these. Pharmacies typically print the medication instructions only on the boxes and not directly on the medication tubes.   If your medication is too expensive, please contact our office at [phone] option 4 or send [place] a message through [facility].   We are unable to tell what your co-pay for medications will be in advance as this is different depending on your insurance coverage.  However, we may be able to find a substitute medication at lower cost or fill out paperwork to get insurance to cover a needed medication.   If a prior authorization is required to get your medication covered by your insurance company, please allow [place] 1-2 business days to complete this process.  Drug prices often vary depending on where the prescription is filled and some pharmacies may offer cheaper prices.  The website www.goodrx.com contains coupons for medications through different pharmacies. The prices here do not account for what the cost may be with help from insurance (it may be cheaper with your insurance), but the website can give you the price if you did not use any insurance.  - You can print the associated coupon and take it with your prescription to the pharmacy.  - You may also stop by our office during regular business hours and pick up a GoodRx coupon card.  - If  you need your prescription sent electronically to a different pharmacy, notify our office through [facility] or by phone at [phone] option 4.

## 2021-03-23 NOTE — Progress Notes (Signed)
Follow-Up Visit   Subjective  Aimee Lawson is a 60 y.o. female who presents for the following: Follow-up (Patient here today for fbse. Patient states she has history of precancers but no new spots of concern. Patient has history of skin cancers.  ). Patient here for full body skin exam and skin cancer screening.  The following portions of the chart were reviewed this encounter and updated as appropriate:  Tobacco  Allergies  Meds  Problems  Med Hx  Surg Hx  Fam Hx     Review of Systems: No other skin or systemic complaints except as noted in HPI or Assessment and Plan.  Objective  Well appearing patient in no apparent distress; mood and affect are within normal limits.  A full examination was performed including scalp, head, eyes, ears, nose, lips, neck, chest, axillae, abdomen, back, buttocks, bilateral upper extremities, bilateral lower extremities, hands, feet, fingers, toes, fingernails, and toenails. All findings within normal limits unless otherwise noted below.  forehead x 8, right neck x 2, chest x 6 (16) Erythematous thin papules/macules with gritty scale.   left nasal root x 1, left shoulder x 1, right shoulder x 4, arms / legs x 12 (18) Erythematous keratotic or waxy stuck-on papule or plaque.   face Rhytides and volume loss.   Assessment & Plan  Actinic keratosis (16) forehead x 8, right neck x 2, chest x 6  Actinic keratoses are precancerous spots that appear secondary to cumulative UV radiation exposure/sun exposure over time. They are chronic with expected duration over 1 year. A portion of actinic keratoses will progress to squamous cell carcinoma of the skin. It is not possible to reliably predict which spots will progress to skin cancer and so treatment is recommended to prevent development of skin cancer.  Recommend daily broad spectrum sunscreen SPF 30+ to sun-exposed areas, reapply every 2 hours as needed.  Recommend staying in the shade or wearing  long sleeves, sun glasses (UVA+UVB protection) and wide brim hats (4-inch brim around the entire circumference of the hat).  Actinic Damage - Severe, confluent actinic changes with pre-cancerous actinic keratoses  - Severe, chronic, not at goal, secondary to cumulative UV radiation exposure over time - diffuse scaly erythematous macules and papules with underlying dyspigmentation - Discussed Prescription "Field Treatment" for Severe, Chronic Confluent Actinic Changes with Pre-Cancerous Actinic Keratoses Field treatment involves treatment of an entire area of skin that has confluent Actinic Changes (Sun/ Ultraviolet light damage) and PreCancerous Actinic Keratoses by method of PhotoDynamic Therapy (PDT) and/or prescription Topical Chemotherapy agents such as 5-fluorouracil, 5-fluorouracil/calcipotriene, and/or imiquimod.  The purpose is to decrease the number of clinically evident and subclinical PreCancerous lesions to prevent progression to development of skin cancer by chemically destroying early precancer changes that may or may not be visible.  It has been shown to reduce the risk of developing skin cancer in the treated area. As a result of treatment, redness, scaling, crusting, and open sores may occur during treatment course. One or more than one of these methods may be used and may have to be used several times to control, suppress and eliminate the PreCancerous changes. Discussed treatment course, expected reaction, and possible side effects. - Recommend daily broad spectrum sunscreen SPF 30+ to sun-exposed areas, reapply every 2 hours as needed.  - Staying in the shade or wearing long sleeves, sun glasses (UVA+UVB protection) and wide brim hats (4-inch brim around the entire circumference of the hat) are also recommended. - Call for  new or changing lesions.  - Will schedule photodynamic therapy to the face and chest about a month apart  Destruction of lesion - forehead x 8, right neck x 2,  chest x 6 Complexity: simple   Destruction method: cryotherapy   Informed consent: discussed and consent obtained   Timeout:  patient name, date of birth, surgical site, and procedure verified Lesion destroyed using liquid nitrogen: Yes   Region frozen until ice ball extended beyond lesion: Yes   Outcome: patient tolerated procedure well with no complications   Post-procedure details: wound care instructions given    Inflamed seborrheic keratosis left nasal root x 1, left shoulder x 1, right shoulder x 4, arms / legs x 12  Destruction of lesion - left nasal root x 1, left shoulder x 1, right shoulder x 4, arms / legs x 12 Complexity: simple   Destruction method: cryotherapy   Informed consent: discussed and consent obtained   Timeout:  patient name, date of birth, surgical site, and procedure verified Lesion destroyed using liquid nitrogen: Yes   Region frozen until ice ball extended beyond lesion: Yes   Outcome: patient tolerated procedure well with no complications   Post-procedure details: wound care instructions given    Elastosis of skin face Patient wanted to discuss fillers at lips and face Discussed restylane refyne for nasolabial folds and oral commissures  Juvederm Volbella at lips and vermillion borders   Skin cancer screening  Lentigines - Scattered tan macules - Due to sun exposure - Benign-appearing, observe - Recommend daily broad spectrum sunscreen SPF 30+ to sun-exposed areas, reapply every 2 hours as needed. - Call for any changes  Seborrheic Keratoses - Stuck-on, waxy, tan-brown papules and/or plaques  - Benign-appearing - Discussed benign etiology and prognosis. - Observe - Call for any changes  Melanocytic Nevi - Tan-brown and/or pink-flesh-colored symmetric macules and papules - Benign appearing on exam today - Observation - Call clinic for new or changing moles - Recommend daily use of broad spectrum spf 30+ sunscreen to sun-exposed areas.    Hemangiomas - Red papules - Discussed benign nature - Observe - Call for any changes  Actinic Damage - Chronic condition, secondary to cumulative UV/sun exposure - diffuse scaly erythematous macules with underlying dyspigmentation - Recommend daily broad spectrum sunscreen SPF 30+ to sun-exposed areas, reapply every 2 hours as needed.  - Staying in the shade or wearing long sleeves, sun glasses (UVA+UVB protection) and wide brim hats (4-inch brim around the entire circumference of the hat) are also recommended for sun protection.  - Call for new or changing lesions.  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  - Call if any new or changing lesions are noted between office visits  History of Squamous Cell Carcinoma of the Skin - No evidence of recurrence today - No lymphadenopathy - Recommend regular full body skin exams - Recommend daily broad spectrum sunscreen SPF 30+ to sun-exposed areas, reapply every 2 hours as needed.  - Call if any new or changing lesions are noted between office visits  - Will schedule photodynamic therapy to the face and chest separately about a month apart   Skin cancer screening performed today.  Return for schedule pdt for face and chest seperately about a month apart, 6 month follow up Dr. Gwenyth Allegra, CMA, am acting  as scribe for Sarina Ser, MD. Documentation: I have reviewed the above documentation for accuracy and completeness, and I agree with the above.  Sarina Ser, MD

## 2021-03-25 ENCOUNTER — Encounter: Payer: Self-pay | Admitting: Dermatology

## 2021-03-28 ENCOUNTER — Ambulatory Visit (INDEPENDENT_AMBULATORY_CARE_PROVIDER_SITE_OTHER): Payer: 59 | Admitting: Nurse Practitioner

## 2021-03-28 ENCOUNTER — Other Ambulatory Visit: Payer: Self-pay

## 2021-03-28 ENCOUNTER — Ambulatory Visit: Payer: Self-pay

## 2021-03-28 ENCOUNTER — Encounter: Payer: Self-pay | Admitting: Nurse Practitioner

## 2021-03-28 VITALS — BP 108/72 | HR 86 | Temp 97.9°F | Ht 64.5 in | Wt 171.8 lb

## 2021-03-28 DIAGNOSIS — R1012 Left upper quadrant pain: Secondary | ICD-10-CM

## 2021-03-28 LAB — URINALYSIS, ROUTINE W REFLEX MICROSCOPIC
Bilirubin, UA: NEGATIVE
Glucose, UA: NEGATIVE
Ketones, UA: NEGATIVE
Leukocytes,UA: NEGATIVE
Nitrite, UA: NEGATIVE
Protein,UA: NEGATIVE
RBC, UA: NEGATIVE
Specific Gravity, UA: 1.01 (ref 1.005–1.030)
Urobilinogen, Ur: 0.2 mg/dL (ref 0.2–1.0)
pH, UA: 5.5 (ref 5.0–7.5)

## 2021-03-28 NOTE — Telephone Encounter (Signed)
Pt. Having upper , left-sided abdominal pain x 2 days. Sometimes pain is sharp. Hurts with movement as well. No other symptoms. Appointment today.

## 2021-03-28 NOTE — Progress Notes (Signed)
Hi Aimee Lawson. Your urine from today looks good.  I will send you another message once the rest of your lab work comes back.

## 2021-03-28 NOTE — Telephone Encounter (Signed)
Reason for Disposition  [1] MILD-MODERATE pain AND [2] constant AND [3] present > 2 hours  Answer Assessment - Initial Assessment Questions 1. LOCATION: "Where does it hurt?"      Upper left 2. RADIATION: "Does the pain shoot anywhere else?" (e.g., chest, back)     No 3. ONSET: "When did the pain begin?" (e.g., minutes, hours or days ago)      Sunday 4. SUDDEN: "Gradual or sudden onset?"     Gradual 5. PATTERN "Does the pain come and go, or is it constant?"    - If constant: "Is it getting better, staying the same, or worsening?"      (Note: Constant means the pain never goes away completely; most serious pain is constant and it progresses)     - If intermittent: "How long does it last?" "Do you have pain now?"     (Note: Intermittent means the pain goes away completely between bouts)     Constant 6. SEVERITY: "How bad is the pain?"  (e.g., Scale 1-10; mild, moderate, or severe)   - MILD (1-3): doesn't interfere with normal activities, abdomen soft and not tender to touch    - MODERATE (4-7): interferes with normal activities or awakens from sleep, abdomen tender to touch    - SEVERE (8-10): excruciating pain, doubled over, unable to do any normal activities      Now - 5 7. RECURRENT SYMPTOM: "Have you ever had this type of stomach pain before?" If Yes, ask: "When was the last time?" and "What happened that time?"      No 8. CAUSE: "What do you think is causing the stomach pain?"     Unsure 9. RELIEVING/AGGRAVATING FACTORS: "What makes it better or worse?" (e.g., movement, antacids, bowel movement)     No 10. OTHER SYMPTOMS: "Do you have any other symptoms?" (e.g., back pain, diarrhea, fever, urination pain, vomiting)       No 11. PREGNANCY: "Is there any chance you are pregnant?" "When was your last menstrual period?"       No  Protocols used: Abdominal Pain - [facility]

## 2021-03-28 NOTE — Progress Notes (Signed)
BP 108/72   Pulse 86   Temp 97.9 F (36.6 C) (Oral)   Ht 5' 4.5" (1.638 m)   Wt 171 lb 12.8 oz (77.9 kg)   LMP 09/07/2010 (Approximate)   SpO2 98%   BMI 29.03 kg/m    Subjective:    Patient ID: Aimee Lawson, female    DOB: 1961/02/07, 60 y.o.   MRN: [id number]  HPI: Aimee Lawson is a 60 y.o. female  Chief Complaint  Patient presents with   Abdominal Pain    Pt states she has been having Lside pain for the past 3 to 4 days. States it is a sharp pain   ABDOMINAL PAIN  Duration:days Onset: sudden Severity: 4/10 Quality: sharp- patient states yesterday the pain was sharp when she was breath normal but today that has improved.  Patient states today the pain is when she deep breaths.  Patient also feel bloated. Location:  LUQ  Episode duration:  Radiation: no Frequency: constant Alleviating factors:  Aggravating factors: Status: better Treatments attempted: none Fever: no Nausea: no Vomiting: no Weight loss: no Decreased appetite: yes Diarrhea: no Constipation: no Blood in stool: no Heartburn: no Jaundice: no Rash: no Dysuria/urinary frequency: no Hematuria: no Recurrent NSAID use: no  Relevant past medical, surgical, family and social history reviewed and updated as indicated. Interim medical history since our last visit reviewed. Allergies and medications reviewed and updated.  Review of Systems  Constitutional:  Negative for fever.  Gastrointestinal:  Positive for abdominal pain. Negative for blood in stool, constipation, diarrhea and nausea.       Bloating   Per HPI unless specifically indicated above     Objective:    BP 108/72   Pulse 86   Temp 97.9 F (36.6 C) (Oral)   Ht 5' 4.5" (1.638 m)   Wt 171 lb 12.8 oz (77.9 kg)   LMP 09/07/2010 (Approximate)   SpO2 98%   BMI 29.03 kg/m   Wt Readings from Last 3 Encounters:  03/28/21 171 lb 12.8 oz (77.9 kg)  02/21/21 169 lb 5.4 oz (76.8 kg)  01/20/21 169 lb (76.7 kg)    Physical  Exam Vitals and nursing note reviewed.  Constitutional:      General: She is not in acute distress.    Appearance: Normal appearance. She is normal weight. She is not ill-appearing, toxic-appearing or diaphoretic.  HENT:     Head: Normocephalic.     Right Ear: External ear normal.     Left Ear: External ear normal.     Nose: Nose normal.     Mouth/Throat:     Mouth: Mucous membranes are moist.     Pharynx: Oropharynx is clear.  Eyes:     General:        Right eye: No discharge.        Left eye: No discharge.     Extraocular Movements: Extraocular movements intact.     Conjunctiva/sclera: Conjunctivae normal.     Pupils: Pupils are equal, round, and reactive to light.  Cardiovascular:     Rate and Rhythm: Normal rate and regular rhythm.     Heart sounds: No murmur heard. Pulmonary:     Effort: Pulmonary effort is normal. No respiratory distress.     Breath sounds: Normal breath sounds. No wheezing or rales.  Abdominal:     General: Bowel sounds are normal.     Tenderness: There is no abdominal tenderness. There is no right CVA tenderness, left CVA tenderness  or guarding. Negative signs include Murphy's sign.  Musculoskeletal:     Cervical back: Normal range of motion and neck supple.  Skin:    General: Skin is warm and dry.     Capillary Refill: Capillary refill takes less than 2 seconds.  Neurological:     General: No focal deficit present.     Mental Status: She is alert and oriented to person, place, and time. Mental status is at baseline.  Psychiatric:        Mood and Affect: Mood normal.        Behavior: Behavior normal.        Thought Content: Thought content normal.        Judgment: Judgment normal.    Results for orders placed or performed during the hospital encounter of 02/21/21  Surgical pathology  Result Value Ref Range   SURGICAL PATHOLOGY      SURGICAL PATHOLOGY CASE: [phone] PATIENT: Aimee Lawson Surgical Pathology Report     Specimen  Submitted: A. Colon polyp x5, descending; cold snare B. Colon polyp, add descending; cold snare C. Colon polyp, ascending; cbx D. Colon polyp, cecum; cold snare E. Colon polyp, transverse; cbx F. Rectum polyp x2; cold snare  Clinical History: History of colon polyps      DIAGNOSIS: A. COLON POLYP X5, DESCENDING; COLD SNARE: - HYPERPLASTIC POLYP, MULTIPLE FRAGMENTS. - NEGATIVE FOR DYSPLASIA AND MALIGNANCY.  B. COLON POLYP, ADDITIONAL DESCENDING; COLD SNARE: - TUBULAR ADENOMA. - NEGATIVE FOR HIGH GRADE DYSPLASIA AND MALIGNANCY.  C. COLON POLYP, ASCENDING; COLD SNARE: - TUBULAR ADENOMA. - NEGATIVE FOR HIGH GRADE DYSPLASIA AND MALIGNANCY.  D. COLON POLYP, CECUM; COLD SNARE: - TUBULAR ADENOMA. - NEGATIVE FOR HIGH GRADE DYSPLASIA AND MALIGNANCY.  E. COLON POLYP, TRANSVERSE; COLD BIOPSY: - HYPERPLASTIC POLYP. - NEGATIVE FOR DYSPLASIA AND MALIGNANCY.   F. RECTUM POLYP X2; COLD SNARE: - HYPERPLASTIC POLYP, TWO FRAGMENTS. - NEGATIVE FOR DYSPLASIA AND MALIGNANCY.  GROSS DESCRIPTION: A. Labeled: Descending colon polyp cold snare x5 Received: Formalin Collection time: 8:48 AM on 02/21/2021 Placed into formalin time: 8:48 AM on 02/21/2021 Tissue fragment(s): Multiple Size: Aggregate, 2.1 x 0.7 x 0.2 cm Description: Received are fragments of tan-pink elongated soft tissue admixed with intestinal debris.  The ratio of soft tissue to intestinal debris is 90: 10. Entirely submitted in 1 cassette.  B. Labeled: Colon polyp cold snare x1 (additional descending) Received: Formalin Collection time: 8:51 AM on 02/21/2021 Placed into formalin time: 8:51 AM on 02/21/2021 Tissue fragment(s): Multiple Size: Aggregate, 2.4 x 0.5 x 0.2 cm Description: Received is at least 1 fragment of tan soft tissue admixed with intestinal debris.  The ratio of soft tissue to intestinal debris is 10: 90. Entirely submitted in 1 cassette.  C. Lab eled: Ascending colon polyp cbx x1 Received:  Formalin Collection time: 8:59 AM on 02/21/2021 Placed into formalin time: 8:59 AM on 02/21/2021 Tissue fragment(s): 1 Size: 0.8 x 0.2 x 0.1 cm Description: Tan soft tissue fragment Entirely submitted in 1 cassette.  D. Labeled: Cecum polyp cold snare x1 Received: Formalin Collection time: 9 AM on 02/21/2021 Placed into formalin time: 9 AM on 02/21/2021 Tissue fragment(s): Multiple Size: Aggregate, 2.6 x 0.6 x 0.2 cm Description: Received is a single fragment of tan soft tissue admixed with intestinal debris.  The ratio of soft tissue to intestinal debris is 60: 40. Entirely submitted in 1 cassette.  E. Labeled: Transverse colon polyp cbx x1 Received: Formalin Collection time: 9:05 AM on 02/21/2021 Placed into formalin time: 9:05  AM on 02/21/2021 Tissue fragment(s): 2 Size: Range from 0.2-0.5 cm Description: Tan soft tissue fragments Entirely submitted in 1 cassette.  F. Labeled: Rectum polyp cold snare x2 Received: F ormalin Collection time: 9:14 AM on 02/21/2021 Placed into formalin time: 9:14 AM on 02/21/2021 Tissue fragment(s): Multiple Size: Aggregate, 2.4 x 0.6 x 0.3 cm Description: Received are at least 2 fragments of tan soft tissue admixed with intestinal debris.  The ratio of soft tissue to intestinal debris is 40: 60. Entirely submitted in 1 cassette.  RB 02/21/2021  Final Diagnosis performed by Betsy Pries, MD.   Electronically signed 02/22/2021 9:58:25AM The electronic signature indicates that the named Attending Pathologist has evaluated the specimen Technical component performed at [facility], [address], [place], [place] [address] Lab: [phone] Dir: Rush Farmer, MD, MMM  Professional component performed at [facility], [facility], [place], [place], [place] [address] Lab: [phone] Dir: Dellia Nims. Reuel Derby, MD       Assessment & Plan:   Problem List Items Addressed This Visit   None Visit Diagnoses     Left upper quadrant  abdominal pain    -  Primary   Will obtain labs. If lab work is normal. Recommend OTC Gas X, prilosec and a bland diet. If symptoms do not resolve RTC. Will make recommendations based on labs   Relevant Orders   Comp Met (CMET)   CBC w/Diff   Urinalysis, Routine w reflex microscopic   Amylase   Lipase        Follow up plan: Return if symptoms worsen or fail to improve.

## 2021-03-29 LAB — COMPREHENSIVE METABOLIC PANEL
ALT: 14 IU/L (ref 0–32)
AST: 17 IU/L (ref 0–40)
Albumin/Globulin Ratio: 2 (ref 1.2–2.2)
Albumin: 4.5 g/dL (ref 3.8–4.8)
Alkaline Phosphatase: 113 IU/L (ref 44–121)
BUN/Creatinine Ratio: 19 (ref 12–28)
BUN: 16 mg/dL (ref 8–27)
Bilirubin Total: 0.6 mg/dL (ref 0.0–1.2)
CO2: 24 mmol/L (ref 20–29)
Calcium: 9.4 mg/dL (ref 8.7–10.3)
Chloride: 101 mmol/L (ref 96–106)
Creatinine, Ser: 0.85 mg/dL (ref 0.57–1.00)
Globulin, Total: 2.3 g/dL (ref 1.5–4.5)
Glucose: 75 mg/dL (ref 65–99)
Potassium: 3.8 mmol/L (ref 3.5–5.2)
Sodium: 141 mmol/L (ref 134–144)
Total Protein: 6.8 g/dL (ref 6.0–8.5)
eGFR: 77 mL/min/{1.73_m2} (ref >59)

## 2021-03-29 LAB — CBC WITH DIFFERENTIAL/PLATELET
Basophils Absolute: 0 10*3/uL (ref 0.0–0.2)
Basos: 1 %
EOS (ABSOLUTE): 0.1 10*3/uL (ref 0.0–0.4)
Eos: 1 %
Hematocrit: 37 % (ref 34.0–46.6)
Hemoglobin: 12.6 g/dL (ref 11.1–15.9)
Immature Grans (Abs): 0 10*3/uL (ref 0.0–0.1)
Immature Granulocytes: 0 %
Lymphocytes Absolute: 2.8 10*3/uL (ref 0.7–3.1)
Lymphs: 40 %
MCH: 31.3 pg (ref 26.6–33.0)
MCHC: 34.1 g/dL (ref 31.5–35.7)
MCV: 92 fL (ref 79–97)
Monocytes Absolute: 0.5 10*3/uL (ref 0.1–0.9)
Monocytes: 7 %
Neutrophils Absolute: 3.6 10*3/uL (ref 1.4–7.0)
Neutrophils: 51 %
Platelets: 215 10*3/uL (ref 150–450)
RBC: 4.03 x10E6/uL (ref 3.77–5.28)
RDW: 12.1 % (ref 11.7–15.4)
WBC: 7 10*3/uL (ref 3.4–10.8)

## 2021-03-29 LAB — AMYLASE: Amylase: 31 U/L (ref 31–110)

## 2021-03-29 LAB — LIPASE: Lipase: 22 U/L (ref 14–72)

## 2021-03-29 NOTE — Progress Notes (Signed)
Hi Aimee Lawson. It was nice to meet you yesterday.  Your lab work looks great.  No concerns at all.  Continue with the plan as discussed.  Follow up if your symptoms do not improve.

## 2021-04-26 ENCOUNTER — Other Ambulatory Visit: Payer: Self-pay

## 2021-04-26 ENCOUNTER — Ambulatory Visit (INDEPENDENT_AMBULATORY_CARE_PROVIDER_SITE_OTHER): Payer: 59

## 2021-04-26 ENCOUNTER — Ambulatory Visit: Payer: 59

## 2021-04-26 DIAGNOSIS — L570 Actinic keratosis: Secondary | ICD-10-CM

## 2021-04-26 MED ORDER — AMINOLEVULINIC ACID HCL 20 % EX SOLR
1.0000 "application " | Freq: Once | CUTANEOUS | Status: AC
  Administered 2021-04-26: 354 mg via TOPICAL

## 2021-04-26 NOTE — Patient Instructions (Signed)

## 2021-04-26 NOTE — Progress Notes (Signed)
Patient completed PDT therapy today.  1. AK (actinic keratosis) Head - Anterior (Face)  Photodynamic therapy - Head - Anterior (Face) Procedure discussed: discussed risks, benefits, side effects. and alternatives   Prep: site scrubbed/prepped with acetone   Location:  Face Number of lesions:  Multiple Type of treatment:  Blue light Aminolevulinic Acid (see MAR for details): Levulan Number of Levulan sticks used:  1 Incubation time (minutes):  60 Number of minutes under lamp:  16 Number of seconds under lamp:  40 Cooling:  Floor fan Outcome: patient tolerated procedure well with no complications   Post-procedure details: sunscreen applied    Aminolevulinic Acid HCl 20 % SOLR 354 mg - Head - Anterior (Face)

## 2021-05-24 ENCOUNTER — Ambulatory Visit (INDEPENDENT_AMBULATORY_CARE_PROVIDER_SITE_OTHER): Payer: 59

## 2021-05-24 ENCOUNTER — Other Ambulatory Visit: Payer: Self-pay

## 2021-05-24 DIAGNOSIS — L570 Actinic keratosis: Secondary | ICD-10-CM

## 2021-05-24 MED ORDER — AMINOLEVULINIC ACID HCL 20 % EX SOLR
1.0000 "application " | Freq: Once | CUTANEOUS | Status: AC
  Administered 2021-05-24: 354 mg via TOPICAL

## 2021-05-24 NOTE — Patient Instructions (Signed)

## 2021-05-24 NOTE — Progress Notes (Signed)
1. AK (actinic keratosis) Chest  Photodynamic therapy - Chest Procedure discussed: discussed risks, benefits, side effects. and alternatives   Prep: site scrubbed/prepped with acetone   Location:  Chest Number of lesions:  Multiple Type of treatment:  Blue light Aminolevulinic Acid (see MAR for details): Levulan Number of Levulan sticks used:  1 Incubation time (minutes):  120 Number of minutes under lamp:  16 Number of seconds under lamp:  40 Cooling:  Floor fan Outcome: patient tolerated procedure well with no complications   Post-procedure details: sunscreen applied and aftercare instructions given to patient    Aminolevulinic Acid HCl 20 % SOLR 354 mg - Chest

## 2021-07-17 ENCOUNTER — Other Ambulatory Visit: Payer: Self-pay

## 2021-07-17 ENCOUNTER — Ambulatory Visit: Payer: 59 | Admitting: Dermatology

## 2021-07-17 DIAGNOSIS — L820 Inflamed seborrheic keratosis: Secondary | ICD-10-CM | POA: Diagnosis not present

## 2021-07-17 DIAGNOSIS — L821 Other seborrheic keratosis: Secondary | ICD-10-CM

## 2021-07-17 DIAGNOSIS — L578 Other skin changes due to chronic exposure to nonionizing radiation: Secondary | ICD-10-CM | POA: Diagnosis not present

## 2021-07-17 DIAGNOSIS — L570 Actinic keratosis: Secondary | ICD-10-CM | POA: Diagnosis not present

## 2021-07-17 NOTE — Patient Instructions (Signed)
Seborrheic Keratosis  What causes seborrheic keratoses? Seborrheic keratoses are harmless, common skin growths that first appear during adult life.  As time goes by, more growths appear.  Some people may develop a large number of them.  Seborrheic keratoses appear on both covered and uncovered body parts.  They are not caused by sunlight.  The tendency to develop seborrheic keratoses can be inherited.  They vary in color from skin-colored to gray, brown, or even black.  They can be either smooth or have a rough, warty surface.   Seborrheic keratoses are superficial and look as if they were stuck on the skin.  Under the microscope this type of keratosis looks like layers upon layers of skin.  That is why at times the top layer may seem to fall off, but the rest of the growth remains and re-grows.    Treatment Seborrheic keratoses do not need to be treated, but can easily be removed in the office.  Seborrheic keratoses often cause symptoms when they rub on clothing or jewelry.  Lesions can be in the way of shaving.  If they become inflamed, they can cause itching, soreness, or burning.  Removal of a seborrheic keratosis can be accomplished by freezing, burning, or surgery. If any spot bleeds, scabs, or grows rapidly, please return to have it checked, as these can be an indication of a skin cancer.  Cryotherapy Aftercare  Wash gently with soap and water everyday.   Apply Vaseline and Band-Aid daily until healed.   Actinic keratoses are precancerous spots that appear secondary to cumulative UV radiation exposure/sun exposure over time. They are chronic with expected duration over 1 year. A portion of actinic keratoses will progress to squamous cell carcinoma of the skin. It is not possible to reliably predict which spots will progress to skin cancer and so treatment is recommended to prevent development of skin cancer.  Recommend daily broad spectrum sunscreen SPF 30+ to sun-exposed areas, reapply every  2 hours as needed.  Recommend staying in the shade or wearing long sleeves, sun glasses (UVA+UVB protection) and wide brim hats (4-inch brim around the entire circumference of the hat). Call for new or changing lesions.    If You Need Anything After Your Visit  If you have any questions or concerns for your doctor, please call our main line at [phone] and press option 4 to reach your doctor's medical assistant. If no one answers, please leave a voicemail as directed and we will return your call as soon as possible. Messages left after 4 pm will be answered the following business day.   You may also send [place] a message via [place]. We typically respond to MyChart messages within 1-2 business days.  For prescription refills, please ask your pharmacy to contact our office. Our fax number is [phone].  If you have an urgent issue when the clinic is closed that cannot wait until the next business day, you can page your doctor at the number below.    Please note that while we do our best to be available for urgent issues outside of office hours, we are not available 24/7.   If you have an urgent issue and are unable to reach [place], you may choose to seek medical care at your doctor's office, retail clinic, urgent care center, or emergency room.  If you have a medical emergency, please immediately call 911 or go to the emergency department.  Pager Numbers  - Dr. Nehemiah Massed: [phone]  - Dr. Laurence Ferrari: [phone]  -  Dr. Nicole Kindred: [phone]  In the event of inclement weather, please call our main line at [phone] for an update on the status of any delays or closures.  Dermatology Medication Tips: Please keep the boxes that topical medications come in in order to help keep track of the instructions about where and how to use these. Pharmacies typically print the medication instructions only on the boxes and not directly on the medication tubes.   If your medication is too expensive,  please contact our office at [phone] option 4 or send [place] a message through [place].   We are unable to tell what your co-pay for medications will be in advance as this is different depending on your insurance coverage. However, we may be able to find a substitute medication at lower cost or fill out paperwork to get insurance to cover a needed medication.   If a prior authorization is required to get your medication covered by your insurance company, please allow [place] 1-2 business days to complete this process.  Drug prices often vary depending on where the prescription is filled and some pharmacies may offer cheaper prices.  The website www.goodrx.com contains coupons for medications through different pharmacies. The prices here do not account for what the cost may be with help from insurance (it may be cheaper with your insurance), but the website can give you the price if you did not use any insurance.  - You can print the associated coupon and take it with your prescription to the pharmacy.  - You may also stop by our office during regular business hours and pick up a GoodRx coupon card.  - If you need your prescription sent electronically to a different pharmacy, notify our office through [facility] or by phone at [phone] option 4.     Si Usted Necesita Algo Despus de Su Visita  Tambin puede enviarnos un mensaje a travs de Pharmacist, community. Por lo general respondemos a los mensajes de MyChart en el transcurso de 1 a 2 das hbiles.  Para renovar recetas, por favor pida a su farmacia que se ponga en contacto con nuestra oficina. Harland Dingwall de fax es [place] [phone].  Si tiene un asunto urgente cuando la clnica est cerrada y que no puede esperar hasta el siguiente da hbil, puede llamar/localizar a su doctor(a) al nmero que aparece a continuacin.   Por favor, tenga en cuenta que aunque hacemos todo lo posible para estar disponibles para asuntos urgentes fuera del  horario de [place], no estamos disponibles las 24 horas del da, los 7 das de la [place].   Si tiene un problema urgente y no puede comunicarse con nosotros, puede optar por buscar atencin mdica  en el consultorio de su doctor(a), en una clnica privada, en un centro de atencin urgente o en una sala de emergencias.  Si tiene Engineering geologist, por favor llame inmediatamente al 911 o vaya a la sala de emergencias.  Nmeros de bper  - Dr. Nehemiah Massed: [phone]  - Dra. Moye: [phone]  - Dra. Nicole Kindred: [phone]  En caso de inclemencias del [place], por favor llame a Johnsie Kindred principal al [phone] para una actualizacin sobre el [place] de cualquier retraso o cierre.  Consejos para la medicacin en dermatologa: Por favor, guarde las cajas en las que vienen los medicamentos de uso tpico para ayudarle a seguir las instrucciones sobre dnde y cmo usarlos. Las farmacias generalmente imprimen las instrucciones del medicamento slo en las cajas y no directamente en los tubos  del medicamento.   Si su medicamento es muy caro, por favor, pngase en contacto con Zigmund Daniel llamando al [phone] y presione la opcin 4 o envenos un mensaje a travs de Pharmacist, community.   No podemos decirle cul ser su copago por los medicamentos por adelantado ya que esto es diferente dependiendo de la cobertura de su seguro. Sin embargo, es posible que podamos encontrar un medicamento sustituto a Electrical engineer un formulario para que el seguro cubra el medicamento que se considera necesario.   Si se requiere una autorizacin previa para que su compaa de seguros [place] su medicamento, por favor permtanos de 1 a 2 das hbiles para completar este proceso.  Los precios de los medicamentos varan con frecuencia dependiendo del Environmental consultant de dnde se surte la receta y alguna farmacias pueden ofrecer precios ms baratos.  El sitio web www.goodrx.com tiene cupones para medicamentos de Office manager. Los precios aqu no tienen en cuenta lo que podra costar con la ayuda del seguro (puede ser ms barato con su seguro), pero el sitio web puede darle el precio si no utiliz Research scientist (physical sciences).  - Puede imprimir el cupn correspondiente y llevarlo con su receta a la farmacia.  - Tambin puede pasar por nuestra oficina durante el horario de atencin regular y Charity fundraiser una tarjeta de cupones de GoodRx.  - Si necesita que su receta se enve electrnicamente a una farmacia diferente, informe a nuestra oficina a travs de MyChart de [place] o por telfono llamando al [phone] y presione la opcin 4.

## 2021-07-17 NOTE — Progress Notes (Signed)
Follow-Up Visit   Subjective  Aimee Lawson is a 61 y.o. female who presents for the following: Follow-up (Patient here today for concerns about 3 spot at forehead. Patient has had blue light therapy to face and chest. ). The patient has spots, moles and lesions to be evaluated, some may be new or changing and the patient has concerns that these could be cancer.  The following portions of the chart were reviewed this encounter and updated as appropriate:  Tobacco   Allergies   Meds   Problems   Med Hx   Surg Hx   Fam Hx      Review of Systems: No other skin or systemic complaints except as noted in HPI or Assessment and Plan.  Objective  Well appearing patient in no apparent distress; mood and affect are within normal limits.  A focused examination was performed including face. Relevant physical exam findings are noted in the Assessment and Plan.  left forehead x 1 Erythematous stuck-on, waxy papule or plaque  right eyebrow x 4, left eyebrow x 2 (6) Erythematous thin papules/macules with gritty scale.    Assessment & Plan  Inflamed seborrheic keratosis left forehead x 1 Irritated  Destruction of lesion - left forehead x 1 Complexity: simple   Destruction method: cryotherapy   Informed consent: discussed and consent obtained   Timeout:  patient name, date of birth, surgical site, and procedure verified Lesion destroyed using liquid nitrogen: Yes   Region frozen until ice ball extended beyond lesion: Yes   Outcome: patient tolerated procedure well with no complications   Post-procedure details: wound care instructions given   Additional details:  Prior to procedure, discussed risks of blister formation, small wound, skin dyspigmentation, or rare scar following cryotherapy. Recommend Vaseline ointment to treated areas while healing.  Actinic keratosis (6) right eyebrow x 4, left eyebrow x 2 Actinic keratoses are precancerous spots that appear secondary to cumulative UV  radiation exposure/sun exposure over time. They are chronic with expected duration over 1 year. A portion of actinic keratoses will progress to squamous cell carcinoma of the skin. It is not possible to reliably predict which spots will progress to skin cancer and so treatment is recommended to prevent development of skin cancer.  Recommend daily broad spectrum sunscreen SPF 30+ to sun-exposed areas, reapply every 2 hours as needed.  Recommend staying in the shade or wearing long sleeves, sun glasses (UVA+UVB protection) and wide brim hats (4-inch brim around the entire circumference of the hat). Call for new or changing lesions.  Destruction of lesion - right eyebrow x 4, left eyebrow x 2 Complexity: simple   Destruction method: cryotherapy   Informed consent: discussed and consent obtained   Timeout:  patient name, date of birth, surgical site, and procedure verified Lesion destroyed using liquid nitrogen: Yes   Region frozen until ice ball extended beyond lesion: Yes   Outcome: patient tolerated procedure well with no complications   Post-procedure details: wound care instructions given   Additional details:  Prior to procedure, discussed risks of blister formation, small wound, skin dyspigmentation, or rare scar following cryotherapy. Recommend Vaseline ointment to treated areas while healing.  Seborrheic Keratoses - Stuck-on, waxy, tan-brown papules and/or plaques  - Benign-appearing - Discussed benign etiology and prognosis. - Observe - Call for any changes  Actinic Damage - chronic, secondary to cumulative UV radiation exposure/sun exposure over time - diffuse scaly erythematous macules with underlying dyspigmentation - Recommend daily broad spectrum sunscreen SPF 30+  to sun-exposed areas, reapply every 2 hours as needed.  - Recommend staying in the shade or wearing long sleeves, sun glasses (UVA+UVB protection) and wide brim hats (4-inch brim around the entire circumference of the  hat). - Call for new or changing lesions.  Return for keep follow up as scheduled 3/23.  IRuthell Rummage, CMA, am acting as scribe for Sarina Ser, MD. Documentation: I have reviewed the above documentation for accuracy and completeness, and I agree with the above.  Sarina Ser, MD

## 2021-07-18 ENCOUNTER — Encounter: Payer: Self-pay | Admitting: Dermatology

## 2021-07-23 NOTE — Patient Instructions (Signed)

## 2021-07-25 ENCOUNTER — Ambulatory Visit: Payer: 59 | Admitting: Nurse Practitioner

## 2021-07-25 ENCOUNTER — Encounter: Payer: Self-pay | Admitting: Nurse Practitioner

## 2021-07-25 ENCOUNTER — Other Ambulatory Visit: Payer: Self-pay

## 2021-07-25 VITALS — BP 103/70 | HR 77 | Temp 98.1°F | Wt 177.0 lb

## 2021-07-25 DIAGNOSIS — J432 Centrilobular emphysema: Secondary | ICD-10-CM

## 2021-07-25 DIAGNOSIS — F17210 Nicotine dependence, cigarettes, uncomplicated: Secondary | ICD-10-CM

## 2021-07-25 DIAGNOSIS — I700 Atherosclerosis of aorta: Secondary | ICD-10-CM

## 2021-07-25 DIAGNOSIS — Z23 Encounter for immunization: Secondary | ICD-10-CM

## 2021-07-25 DIAGNOSIS — E782 Mixed hyperlipidemia: Secondary | ICD-10-CM | POA: Diagnosis not present

## 2021-07-25 DIAGNOSIS — I8393 Asymptomatic varicose veins of bilateral lower extremities: Secondary | ICD-10-CM | POA: Diagnosis not present

## 2021-07-25 DIAGNOSIS — R0989 Other specified symptoms and signs involving the circulatory and respiratory systems: Secondary | ICD-10-CM

## 2021-07-25 DIAGNOSIS — R69 Illness, unspecified: Secondary | ICD-10-CM | POA: Diagnosis not present

## 2021-07-25 DIAGNOSIS — E538 Deficiency of other specified B group vitamins: Secondary | ICD-10-CM | POA: Diagnosis not present

## 2021-07-25 MED ORDER — UMECLIDINIUM-VILANTEROL 62.5-25 MCG/ACT IN AEPB: 1.0000 | INHALATION_SPRAY | Freq: Every day | RESPIRATORY_TRACT | 4 refills | Status: DC

## 2021-07-25 NOTE — Assessment & Plan Note (Signed)
Noted on past labs -- takes supplement sporadically, recommend she take consistently.  Recheck level today.

## 2021-07-25 NOTE — Assessment & Plan Note (Signed)
<  50% stenosis on imaging.  Follow-up with vascular PRN or is symptomatic, plan to recheck imaging in 5-6 years.  Recommend complete cessation of smoking. 

## 2021-07-25 NOTE — Assessment & Plan Note (Signed)
Ongoing.  Noted on lung CT screening.  Last visit FEV1 66% and FEV1/FVC 83% in June 2021.  Continue Anoro at this time, may adjust due to cost -- to consistently use daily and then use Albuterol ONLY as needed for SOB or wheezing -- she rarely needs Albuterol with Anoro on board.  Educated her on emphysema and progressive nature, ways to slow down progression, including smoking cessation.  Return in 6 months.  Labs today.

## 2021-07-25 NOTE — Assessment & Plan Note (Signed)
Ongoing and stable.  Continue Atorvastatin 10 MG daily.  Check lipid panel and CMP today.  Recommend diet changes and modest loss.  

## 2021-07-25 NOTE — Assessment & Plan Note (Signed)
I have recommended complete cessation of tobacco use. I have discussed various options available for assistance with tobacco cessation including over the counter methods (Nicotine gum, patch and lozenges). We also discussed prescription options (Chantix, Nicotine Inhaler / Nasal Spray). The patient is not interested in pursuing any prescription tobacco cessation options at this time.  

## 2021-07-25 NOTE — Assessment & Plan Note (Signed)
Noted on lung CT screening.  Continue statin and ASA daily for prevention purposes.  Recommend complete cessation of smoking. 

## 2021-07-25 NOTE — Assessment & Plan Note (Signed)
At this time not inflamed, recommend she consistently wear compression hose during day, ESPECIALLY on long flights due to her risk factors.  Recommend she go to medical supply and get measured to ensure proper fit.  May take Tylenol as needed for pain.  If worsening or ongoing and no relief with compression or Tylenol then will send to vascular.

## 2021-07-25 NOTE — Progress Notes (Signed)
BP 103/70    Pulse 77    Temp 98.1 F (36.7 C)    Wt 177 lb (80.3 kg)    LMP 09/07/2010 (Approximate)    SpO2 98%    BMI 29.91 kg/m    Subjective:    Patient ID: Aimee Lawson, female    DOB: [birth year], 61 y.o.   MRN: [id number]  HPI: Aimee Lawson is a 61 y.o. female  Chief Complaint  Patient presents with   Hyperlipidemia   COPD   Edema    Patient states her legs swell when she is on her feet a lot and when she flies. Patient states they are not swollen today.    COPD Low dose lung screening noted centrilobular and paraseptal emphysema + aortic atherosclerosis.  Continues Anoro daily, uses Albuterol as needed.  She does smoke -- averages about 1 PPD, has smoked since she was 15.  Has tried various medications for cessation in past without benefit. COPD status: stable Satisfied with current treatment?: no Oxygen use: no Dyspnea frequency: none Cough frequency: occasional Rescue inhaler frequency:  none Limitation of activity: no Productive cough: none Last Spirometry: 01/01/20 FEV1 66% and FEV1/FVC 83% Pneumovax: Provided today Influenza: Up to Date    HYPERLIPIDEMIA Continues on Atorvastatin.  Does get some swelling and has varicose veins to legs when flying and sitting for long period.  Traveling to [place] in February.  B12 level was 291 in past, takes supplement occasionally. Hyperlipidemia status: good compliance Satisfied with current treatment?  yes Side effects:  no Medication compliance: good compliance Supplements: none Aspirin:  yes The 10-year ASCVD risk score (Arnett DK, et al., 2019) is: 4.7%   Values used to calculate the score:     Age: 32 years     Sex: Female     Is Non-Hispanic African American: No     Diabetic: No     Tobacco smoker: Yes     Systolic Blood Pressure: 409 mmHg     Is BP treated: No     HDL Cholesterol: 75 mg/dL     Total Cholesterol: 213 mg/dL Chest pain:  no Coronary artery disease:  no Family history CAD: yes Family  history early CAD:  yes  Relevant past medical, surgical, family and social history reviewed and updated as indicated. Interim medical history since our last visit reviewed. Allergies and medications reviewed and updated.  Review of Systems  Constitutional:  Negative for activity change, appetite change, diaphoresis, fatigue and fever.  Respiratory:  Negative for cough, chest tightness, shortness of breath and wheezing.   Cardiovascular:  Negative for chest pain, palpitations and leg swelling.  Gastrointestinal: Negative.   Neurological: Negative.   Psychiatric/Behavioral: Negative.     Per HPI unless specifically indicated above     Objective:    BP 103/70    Pulse 77    Temp 98.1 F (36.7 C)    Wt 177 lb (80.3 kg)    LMP 09/07/2010 (Approximate)    SpO2 98%    BMI 29.91 kg/m   Wt Readings from Last 3 Encounters:  07/25/21 177 lb (80.3 kg)  03/28/21 171 lb 12.8 oz (77.9 kg)  02/21/21 169 lb 5.4 oz (76.8 kg)    Physical Exam Vitals and nursing note reviewed.  Constitutional:      General: She is awake. She is not in acute distress.    Appearance: She is well-developed, well-groomed and overweight. She is not ill-appearing.  HENT:  Head: Normocephalic.     Right Ear: Hearing normal.     Left Ear: Hearing normal.  Eyes:     General: Lids are normal.        Right eye: No discharge.        Left eye: No discharge.     Conjunctiva/sclera: Conjunctivae normal.     Pupils: Pupils are equal, round, and reactive to light.  Neck:     Thyroid: No thyromegaly.     Vascular: Carotid bruit present.  Cardiovascular:     Rate and Rhythm: Normal rate and regular rhythm.     Heart sounds: Normal heart sounds. No murmur heard.   No gallop.     Comments: Varicose veins bilateral lower legs, no inflammation. Pulmonary:     Effort: Pulmonary effort is normal. No accessory muscle usage or respiratory distress.     Breath sounds: Normal breath sounds.  Abdominal:     General: Bowel  sounds are normal.     Palpations: Abdomen is soft. There is no hepatomegaly or splenomegaly.  Musculoskeletal:     Cervical back: Normal range of motion and neck supple.     Right lower leg: No edema.     Left lower leg: No edema.  Lymphadenopathy:     Cervical: No cervical adenopathy.  Skin:    General: Skin is warm and dry.  Neurological:     Mental Status: She is alert and oriented to person, place, and time.  Psychiatric:        Attention and Perception: Attention normal.        Mood and Affect: Mood normal.        Speech: Speech normal.        Behavior: Behavior normal. Behavior is cooperative.        Thought Content: Thought content normal.   Results for orders placed or performed in visit on 03/28/21  Comp Met (CMET)  Result Value Ref Range   Glucose 75 65 - 99 mg/dL   BUN 16 8 - 27 mg/dL   Creatinine, Ser 0.85 0.57 - 1.00 mg/dL   eGFR 77 >59 mL/min/1.73   BUN/Creatinine Ratio 19 12 - 28   Sodium 141 134 - 144 mmol/L   Potassium 3.8 3.5 - 5.2 mmol/L   Chloride 101 96 - 106 mmol/L   CO2 24 20 - 29 mmol/L   Calcium 9.4 8.7 - 10.3 mg/dL   Total Protein 6.8 6.0 - 8.5 g/dL   Albumin 4.5 3.8 - 4.8 g/dL   Globulin, Total 2.3 1.5 - 4.5 g/dL   Albumin/Globulin Ratio 2.0 1.2 - 2.2   Bilirubin Total 0.6 0.0 - 1.2 mg/dL   Alkaline Phosphatase 113 44 - 121 IU/L   AST 17 0 - 40 IU/L   ALT 14 0 - 32 IU/L  CBC w/Diff  Result Value Ref Range   WBC 7.0 3.4 - 10.8 x10E3/uL   RBC 4.03 3.77 - 5.28 x10E6/uL   Hemoglobin 12.6 11.1 - 15.9 g/dL   Hematocrit 37.0 34.0 - 46.6 %   MCV 92 79 - 97 fL   MCH 31.3 26.6 - 33.0 pg   MCHC 34.1 31.5 - 35.7 g/dL   RDW 12.1 11.7 - 15.4 %   Platelets 215 150 - 450 x10E3/uL   Neutrophils 51 Not Estab. %   Lymphs 40 Not Estab. %   Monocytes 7 Not Estab. %   Eos 1 Not Estab. %   Basos 1 Not Estab. %   Neutrophils  Absolute 3.6 1.4 - 7.0 x10E3/uL   Lymphocytes Absolute 2.8 0.7 - 3.1 x10E3/uL   Monocytes Absolute 0.5 0.1 - 0.9 x10E3/uL   EOS  (ABSOLUTE) 0.1 0.0 - 0.4 x10E3/uL   Basophils Absolute 0.0 0.0 - 0.2 x10E3/uL   Immature Granulocytes 0 Not Estab. %   Immature Grans (Abs) 0.0 0.0 - 0.1 x10E3/uL  Urinalysis, Routine w reflex microscopic  Result Value Ref Range   Specific Gravity, UA 1.010 1.005 - 1.030   pH, UA 5.5 5.0 - 7.5   Color, UA Yellow Yellow   Appearance Ur Clear Clear   Leukocytes,UA Negative Negative   Protein,UA Negative Negative/Trace   Glucose, UA Negative Negative   Ketones, UA Negative Negative   RBC, UA Negative Negative   Bilirubin, UA Negative Negative   Urobilinogen, Ur 0.2 0.2 - 1.0 mg/dL   Nitrite, UA Negative Negative  Amylase  Result Value Ref Range   Amylase 31 31 - 110 U/L  Lipase  Result Value Ref Range   Lipase 22 14 - 72 U/L      Assessment & Plan:   Problem List Items Addressed This Visit       Cardiovascular and Mediastinum   Aortic atherosclerosis ([place])    Noted on lung CT screening.  Continue statin and ASA daily for prevention purposes.  Recommend complete cessation of smoking.      Relevant Orders   Lipid Panel w/o Chol/HDL Ratio   Spider veins of both lower extremities    At this time not inflamed, recommend she consistently wear compression hose during day, ESPECIALLY on long flights due to her risk factors.  Recommend she go to medical supply and get measured to ensure proper fit.  May take Tylenol as needed for pain.  If worsening or ongoing and no relief with compression or Tylenol then will send to vascular.          Respiratory   Centrilobular emphysema ([place]) - Primary    Ongoing.  Noted on lung CT screening.  Last visit FEV1 66% and FEV1/FVC 83% in June 2021.  Continue Anoro at this time, may adjust due to cost -- to consistently use daily and then use Albuterol ONLY as needed for SOB or wheezing -- she rarely needs Albuterol with Anoro on board.  Educated her on emphysema and progressive nature, ways to slow down progression, including smoking cessation.   Return in 6 months.  Labs today.      Relevant Medications   umeclidinium-vilanterol (ANORO ELLIPTA) 62.5-25 MCG/ACT AEPB   Other Relevant Orders   CBC with Differential/Platelet     Other   B12 deficiency    Noted on past labs -- takes supplement sporadically, recommend she take consistently.  Recheck level today.      Relevant Orders   CBC with Differential/Platelet   Vitamin B12   Bilateral carotid bruits    <50% stenosis on imaging.  Follow-up with vascular PRN or is symptomatic, plan to recheck imaging in 5-6 years.  Recommend complete cessation of smoking.      Cigarette nicotine dependence without complication    I have recommended complete cessation of tobacco use. I have discussed various options available for assistance with tobacco cessation including over the counter methods (Nicotine gum, patch and lozenges). We also discussed prescription options (Chantix, Nicotine Inhaler / Nasal Spray). The patient is not interested in pursuing any prescription tobacco cessation options at this time.       Hyperlipemia    Ongoing and  stable.  Continue Atorvastatin 10 MG daily.  Check lipid panel and CMP today.  Recommend diet changes and modest loss.       Relevant Orders   Comprehensive metabolic panel   Lipid Panel w/o Chol/HDL Ratio   Other Visit Diagnoses     Pneumococcal vaccination given       PPSV23 provided today.   Relevant Orders   Pneumococcal polysaccharide vaccine 23-valent greater than or equal to 2yo subcutaneous/IM (Completed)   Need for shingles vaccine       Shingrix today.   Relevant Orders   Varicella-zoster vaccine IM (Shingrix) (Completed)        Follow up plan: Return in about 6 months (around 01/22/2022) for Annual physical.

## 2021-07-26 LAB — COMPREHENSIVE METABOLIC PANEL
ALT: 16 IU/L (ref 0–32)
AST: 17 IU/L (ref 0–40)
Albumin/Globulin Ratio: 2.4 — ABNORMAL HIGH (ref 1.2–2.2)
Albumin: 4.7 g/dL (ref 3.8–4.8)
Alkaline Phosphatase: 101 IU/L (ref 44–121)
BUN/Creatinine Ratio: 19 (ref 12–28)
BUN: 18 mg/dL (ref 8–27)
Bilirubin Total: 0.5 mg/dL (ref 0.0–1.2)
CO2: 24 mmol/L (ref 20–29)
Calcium: 9.1 mg/dL (ref 8.7–10.3)
Chloride: 107 mmol/L — ABNORMAL HIGH (ref 96–106)
Creatinine, Ser: 0.93 mg/dL (ref 0.57–1.00)
Globulin, Total: 2 g/dL (ref 1.5–4.5)
Glucose: 61 mg/dL — ABNORMAL LOW (ref 70–99)
Potassium: 4.4 mmol/L (ref 3.5–5.2)
Sodium: 143 mmol/L (ref 134–144)
Total Protein: 6.7 g/dL (ref 6.0–8.5)
eGFR: 69 mL/min/{1.73_m2} (ref >59)

## 2021-07-26 LAB — CBC WITH DIFFERENTIAL/PLATELET
Basophils Absolute: 0 10*3/uL (ref 0.0–0.2)
Basos: 1 %
EOS (ABSOLUTE): 0.1 10*3/uL (ref 0.0–0.4)
Eos: 2 %
Hematocrit: 38.7 % (ref 34.0–46.6)
Hemoglobin: 13.4 g/dL (ref 11.1–15.9)
Immature Grans (Abs): 0 10*3/uL (ref 0.0–0.1)
Immature Granulocytes: 0 %
Lymphocytes Absolute: 2.2 10*3/uL (ref 0.7–3.1)
Lymphs: 41 %
MCH: 31.8 pg (ref 26.6–33.0)
MCHC: 34.6 g/dL (ref 31.5–35.7)
MCV: 92 fL (ref 79–97)
Monocytes Absolute: 0.4 10*3/uL (ref 0.1–0.9)
Monocytes: 8 %
Neutrophils Absolute: 2.6 10*3/uL (ref 1.4–7.0)
Neutrophils: 48 %
Platelets: 225 10*3/uL (ref 150–450)
RBC: 4.22 x10E6/uL (ref 3.77–5.28)
RDW: 12.2 % (ref 11.7–15.4)
WBC: 5.5 10*3/uL (ref 3.4–10.8)

## 2021-07-26 LAB — LIPID PANEL W/O CHOL/HDL RATIO
Cholesterol, Total: 164 mg/dL (ref 100–199)
HDL: 68 mg/dL (ref >39)
LDL Chol Calc (NIH): 82 mg/dL (ref 0–99)
Triglycerides: 74 mg/dL (ref 0–149)
VLDL Cholesterol Cal: 14 mg/dL (ref 5–40)

## 2021-07-26 LAB — VITAMIN B12: Vitamin B-12: 289 pg/mL (ref 232–1245)

## 2021-07-26 NOTE — Progress Notes (Signed)
Contacted via [place] afternoon Sh[place]uye, your labs have returned and overall continue to look fabulous.  No changes needed!!  Have a wonderful day!!  Enjoy your travels. Keep being awesome!!  Thank you for allowing me to participate in your care.  I appreciate you. Kindest regards, Al Bracewell

## 2021-09-06 ENCOUNTER — Other Ambulatory Visit: Payer: Self-pay | Admitting: Nurse Practitioner

## 2021-09-06 ENCOUNTER — Other Ambulatory Visit: Payer: Self-pay

## 2021-09-06 ENCOUNTER — Ambulatory Visit
Admission: RE | Admit: 2021-09-06 | Discharge: 2021-09-06 | Disposition: A | Discharge status: Home / Self Care | Payer: 59 | Source: Ambulatory Visit | Attending: Nurse Practitioner | Admitting: Nurse Practitioner

## 2021-09-06 DIAGNOSIS — Z1231 Encounter for screening mammogram for malignant neoplasm of breast: Secondary | ICD-10-CM | POA: Diagnosis not present

## 2021-09-08 ENCOUNTER — Other Ambulatory Visit: Payer: Self-pay | Admitting: *Deleted

## 2021-09-08 ENCOUNTER — Inpatient Hospital Stay
Admission: RE | Admit: 2021-09-08 | Discharge: 2021-09-08 | Disposition: A | Discharge status: Home / Self Care | Payer: Self-pay | Source: Ambulatory Visit | Attending: *Deleted | Admitting: *Deleted

## 2021-09-08 DIAGNOSIS — Z1231 Encounter for screening mammogram for malignant neoplasm of breast: Secondary | ICD-10-CM

## 2021-09-11 NOTE — Progress Notes (Signed)
Contacted via MyChart   Normal mammogram, may repeat in one year:)

## 2021-09-13 DIAGNOSIS — Z872 Personal history of diseases of the skin and subcutaneous tissue: Secondary | ICD-10-CM | POA: Diagnosis not present

## 2021-09-13 DIAGNOSIS — L570 Actinic keratosis: Secondary | ICD-10-CM | POA: Diagnosis not present

## 2021-09-13 DIAGNOSIS — L578 Other skin changes due to chronic exposure to nonionizing radiation: Secondary | ICD-10-CM | POA: Diagnosis not present

## 2021-09-13 DIAGNOSIS — Z09 Encounter for follow-up examination after completed treatment for conditions other than malignant neoplasm: Secondary | ICD-10-CM | POA: Diagnosis not present

## 2021-09-13 DIAGNOSIS — Z859 Personal history of malignant neoplasm, unspecified: Secondary | ICD-10-CM | POA: Diagnosis not present

## 2021-09-13 DIAGNOSIS — D225 Melanocytic nevi of trunk: Secondary | ICD-10-CM | POA: Diagnosis not present

## 2021-09-13 DIAGNOSIS — Z86018 Personal history of other benign neoplasm: Secondary | ICD-10-CM | POA: Diagnosis not present

## 2021-09-28 ENCOUNTER — Ambulatory Visit: Payer: 59 | Admitting: Dermatology

## 2021-11-22 ENCOUNTER — Encounter: Payer: Self-pay | Admitting: Nurse Practitioner

## 2021-11-22 ENCOUNTER — Ambulatory Visit: Payer: 59 | Admitting: Nurse Practitioner

## 2021-11-22 DIAGNOSIS — J441 Chronic obstructive pulmonary disease with (acute) exacerbation: Secondary | ICD-10-CM

## 2021-11-22 MED ORDER — AZITHROMYCIN 250 MG PO TABS: ORAL_TABLET | ORAL | 0 refills | Status: DC

## 2021-11-22 MED ORDER — AZITHROMYCIN 250 MG PO TABS: ORAL_TABLET | ORAL | 0 refills | Status: AC

## 2021-11-22 MED ORDER — PREDNISONE 20 MG PO TABS: 40.0000 mg | ORAL_TABLET | Freq: Every day | ORAL | 0 refills | Status: DC

## 2021-11-22 MED ORDER — HYDROCOD POLI-CHLORPHE POLI ER 10-8 MG/5ML PO SUER: 5.0000 mL | Freq: Two times a day (BID) | ORAL | 0 refills | Status: DC | PRN

## 2021-11-22 MED ORDER — PREDNISONE 20 MG PO TABS
40.0000 mg | ORAL_TABLET | Freq: Every day | ORAL | 0 refills | Status: AC
Start: 2021-11-22 — End: 2021-11-27

## 2021-11-22 MED ORDER — ALBUTEROL SULFATE HFA 108 (90 BASE) MCG/ACT IN AERS
2.0000 | INHALATION_SPRAY | Freq: Four times a day (QID) | RESPIRATORY_TRACT | 4 refills | Status: DC | PRN
Start: 2021-11-22 — End: 2023-02-20

## 2021-11-22 NOTE — Patient Instructions (Signed)

## 2021-11-22 NOTE — Assessment & Plan Note (Signed)
Chronic with acute exacerbation for 9 days.  At this time will start Zpack and Prednisone + script for Tussionex.  Recommend continue inhalers as ordered and if ongoing cough or worsening return to office. ?

## 2021-11-22 NOTE — Progress Notes (Signed)
? ?BP 114/71   Pulse 80   Temp 97.9 ?F (36.6 ?C) (Oral)   Ht 5' 4.5" (1.638 m)   Wt 179 lb 12.8 oz (81.6 kg)   LMP 09/07/2010 (Approximate)   SpO2 94%   BMI 30.39 kg/m?   ? ?Subjective:  ? ? Patient ID: Aimee Lawson, female    DOB: 12/13/1960, 61 y.o.   MRN: [id number] ? ?HPI: ?Aimee Lawson is a 61 y.o. female ? ?Chief Complaint  ?Patient presents with  ? Cough  ?  Patient states she thinks her Allergies have moved down into her chest and caused her to have a cough. Patient states she has had the cough for 9 days. Patient states she tried Albuterol inhaler and nebulizer, Anoro inhaler, Mucinex DM and Zyrtec. Patient states the cough is worse in the morning than late at night. Patient says it is a dry and hacking cough. Patient is requesting a steroid or something stronger to help with cough.   ? ?UPPER RESPIRATORY TRACT INFECTION ?Has had chest tightness and cough for 9 days.  They have tested for Covid and it was negative.  Has been using Albuterol and nebulizer.  Cough worse in morning and late at night -- dry and hacking. ?Worst symptom: cough ?Fever: no ?Cough: yes ?Shortness of breath: no ?Wheezing: yes ?Chest pain: no ?Chest tightness: yes ?Chest congestion: yes ?Nasal congestion: no ?Runny nose: yes ?Post nasal drip: yes ?Sneezing: no ?Sore throat: no ?Swollen glands: no ?Sinus pressure: no ?Headache: no ?Face pain: no ?Toothache: no ?Ear pain: none ?Ear pressure: none ?Eyes red/itching:no ?Eye drainage/crusting: no  ?Vomiting: no ?Rash: no ?Fatigue: no ?Sick contacts: no ?Strep contacts: no  ?Context: fluctuating ?Recurrent sinusitis: no ?Relief with OTC cold/cough medications: no  ?Treatments attempted: Mucinex DM, Zyrtec, Albuterol   ? ?Relevant past medical, surgical, family and social history reviewed and updated as indicated. Interim medical history since our last visit reviewed. ?Allergies and medications reviewed and updated. ? ?Review of Systems  ?Constitutional:  Negative for activity  change, appetite change, chills, fatigue and fever.  ?HENT:  Positive for postnasal drip and rhinorrhea. Negative for congestion, ear discharge, ear pain, facial swelling, sinus pressure, sinus pain, sneezing, sore throat and voice change.   ?Respiratory:  Positive for cough, chest tightness and wheezing. Negative for shortness of breath.   ?Cardiovascular:  Negative for chest pain, palpitations and leg swelling.  ?Gastrointestinal: Negative.   ?Neurological: Negative.   ?Psychiatric/Behavioral: Negative.    ? ?Per HPI unless specifically indicated above ? ?   ?Objective:  ?  ?BP 114/71   Pulse 80   Temp 97.9 ?F (36.6 ?C) (Oral)   Ht 5' 4.5" (1.638 m)   Wt 179 lb 12.8 oz (81.6 kg)   LMP 09/07/2010 (Approximate)   SpO2 94%   BMI 30.39 kg/m?   ?Wt Readings from Last 3 Encounters:  ?11/22/21 179 lb 12.8 oz (81.6 kg)  ?07/25/21 177 lb (80.3 kg)  ?03/28/21 171 lb 12.8 oz (77.9 kg)  ?  ?Physical Exam ?Vitals and nursing note reviewed.  ?Constitutional:   ?   General: She is awake. She is not in acute distress. ?   Appearance: She is well-developed and well-groomed. She is not ill-appearing or toxic-appearing.  ?HENT:  ?   Head: Normocephalic.  ?   Right Ear: Hearing normal.  ?   Left Ear: Hearing normal.  ?Eyes:  ?   General: Lids are normal.     ?   Right eye:  No discharge.     ?   Left eye: No discharge.  ?   Conjunctiva/sclera: Conjunctivae normal.  ?   Pupils: Pupils are equal, round, and reactive to light.  ?Neck:  ?   Thyroid: No thyromegaly.  ?   Vascular: No carotid bruit.  ?Cardiovascular:  ?   Rate and Rhythm: Normal rate and regular rhythm.  ?   Heart sounds: Normal heart sounds. No murmur heard. ?  No gallop.  ?Pulmonary:  ?   Effort: Pulmonary effort is normal. No accessory muscle usage or respiratory distress.  ?   Breath sounds: Wheezing present.  ?   Comments: Wheezes noticed throughout.   ?Abdominal:  ?   General: Bowel sounds are normal.  ?   Palpations: Abdomen is soft.  ?Musculoskeletal:  ?    Cervical back: Normal range of motion and neck supple.  ?   Right lower leg: No edema.  ?   Left lower leg: No edema.  ?Skin: ?   General: Skin is warm and dry.  ?Neurological:  ?   Mental Status: She is alert and oriented to person, place, and time.  ?Psychiatric:     ?   Attention and Perception: Attention normal.     ?   Mood and Affect: Mood normal.     ?   Speech: Speech normal.     ?   Behavior: Behavior normal. Behavior is cooperative.     ?   Thought Content: Thought content normal.  ? ?Results for orders placed or performed in visit on 07/25/21  ?CBC with Differential/Platelet  ?Result Value Ref Range  ? WBC 5.5 3.4 - 10.8 x10E3/uL  ? RBC 4.22 3.77 - 5.28 x10E6/uL  ? Hemoglobin 13.4 11.1 - 15.9 g/dL  ? Hematocrit 38.7 34.0 - 46.6 %  ? MCV 92 79 - 97 fL  ? MCH 31.8 26.6 - 33.0 pg  ? MCHC 34.6 31.5 - 35.7 g/dL  ? RDW 12.2 11.7 - 15.4 %  ? Platelets 225 150 - 450 x10E3/uL  ? Neutrophils 48 Not Estab. %  ? Lymphs 41 Not Estab. %  ? Monocytes 8 Not Estab. %  ? Eos 2 Not Estab. %  ? Basos 1 Not Estab. %  ? Neutrophils Absolute 2.6 1.4 - 7.0 x10E3/uL  ? Lymphocytes Absolute 2.2 0.7 - 3.1 x10E3/uL  ? Monocytes Absolute 0.4 0.1 - 0.9 x10E3/uL  ? EOS (ABSOLUTE) 0.1 0.0 - 0.4 x10E3/uL  ? Basophils Absolute 0.0 0.0 - 0.2 x10E3/uL  ? Immature Granulocytes 0 Not Estab. %  ? Immature Grans (Abs) 0.0 0.0 - 0.1 x10E3/uL  ?Comprehensive metabolic panel  ?Result Value Ref Range  ? Glucose 61 (L) 70 - 99 mg/dL  ? BUN 18 8 - 27 mg/dL  ? Creatinine, Ser 0.93 0.57 - 1.00 mg/dL  ? eGFR 69 >59 mL/min/1.73  ? BUN/Creatinine Ratio 19 12 - 28  ? Sodium 143 134 - 144 mmol/L  ? Potassium 4.4 3.5 - 5.2 mmol/L  ? Chloride 107 (H) 96 - 106 mmol/L  ? CO2 24 20 - 29 mmol/L  ? Calcium 9.1 8.7 - 10.3 mg/dL  ? Total Protein 6.7 6.0 - 8.5 g/dL  ? Albumin 4.7 3.8 - 4.8 g/dL  ? Globulin, Total 2.0 1.5 - 4.5 g/dL  ? Albumin/Globulin Ratio 2.4 (H) 1.2 - 2.2  ? Bilirubin Total 0.5 0.0 - 1.2 mg/dL  ? Alkaline Phosphatase 101 44 - 121 IU/L  ? AST 17 0  - 40  IU/L  ? ALT 16 0 - 32 IU/L  ?Vitamin B12  ?Result Value Ref Range  ? Vitamin B-12 289 232 - 1,245 pg/mL  ?Lipid Panel w/o Chol/HDL Ratio  ?Result Value Ref Range  ? Cholesterol, Total 164 100 - 199 mg/dL  ? Triglycerides 74 0 - 149 mg/dL  ? HDL 68 >39 mg/dL  ? VLDL Cholesterol Cal 14 5 - 40 mg/dL  ? LDL Chol Calc (NIH) 82 0 - 99 mg/dL  ? ?   ?Assessment & Plan:  ? ?Problem List Items Addressed This Visit   ? ?  ? Respiratory  ? COPD exacerbation ([place])  ?  Chronic with acute exacerbation for 9 days.  At this time will start Zpack and Prednisone + script for Tussionex.  Recommend continue inhalers as ordered and if ongoing cough or worsening return to office. ? ?  ?  ? Relevant Medications  ? albuterol (VENTOLIN HFA) 108 (90 Base) MCG/ACT inhaler  ? azithromycin (ZITHROMAX) 250 MG tablet  ? predniSONE (DELTASONE) 20 MG tablet  ? chlorpheniramine-HYDROcodone (TUSSIONEX PENNKINETIC ER) 10-8 MG/5ML  ?  ? ?Follow up plan: ?Return if symptoms worsen or fail to improve. ? ? ? ? ? ?

## 2021-11-30 ENCOUNTER — Telehealth: Payer: Self-pay | Admitting: Nurse Practitioner

## 2021-11-30 MED ORDER — NYSTATIN 100000 UNIT/ML MT SUSP: 5.0000 mL | Freq: Four times a day (QID) | OROMUCOSAL | 0 refills | Status: DC

## 2021-11-30 NOTE — Telephone Encounter (Signed)
Patient has verbalized understanding.  

## 2021-11-30 NOTE — Telephone Encounter (Signed)
Copied from [place] [id number]. Topic: General - Other >> Nov 30, 2021 12:42 PM Yvette Rack wrote: Reason for CRM: Pt stated her inhaler has caused thrush and she would like to ask Jolene to prescribe her a medication and send it to [id number] [place] [place] Phone: [phone]: [phone]

## 2021-11-30 NOTE — Telephone Encounter (Signed)
Please advise, would patient need appointment?

## 2022-01-20 DIAGNOSIS — J432 Centrilobular emphysema: Secondary | ICD-10-CM | POA: Insufficient documentation

## 2022-01-20 NOTE — Patient Instructions (Signed)

## 2022-01-22 ENCOUNTER — Ambulatory Visit (INDEPENDENT_AMBULATORY_CARE_PROVIDER_SITE_OTHER): Payer: 59 | Admitting: Nurse Practitioner

## 2022-01-22 ENCOUNTER — Encounter: Payer: Self-pay | Admitting: Nurse Practitioner

## 2022-01-22 ENCOUNTER — Other Ambulatory Visit: Payer: Self-pay | Admitting: Nurse Practitioner

## 2022-01-22 VITALS — BP 112/70 | HR 81 | Temp 98.0°F | Ht 64.75 in | Wt 177.4 lb

## 2022-01-22 DIAGNOSIS — E782 Mixed hyperlipidemia: Secondary | ICD-10-CM

## 2022-01-22 DIAGNOSIS — F17210 Nicotine dependence, cigarettes, uncomplicated: Secondary | ICD-10-CM

## 2022-01-22 DIAGNOSIS — Z7184 Encounter for health counseling related to travel: Secondary | ICD-10-CM

## 2022-01-22 DIAGNOSIS — E538 Deficiency of other specified B group vitamins: Secondary | ICD-10-CM

## 2022-01-22 DIAGNOSIS — J432 Centrilobular emphysema: Secondary | ICD-10-CM | POA: Diagnosis not present

## 2022-01-22 DIAGNOSIS — R0989 Other specified symptoms and signs involving the circulatory and respiratory systems: Secondary | ICD-10-CM | POA: Diagnosis not present

## 2022-01-22 DIAGNOSIS — Z0000 Encounter for general adult medical examination without abnormal findings: Secondary | ICD-10-CM

## 2022-01-22 DIAGNOSIS — I8393 Asymptomatic varicose veins of bilateral lower extremities: Secondary | ICD-10-CM | POA: Diagnosis not present

## 2022-01-22 DIAGNOSIS — I700 Atherosclerosis of aorta: Secondary | ICD-10-CM | POA: Diagnosis not present

## 2022-01-22 DIAGNOSIS — R69 Illness, unspecified: Secondary | ICD-10-CM | POA: Diagnosis not present

## 2022-01-22 MED ORDER — AZITHROMYCIN 250 MG PO TABS: ORAL_TABLET | ORAL | 0 refills | Status: AC

## 2022-01-22 MED ORDER — PREDNISONE 20 MG PO TABS: 40.0000 mg | ORAL_TABLET | Freq: Every day | ORAL | 0 refills | Status: AC

## 2022-01-22 MED ORDER — CHANTIX STARTING MONTH PAK 0.5 MG X 11 & 1 MG X 42 PO TBPK: ORAL_TABLET | ORAL | 0 refills | Status: DC

## 2022-01-22 MED ORDER — ATORVASTATIN CALCIUM 10 MG PO TABS
10.0000 mg | ORAL_TABLET | Freq: Every day | ORAL | 4 refills | Status: DC
Start: 2022-01-22 — End: 2022-01-23

## 2022-01-22 MED ORDER — VARENICLINE TARTRATE 1 MG PO TABS: 1.0000 mg | ORAL_TABLET | Freq: Two times a day (BID) | ORAL | 4 refills | Status: DC

## 2022-01-22 MED ORDER — CICLOPIROX 8 % EX SOLN
Freq: Every day | CUTANEOUS | 0 refills | Status: DC
Start: 2022-01-22 — End: 2022-12-12

## 2022-01-22 NOTE — Assessment & Plan Note (Signed)
<  50% stenosis on previous imaging. Follow up with vascular as needed. Recommend complete cessation of smoking, Chantix ordered per patient request to help with this.

## 2022-01-22 NOTE — Assessment & Plan Note (Signed)
Noted on lung screening. Continue Anoro and Albuterol and adjust as needed.

## 2022-01-22 NOTE — Assessment & Plan Note (Signed)
Noted on lung CT screening.  Continue statin and ASA daily for prevention purposes.  Recommend complete cessation of smoking.

## 2022-01-22 NOTE — Assessment & Plan Note (Signed)
Chronic, ongoing. She is experiencing swelling in her legs. She has tried compression hose without benefit. Referral to vascular.

## 2022-01-22 NOTE — Assessment & Plan Note (Signed)
Noted on past labs. Recommend she take B12 supplement consistently. Recheck level today.

## 2022-01-22 NOTE — Assessment & Plan Note (Signed)
Recommend complete cessation of tobacco use. She is requesting Chantix at this time. Order sent for this.

## 2022-01-22 NOTE — Assessment & Plan Note (Signed)
Chronic, ongoing. Continue Atorvastatin daily and adjust as needed. Check lipid panel and CMP today. Recommend diet focus and modest weight loss.

## 2022-01-22 NOTE — Progress Notes (Deleted)
LMP 09/07/2010 (Approximate)    Subjective:    Patient ID: Aimee Lawson, female    DOB: 01-Feb-1961, 61 y.o.   MRN: [id number]  HPI: Aimee Lawson is a 61 y.o. female presenting on 01/22/2022 for comprehensive medical examination. Current medical complaints include:{Blank single:19197::"none","***"}  She currently lives with: Menopausal Symptoms: {Blank single:19197::"yes","no"}  COPD Currently treated with Anoro daily and Albuterol.  Attends lung screening with last done 12/26/20, noting centrilobular emphysema and aortic atherosclerosis. COPD status: {Blank single:19197::"controlled","uncontrolled","better","worse","exacerbated","stable"} Satisfied with current treatment?: {Blank single:19197::"yes","no"} Oxygen use: {Blank single:19197::"yes","no"} Dyspnea frequency:  Cough frequency:  Rescue inhaler frequency:   Limitation of activity: {Blank single:19197::"yes","no"} Productive cough:  Last Spirometry:  Pneumovax: {Blank single:19197::"Up to Date","Not up to Date","unknown"} Influenza: {Blank single:19197::"Up to Date","Not up to Date","unknown"}  HYPERLIPIDEMIA Continues on Atorvastatin daily.   Saw Dr. Delana Meyer in 2021 for bilateral bruits noted.  Imaging showed <50% stenosis, comparable to previous studies -- no intervention. Hyperlipidemia status: {Blank single:19197::"excellent compliance","good compliance","fair compliance","poor compliance"} Satisfied with current treatment?  {Blank single:19197::"yes","no"} Side effects:  {Blank single:19197::"yes","no"} Medication compliance: {Blank single:19197::"excellent compliance","good compliance","fair compliance","poor compliance"} Supplements: {Blank multiple:19196::"none","fish oil","niacin","red yeast rice"} Aspirin:  {Blank single:19197::"yes","no"} The 10-year ASCVD risk score (Arnett DK, et al., 2019) is: 5.5%   Values used to calculate the score:     Age: 23 years     Sex: Female     Is Non-Hispanic African  American: No     Diabetic: No     Tobacco smoker: Yes     Systolic Blood Pressure: 790 mmHg     Is BP treated: No     HDL Cholesterol: 68 mg/dL     Total Cholesterol: 164 mg/dL Chest pain:  {Blank single:19197::"yes","no"} Coronary artery disease:  {Blank single:19197::"yes","no"} Family history CAD:  {Blank single:19197::"yes","no"} Family history early CAD:  {Blank single:19197::"yes","no"}    Depression Screen done today and results listed below:     11/22/2021    2:32 PM 10/04/2020   10:52 AM 09/29/2019    1:15 PM  Depression screen PHQ 2/9  Decreased Interest 0 0 0  Down, Depressed, Hopeless 0 0 0  PHQ - 2 Score 0 0 0  Altered sleeping 0 0   Tired, decreased energy 0 1   Change in appetite 0 0   Feeling bad or failure about yourself  0 0   Trouble concentrating 0 0   Moving slowly or fidgety/restless 0 0   Suicidal thoughts 0 0   PHQ-9 Score 0 1     The patient {has/does not WIOX:73532} a history of falls. I {did/did not:19850} complete a risk assessment for falls. A plan of care for falls {was/was not:19852} documented.   Past Medical History:  Past Medical History:  Diagnosis Date   Atypical mole 11/04/2013   Right superior lateral pubic   No pertinent past medical history    Squamous cell carcinoma of skin 10/31/2017   right pretibial     Surgical History:  Past Surgical History:  Procedure Laterality Date   AUGMENTATION MAMMAPLASTY     BREAST ENHANCEMENT SURGERY     20+ yrs ago   BREAST SURGERY     COLONOSCOPY N/A 02/21/2021   Procedure: COLONOSCOPY;  Surgeon: Jonathon Bellows, MD;  Location[facility] ENDOSCOPY;  Service: Gastroenterology;  Laterality: N/A;   FACIAL COSMETIC SURGERY      Medications:  Current Outpatient Medications on File Prior to Visit  Medication Sig   albuterol (VENTOLIN HFA) 108 (90 Base) MCG/ACT inhaler Inhale 2 puffs  into the lungs every 6 (six) hours as needed for wheezing or shortness of breath.   atorvastatin (LIPITOR) 10 MG tablet  Take 1 tablet (10 mg total) by mouth daily.   umeclidinium-vilanterol (ANORO ELLIPTA) 62.5-25 MCG/ACT AEPB Inhale 1 puff into the lungs daily.   No current facility-administered medications on file prior to visit.    Allergies:  Allergies  Allergen Reactions   Penicillins Anaphylaxis    Social History:  Social History   Socioeconomic History   Marital status: Single    Spouse name: Not on file   Number of children: Not on file   Years of education: Not on file   Highest education level: Not on file  Occupational History   Not on file  Tobacco Use   Smoking status: Every Day    Packs/day: 1.00    Years: 44.00    Total pack years: 44.00    Types: Cigarettes    Last attempt to quit: 03/13/2020    Years since quitting: 1.8   Smokeless tobacco: Never  Vaping Use   Vaping Use: Never used  Substance and Sexual Activity   Alcohol use: Yes    Comment: socially   Drug use: Never   Sexual activity: Not Currently  Other Topics Concern   Not on file  Social History Narrative   Not on file   Social Determinants of Health   Financial Resource Strain: Low Risk  (09/29/2019)   Overall Financial Resource Strain (CARDIA)    Difficulty of Paying Living Expenses: Not hard at all  Food Insecurity: No Food Insecurity (09/29/2019)   Hunger Vital Sign    Worried About Running Out of Food in the Last Year: Never true    [place] in the Last Year: Never true  Transportation Needs: No Transportation Needs (09/29/2019)   PRAPARE - Hydrologist (Medical): No    Lack of Transportation (Non-Medical): No  Physical Activity: Sufficiently Active (09/29/2019)   Exercise Vital Sign    Days of Exercise per Week: 5 days    Minutes of Exercise per Session: 30 min  Stress: No Stress Concern Present (09/29/2019)   [place]    Feeling of Stress : Not at all  Social Connections: Unknown (09/29/2019)    Social Connection and Isolation Panel [NHANES]    Frequency of Communication with Friends and Family: Three times a week    Frequency of Social Gatherings with Friends and Family: Three times a week    Attends Religious Services: Never    Active Member of Clubs or Organizations: No    Attends Music therapist: Never    Marital Status: Not on file  Intimate Partner Violence: Not on file   Social History   Tobacco Use  Smoking Status Every Day   Packs/day: 1.00   Years: 44.00   Total pack years: 44.00   Types: Cigarettes   Last attempt to quit: 03/13/2020   Years since quitting: 1.8  Smokeless Tobacco Never   Social History   Substance and Sexual Activity  Alcohol Use Yes   Comment: socially    Family History:  Family History  Problem Relation Age of Onset   Heart attack Mother    Stroke Mother    Uterine cancer Mother    Colon cancer Mother    Heart attack Father    Hypertension Brother    Other Brother  Pacemaker   Emphysema Brother    Diabetes Maternal Grandmother    Heart disease Maternal Grandmother    Heart disease Maternal Grandfather    Heart disease Paternal Grandmother    Heart disease Paternal Grandfather     Past medical history, surgical history, medications, allergies, family history and social history reviewed with patient today and changes made to appropriate areas of the chart.   ROS All other ROS negative except what is listed above and in the HPI.      Objective:    LMP 09/07/2010 (Approximate)   Wt Readings from Last 3 Encounters:  11/22/21 179 lb 12.8 oz (81.6 kg)  07/25/21 177 lb (80.3 kg)  03/28/21 171 lb 12.8 oz (77.9 kg)    Physical Exam  Results for orders placed or performed in visit on 07/25/21  CBC with Differential/Platelet  Result Value Ref Range   WBC 5.5 3.4 - 10.8 x10E3/uL   RBC 4.22 3.77 - 5.28 x10E6/uL   Hemoglobin 13.4 11.1 - 15.9 g/dL   Hematocrit 38.7 34.0 - 46.6 %   MCV 92 79 - 97 fL   MCH  31.8 26.6 - 33.0 pg   MCHC 34.6 31.5 - 35.7 g/dL   RDW 12.2 11.7 - 15.4 %   Platelets 225 150 - 450 x10E3/uL   Neutrophils 48 Not Estab. %   Lymphs 41 Not Estab. %   Monocytes 8 Not Estab. %   Eos 2 Not Estab. %   Basos 1 Not Estab. %   Neutrophils Absolute 2.6 1.4 - 7.0 x10E3/uL   Lymphocytes Absolute 2.2 0.7 - 3.1 x10E3/uL   Monocytes Absolute 0.4 0.1 - 0.9 x10E3/uL   EOS (ABSOLUTE) 0.1 0.0 - 0.4 x10E3/uL   Basophils Absolute 0.0 0.0 - 0.2 x10E3/uL   Immature Granulocytes 0 Not Estab. %   Immature Grans (Abs) 0.0 0.0 - 0.1 x10E3/uL  Comprehensive metabolic panel  Result Value Ref Range   Glucose 61 (L) 70 - 99 mg/dL   BUN 18 8 - 27 mg/dL   Creatinine, Ser 0.93 0.57 - 1.00 mg/dL   eGFR 69 >59 mL/min/1.73   BUN/Creatinine Ratio 19 12 - 28   Sodium 143 134 - 144 mmol/L   Potassium 4.4 3.5 - 5.2 mmol/L   Chloride 107 (H) 96 - 106 mmol/L   CO2 24 20 - 29 mmol/L   Calcium 9.1 8.7 - 10.3 mg/dL   Total Protein 6.7 6.0 - 8.5 g/dL   Albumin 4.7 3.8 - 4.8 g/dL   Globulin, Total 2.0 1.5 - 4.5 g/dL   Albumin/Globulin Ratio 2.4 (H) 1.2 - 2.2   Bilirubin Total 0.5 0.0 - 1.2 mg/dL   Alkaline Phosphatase 101 44 - 121 IU/L   AST 17 0 - 40 IU/L   ALT 16 0 - 32 IU/L  Vitamin B12  Result Value Ref Range   Vitamin B-12 289 232 - 1,245 pg/mL  Lipid Panel w/o Chol/HDL Ratio  Result Value Ref Range   Cholesterol, Total 164 100 - 199 mg/dL   Triglycerides 74 0 - 149 mg/dL   HDL 68 >39 mg/dL   VLDL Cholesterol Cal 14 5 - 40 mg/dL   LDL Chol Calc (NIH) 82 0 - 99 mg/dL      Assessment & Plan:   Problem List Items Addressed This Visit       Cardiovascular and Mediastinum   Aortic atherosclerosis (HCC)   Relevant Orders   Comprehensive metabolic panel   Lipid Panel w/o Chol/HDL Ratio  Respiratory   Centrilobular emphysema ([place]) - Primary   Relevant Orders   CBC with Differential/Platelet     Other   B12 deficiency   Relevant Orders   CBC with Differential/Platelet   Vitamin  B12   Bilateral carotid bruits   Cigarette nicotine dependence without complication   Hyperlipemia   Relevant Orders   Comprehensive metabolic panel   Lipid Panel w/o Chol/HDL Ratio   Other Visit Diagnoses     Encounter for annual physical exam       Annual physical today with labs and health maintenance reviewed, discussed with patient.   Relevant Orders   TSH        Follow up plan: No follow-ups on file.   LABORATORY TESTING:  - Pap smear: {Blank ZDGUYQ:03474::"QVZ done","not applicable","up to date","done elsewhere"}  IMMUNIZATIONS:   - Tdap: Tetanus vaccination status reviewed: {tetanus status:315746}. - Influenza: {Blank single:19197::"Up to date","Administered today","Postponed to flu season","Refused","Given elsewhere"} - Pneumovax: {Blank single:19197::"Up to date","Administered today","Not applicable","Refused","Given elsewhere"} - Prevnar: {Blank single:19197::"Up to date","Administered today","Not applicable","Refused","Given elsewhere"} - COVID: {Blank single:19197::"Up to date","Administered today","Not applicable","Refused","Given elsewhere"} - HPV: {Blank single:19197::"Up to date","Administered today","Not applicable","Refused","Given elsewhere"} - Shingrix vaccine: {Blank single:19197::"Up to date","Administered today","Not applicable","Refused","Given elsewhere"}  SCREENING: -Mammogram: {Blank single:19197::"Up to date","Ordered today","Not applicable","Refused","Done elsewhere"}  - Colonoscopy: {Blank single:19197::"Up to date","Ordered today","Not applicable","Refused","Done elsewhere"}  - Bone Density: {Blank single:19197::"Up to date","Ordered today","Not applicable","Refused","Done elsewhere"}  -Hearing Test: {Blank single:19197::"Up to date","Ordered today","Not applicable","Refused","Done elsewhere"}  -Spirometry: {Blank single:19197::"Up to date","Ordered today","Not applicable","Refused","Done elsewhere"}   PATIENT COUNSELING:   Advised to take 1  mg of folate supplement per day if capable of pregnancy.   Sexuality: Discussed sexually transmitted diseases, partner selection, use of condoms, avoidance of unintended pregnancy  and contraceptive alternatives.   Advised to avoid cigarette smoking.  I discussed with the patient that most people either abstain from alcohol or drink within safe limits (<=14/week and <=4 drinks/occasion for males, <=7/weeks and <= 3 drinks/occasion for females) and that the risk for alcohol disorders and other health effects rises proportionally with the number of drinks per week and how often a drinker exceeds daily limits.  Discussed cessation/primary prevention of drug use and availability of treatment for abuse.   Diet: Encouraged to adjust caloric intake to maintain  or achieve ideal body weight, to reduce intake of dietary saturated fat and total fat, to limit sodium intake by avoiding high sodium foods and not adding table salt, and to maintain adequate dietary potassium and calcium preferably from fresh fruits, vegetables, and low-fat dairy products.    Stressed the importance of regular exercise  Injury prevention: Discussed safety belts, safety helmets, smoke detector, smoking near bedding or upholstery.   Dental health: Discussed importance of regular tooth brushing, flossing, and dental visits.    NEXT PREVENTATIVE PHYSICAL DUE IN 1 YEAR. No follow-ups on file.

## 2022-01-22 NOTE — Progress Notes (Deleted)
BP 112/70   Pulse 81   Temp 98 F (36.7 C) (Oral)   Ht 5' 4.75" (1.645 m)   Wt 177 lb 6.4 oz (80.5 kg)   LMP 09/07/2010 (Approximate)   SpO2 94%   BMI 29.75 kg/m    Subjective:    Patient ID: Aimee Lawson, female    DOB: Aug 09, 1960, 61 y.o.   MRN: [id number]  HPI: Aimee Lawson is a 61 y.o. female presenting on 01/22/2022 for comprehensive medical examination. Current medical complaints include:{Blank single:19197::"none","***"}  She currently lives with: Menopausal Symptoms: {Blank single:19197::"yes","no"}  COPD Continues Anoro and Albuterol.  Goes for lung screening, last June 2022 with known aortic atherosclerosis and centrilobular emphysema.  Continues to smoke, but would like Chantix sent in to help quit. COPD status: {Blank single:19197::"controlled","uncontrolled","better","worse","exacerbated","stable"} Satisfied with current treatment?: {Blank single:19197::"yes","no"} Oxygen use: {Blank single:19197::"yes","no"} Dyspnea frequency:  Cough frequency:  Rescue inhaler frequency:   Limitation of activity: {Blank single:19197::"yes","no"} Productive cough:  Last Spirometry:  Pneumovax: {Blank single:19197::"Up to Date","Not up to Date","unknown"} Influenza: {Blank single:19197::"Up to Date","Not up to Date","unknown"}   HYPERLIPIDEMIA Continues Atorvastatin daily. Hyperlipidemia status: {Blank single:19197::"excellent compliance","good compliance","fair compliance","poor compliance"} Satisfied with current treatment?  {Blank single:19197::"yes","no"} Side effects:  {Blank single:19197::"yes","no"} Medication compliance: {Blank single:19197::"excellent compliance","good compliance","fair compliance","poor compliance"} Supplements: {Blank multiple:19196::"none","fish oil","niacin","red yeast rice"} Aspirin:  {Blank single:19197::"yes","no"} The 10-year ASCVD risk score (Arnett DK, et al., 2019) is: 5.4%   Values used to calculate the score:     Age: 48 years      Sex: Female     Is Non-Hispanic African American: No     Diabetic: No     Tobacco smoker: Yes     Systolic Blood Pressure: 597 mmHg     Is BP treated: No     HDL Cholesterol: 68 mg/dL     Total Cholesterol: 164 mg/dL Chest pain:  {Blank single:19197::"yes","no"} Coronary artery disease:  {Blank single:19197::"yes","no"} Family history CAD:  {Blank single:19197::"yes","no"} Family history early CAD:  {Blank single:19197::"yes","no"}   Depression Screen done today and results listed below:     01/22/2022    1:14 PM 11/22/2021    2:32 PM 10/04/2020   10:52 AM 09/29/2019    1:15 PM  Depression screen PHQ 2/9  Decreased Interest 0 0 0 0  Down, Depressed, Hopeless 0 0 0 0  PHQ - 2 Score 0 0 0 0  Altered sleeping 0 0 0   Tired, decreased energy 1 0 1   Change in appetite 2 0 0   Feeling bad or failure about yourself  0 0 0   Trouble concentrating 0 0 0   Moving slowly or fidgety/restless 0 0 0   Suicidal thoughts 0 0 0   PHQ-9 Score 3 0 1   Difficult doing work/chores Not difficult at all       The patient {has/does not CBUL:84536} a history of falls. I {did/did not:19850} complete a risk assessment for falls. A plan of care for falls {was/was not:19852} documented.   Past Medical History:  Past Medical History:  Diagnosis Date   Atypical mole 11/04/2013   Right superior lateral pubic   No pertinent past medical history    Squamous cell carcinoma of skin 10/31/2017   right pretibial     Surgical History:  Past Surgical History:  Procedure Laterality Date   AUGMENTATION MAMMAPLASTY     BREAST ENHANCEMENT SURGERY     20+ yrs ago   BREAST SURGERY     COLONOSCOPY N/A  02/21/2021   Procedure: COLONOSCOPY;  Surgeon: Jonathon Bellows, MD;  Location: [facility] ENDOSCOPY;  Service: Gastroenterology;  Laterality: N/A;   FACIAL COSMETIC SURGERY      Medications:  Current Outpatient Medications on File Prior to Visit  Medication Sig   albuterol (VENTOLIN HFA) 108 (90 Base) MCG/ACT  inhaler Inhale 2 puffs into the lungs every 6 (six) hours as needed for wheezing or shortness of breath.   umeclidinium-vilanterol (ANORO ELLIPTA) 62.5-25 MCG/ACT AEPB Inhale 1 puff into the lungs daily.   No current facility-administered medications on file prior to visit.    Allergies:  Allergies  Allergen Reactions   Penicillins Anaphylaxis    Social History:  Social History   Socioeconomic History   Marital status: Single    Spouse name: Not on file   Number of children: Not on file   Years of education: Not on file   Highest education level: Not on file  Occupational History   Not on file  Tobacco Use   Smoking status: Every Day    Packs/day: 1.00    Years: 44.00    Total pack years: 44.00    Types: Cigarettes    Last attempt to quit: 03/13/2020    Years since quitting: 1.8   Smokeless tobacco: Never  Vaping Use   Vaping Use: Never used  Substance and Sexual Activity   Alcohol use: Yes    Comment: socially   Drug use: Never   Sexual activity: Not Currently  Other Topics Concern   Not on file  Social History Narrative   Not on file   Social Determinants of Health   Financial Resource Strain: Low Risk  (09/29/2019)   Overall Financial Resource Strain (CARDIA)    Difficulty of Paying Living Expenses: Not hard at all  Food Insecurity: No Food Insecurity (09/29/2019)   Hunger Vital Sign    Worried About Running Out of Food in the Last Year: Never true    [place]olo in the Last Year: Never true  Transportation Needs: No Transportation Needs (09/29/2019)   PRAPARE - Hydrologist (Medical): No    Lack of Transportation (Non-Medical): No  Physical Activity: Sufficiently Active (09/29/2019)   Exercise Vital Sign    Days of Exercise per Week: 5 days    Minutes of Exercise per Session: 30 min  Stress: No Stress Concern Present (09/29/2019)   [place]    Feeling of  Stress : Not at all  Social Connections: Unknown (09/29/2019)   Social Connection and Isolation Panel [NHANES]    Frequency of Communication with Friends and Family: Three times a week    Frequency of Social Gatherings with Friends and Family: Three times a week    Attends Religious Services: Never    Active Member of Clubs or Organizations: No    Attends Archivist Meetings: Never    Marital Status: Not on file  Intimate Partner Violence: Not on file   Social History   Tobacco Use  Smoking Status Every Day   Packs/day: 1.00   Years: 44.00   Total pack years: 44.00   Types: Cigarettes   Last attempt to quit: 03/13/2020   Years since quitting: 1.8  Smokeless Tobacco Never   Social History   Substance and Sexual Activity  Alcohol Use Yes   Comment: socially    Family History:  Family History  Problem Relation Age of Onset   Heart  attack Mother    Stroke Mother    Uterine cancer Mother    Colon cancer Mother    Heart attack Father    Hypertension Brother    Other Brother        Pacemaker   Emphysema Brother    Diabetes Maternal Grandmother    Heart disease Maternal Grandmother    Heart disease Maternal Grandfather    Heart disease Paternal Grandmother    Heart disease Paternal Grandfather     Past medical history, surgical history, medications, allergies, family history and social history reviewed with patient today and changes made to appropriate areas of the chart.   ROS All other ROS negative except what is listed above and in the HPI.      Objective:    BP 112/70   Pulse 81   Temp 98 F (36.7 C) (Oral)   Ht 5' 4.75" (1.645 m)   Wt 177 lb 6.4 oz (80.5 kg)   LMP 09/07/2010 (Approximate)   SpO2 94%   BMI 29.75 kg/m   Wt Readings from Last 3 Encounters:  01/22/22 177 lb 6.4 oz (80.5 kg)  11/22/21 179 lb 12.8 oz (81.6 kg)  07/25/21 177 lb (80.3 kg)    Physical Exam  Results for orders placed or performed in visit on 07/25/21  CBC with  Differential/Platelet  Result Value Ref Range   WBC 5.5 3.4 - 10.8 x10E3/uL   RBC 4.22 3.77 - 5.28 x10E6/uL   Hemoglobin 13.4 11.1 - 15.9 g/dL   Hematocrit 38.7 34.0 - 46.6 %   MCV 92 79 - 97 fL   MCH 31.8 26.6 - 33.0 pg   MCHC 34.6 31.5 - 35.7 g/dL   RDW 12.2 11.7 - 15.4 %   Platelets 225 150 - 450 x10E3/uL   Neutrophils 48 Not Estab. %   Lymphs 41 Not Estab. %   Monocytes 8 Not Estab. %   Eos 2 Not Estab. %   Basos 1 Not Estab. %   Neutrophils Absolute 2.6 1.4 - 7.0 x10E3/uL   Lymphocytes Absolute 2.2 0.7 - 3.1 x10E3/uL   Monocytes Absolute 0.4 0.1 - 0.9 x10E3/uL   EOS (ABSOLUTE) 0.1 0.0 - 0.4 x10E3/uL   Basophils Absolute 0.0 0.0 - 0.2 x10E3/uL   Immature Granulocytes 0 Not Estab. %   Immature Grans (Abs) 0.0 0.0 - 0.1 x10E3/uL  Comprehensive metabolic panel  Result Value Ref Range   Glucose 61 (L) 70 - 99 mg/dL   BUN 18 8 - 27 mg/dL   Creatinine, Ser 0.93 0.57 - 1.00 mg/dL   eGFR 69 >59 mL/min/1.73   BUN/Creatinine Ratio 19 12 - 28   Sodium 143 134 - 144 mmol/L   Potassium 4.4 3.5 - 5.2 mmol/L   Chloride 107 (H) 96 - 106 mmol/L   CO2 24 20 - 29 mmol/L   Calcium 9.1 8.7 - 10.3 mg/dL   Total Protein 6.7 6.0 - 8.5 g/dL   Albumin 4.7 3.8 - 4.8 g/dL   Globulin, Total 2.0 1.5 - 4.5 g/dL   Albumin/Globulin Ratio 2.4 (H) 1.2 - 2.2   Bilirubin Total 0.5 0.0 - 1.2 mg/dL   Alkaline Phosphatase 101 44 - 121 IU/L   AST 17 0 - 40 IU/L   ALT 16 0 - 32 IU/L  Vitamin B12  Result Value Ref Range   Vitamin B-12 289 232 - 1,245 pg/mL  Lipid Panel w/o Chol/HDL Ratio  Result Value Ref Range   Cholesterol, Total 164 100 - 199  mg/dL   Triglycerides 74 0 - 149 mg/dL   HDL 68 >39 mg/dL   VLDL Cholesterol Cal 14 5 - 40 mg/dL   LDL Chol Calc (NIH) 82 0 - 99 mg/dL      Assessment & Plan:   Problem List Items Addressed This Visit       Cardiovascular and Mediastinum   Aortic atherosclerosis (HCC)   Relevant Medications   atorvastatin (LIPITOR) 10 MG tablet   Other Relevant  Orders   Comprehensive metabolic panel   Lipid Panel w/o Chol/HDL Ratio     Respiratory   Centrilobular emphysema (HCC) - Primary   Relevant Medications   azithromycin (ZITHROMAX) 250 MG tablet   predniSONE (DELTASONE) 20 MG tablet   Varenicline Tartrate, Starter, (CHANTIX STARTING MONTH PAK) 0.5 MG X 11 & 1 MG X 42 TBPK   varenicline (CHANTIX CONTINUING MONTH PAK) 1 MG tablet (Start on 02/22/2022)   Other Relevant Orders   CBC with Differential/Platelet     Other   B12 deficiency   Relevant Orders   CBC with Differential/Platelet   Vitamin B12   Bilateral carotid bruits   Cigarette nicotine dependence without complication   Relevant Medications   Varenicline Tartrate, Starter, (CHANTIX STARTING MONTH PAK) 0.5 MG X 11 & 1 MG X 42 TBPK   varenicline (CHANTIX CONTINUING MONTH PAK) 1 MG tablet (Start on 02/22/2022)   Hyperlipemia   Relevant Medications   atorvastatin (LIPITOR) 10 MG tablet   Other Relevant Orders   Comprehensive metabolic panel   Lipid Panel w/o Chol/HDL Ratio   Other Visit Diagnoses     Varicose veins of leg with edema, bilateral       Relevant Medications   atorvastatin (LIPITOR) 10 MG tablet   Other Relevant Orders   Ambulatory referral to Vascular Surgery   Counseling for travel       She is traveling to [place] and is concerned for exacerbation with this.  Will send Zpack and Prednisone to take with her on trip.   Encounter for annual physical exam       Annual physical today with labs and health maintenance reviewed, discussed with patient.   Relevant Orders   TSH        Follow up plan: No follow-ups on file.   LABORATORY TESTING:  - Pap smear: {Blank HTDSKA:76811::"XBW done","not applicable","up to date","done elsewhere"}  IMMUNIZATIONS:   - Tdap: Tetanus vaccination status reviewed: {tetanus status:315746}. - Influenza: {Blank single:19197::"Up to date","Administered today","Postponed to flu season","Refused","Given elsewhere"} - Pneumovax:  {Blank single:19197::"Up to date","Administered today","Not applicable","Refused","Given elsewhere"} - Prevnar: {Blank single:19197::"Up to date","Administered today","Not applicable","Refused","Given elsewhere"} - COVID: {Blank single:19197::"Up to date","Administered today","Not applicable","Refused","Given elsewhere"} - HPV: {Blank single:19197::"Up to date","Administered today","Not applicable","Refused","Given elsewhere"} - Shingrix vaccine: {Blank single:19197::"Up to date","Administered today","Not applicable","Refused","Given elsewhere"}  SCREENING: -Mammogram: {Blank single:19197::"Up to date","Ordered today","Not applicable","Refused","Done elsewhere"}  - Colonoscopy: {Blank single:19197::"Up to date","Ordered today","Not applicable","Refused","Done elsewhere"}  - Bone Density: {Blank single:19197::"Up to date","Ordered today","Not applicable","Refused","Done elsewhere"}  -Hearing Test: {Blank single:19197::"Up to date","Ordered today","Not applicable","Refused","Done elsewhere"}  -Spirometry: {Blank single:19197::"Up to date","Ordered today","Not applicable","Refused","Done elsewhere"}   PATIENT COUNSELING:   Advised to take 1 mg of folate supplement per day if capable of pregnancy.   Sexuality: Discussed sexually transmitted diseases, partner selection, use of condoms, avoidance of unintended pregnancy  and contraceptive alternatives.   Advised to avoid cigarette smoking.  I discussed with the patient that most people either abstain from alcohol or drink within safe limits (<=14/week and <=4 drinks/occasion for males, <=7/weeks and <=  3 drinks/occasion for females) and that the risk for alcohol disorders and other health effects rises proportionally with the number of drinks per week and how often a drinker exceeds daily limits.  Discussed cessation/primary prevention of drug use and availability of treatment for abuse.   Diet: Encouraged to adjust caloric intake to maintain  or  achieve ideal body weight, to reduce intake of dietary saturated fat and total fat, to limit sodium intake by avoiding high sodium foods and not adding table salt, and to maintain adequate dietary potassium and calcium preferably from fresh fruits, vegetables, and low-fat dairy products.    Stressed the importance of regular exercise  Injury prevention: Discussed safety belts, safety helmets, smoke detector, smoking near bedding or upholstery.   Dental health: Discussed importance of regular tooth brushing, flossing, and dental visits.    NEXT PREVENTATIVE PHYSICAL DUE IN 1 YEAR. No follow-ups on file.

## 2022-01-22 NOTE — Progress Notes (Signed)
BP 112/70   Pulse 81   Temp 98 F (36.7 C) (Oral)   Ht 5' 4.75" (1.645 m)   Wt 177 lb 6.4 oz (80.5 kg)   LMP 09/07/2010 (Approximate)   SpO2 94%   BMI 29.75 kg/m    Subjective:    Patient ID: Aimee Lawson, female    DOB: Jun 07, 1961, 61 y.o.   MRN: [id number]  NOTE WRITTEN BY FNP STUDENT.  ASSESSMENT AND PLAN OF CARE REVIEWED WITH STUDENT, AGREE WITH ABOVE FINDINGS AND PLAN.   HPI: Aimee Lawson is a 61 y.o. female presenting on 01/22/2022 for comprehensive medical examination. Current medical complaints include: leg swelling and possible toenail fungus  She currently lives with: self Menopausal Symptoms: no  She has swelling in her lower legs with the L > R with varicose veins. She has tried compression hose without benefit. It seems to be worse with standing for a long time or sitting with her legs dangling. It causes a tight, aching pain in her legs that she rates as a 5/10. No improvement with Tylenol PRN or compression.  She also thinks she may have a fungal infection of her toenail. She says it turned black while she was in [place] which went away but it remains a yellow color.   COPD Currently treated with Anoro daily and Albuterol.  Attends lung screening with last done 12/26/20, noting centrilobular emphysema and aortic atherosclerosis. COPD status: controlled Satisfied with current treatment?: yes Oxygen use: no Dyspnea frequency:  Cough frequency:  Rescue inhaler frequency:   Limitation of activity: no Productive cough:  Last Spirometry: Pneumovax: Up to Date Influenza: Up to Date  HYPERLIPIDEMIA Continues on Atorvastatin daily.   Saw Dr. Delana Meyer in 2021 for bilateral bruits noted.  Imaging showed <50% stenosis, comparable to previous studies -- no intervention. Hyperlipidemia status: good compliance Satisfied with current treatment?  yes Side effects:  no Medication compliance: good compliance Supplements: none Aspirin:  no The 10-year ASCVD risk  score (Arnett DK, et al., 2019) is: 5.4%   Values used to calculate the score:     Age: 41 years     Sex: Female     Is Non-Hispanic African American: No     Diabetic: No     Tobacco smoker: Yes     Systolic Blood Pressure: 952 mmHg     Is BP treated: No     HDL Cholesterol: 68 mg/dL     Total Cholesterol: 164 mg/dL Chest pain:  no Coronary artery disease:  no Family history CAD:  yes Family history early CAD:  no    Depression Screen done today and results listed below:     01/22/2022    1:14 PM 11/22/2021    2:32 PM 10/04/2020   10:52 AM 09/29/2019    1:15 PM  Depression screen PHQ 2/9  Decreased Interest 0 0 0 0  Down, Depressed, Hopeless 0 0 0 0  PHQ - 2 Score 0 0 0 0  Altered sleeping 0 0 0   Tired, decreased energy 1 0 1   Change in appetite 2 0 0   Feeling bad or failure about yourself  0 0 0   Trouble concentrating 0 0 0   Moving slowly or fidgety/restless 0 0 0   Suicidal thoughts 0 0 0   PHQ-9 Score 3 0 1   Difficult doing work/chores Not difficult at all       The patient does not have a history of falls.  I did not complete a risk assessment for falls. A plan of care for falls was not documented.   Past Medical History:  Past Medical History:  Diagnosis Date   Atypical mole 11/04/2013   Right superior lateral pubic   No pertinent past medical history    Squamous cell carcinoma of skin 10/31/2017   right pretibial     Surgical History:  Past Surgical History:  Procedure Laterality Date   AUGMENTATION MAMMAPLASTY     BREAST ENHANCEMENT SURGERY     20+ yrs ago   BREAST SURGERY     COLONOSCOPY N/A 02/21/2021   Procedure: COLONOSCOPY;  Surgeon: Jonathon Bellows, MD;  Location: [facility] ENDOSCOPY;  Service: Gastroenterology;  Laterality: N/A;   FACIAL COSMETIC SURGERY      Medications:  Current Outpatient Medications on File Prior to Visit  Medication Sig   albuterol (VENTOLIN HFA) 108 (90 Base) MCG/ACT inhaler Inhale 2 puffs into the lungs every 6 (six)  hours as needed for wheezing or shortness of breath.   umeclidinium-vilanterol (ANORO ELLIPTA) 62.5-25 MCG/ACT AEPB Inhale 1 puff into the lungs daily.   No current facility-administered medications on file prior to visit.    Allergies:  Allergies  Allergen Reactions   Penicillins Anaphylaxis    Social History:  Social History   Socioeconomic History   Marital status: Single    Spouse name: Not on file   Number of children: Not on file   Years of education: Not on file   Highest education level: Not on file  Occupational History   Not on file  Tobacco Use   Smoking status: Every Day    Packs/day: 1.00    Years: 44.00    Total pack years: 44.00    Types: Cigarettes    Last attempt to quit: 03/13/2020    Years since quitting: 1.8   Smokeless tobacco: Never  Vaping Use   Vaping Use: Never used  Substance and Sexual Activity   Alcohol use: Yes    Comment: socially   Drug use: Never   Sexual activity: Not Currently  Other Topics Concern   Not on file  Social History Narrative   Not on file   Social Determinants of Health   Financial Resource Strain: Low Risk  (09/29/2019)   Overall Financial Resource Strain (CARDIA)    Difficulty of Paying Living Expenses: Not hard at all  Food Insecurity: No Food Insecurity (09/29/2019)   Hunger Vital Sign    Worried About Running Out of Food in the Last Year: Never true    [place] in the Last Year: Never true  Transportation Needs: No Transportation Needs (09/29/2019)   PRAPARE - Hydrologist (Medical): No    Lack of Transportation (Non-Medical): No  Physical Activity: Sufficiently Active (09/29/2019)   Exercise Vital Sign    Days of Exercise per Week: 5 days    Minutes of Exercise per Session: 30 min  Stress: No Stress Concern Present (09/29/2019)   [place]    Feeling of Stress : Not at all  Social Connections: Unknown  (09/29/2019)   Social Connection and Isolation Panel [NHANES]    Frequency of Communication with Friends and Family: Three times a week    Frequency of Social Gatherings with Friends and Family: Three times a week    Attends Religious Services: Never    Active Member of Clubs or Organizations: No    Attends  Archivist Meetings: Never    Marital Status: Not on file  Intimate Partner Violence: Not on file   Social History   Tobacco Use  Smoking Status Every Day   Packs/day: 1.00   Years: 44.00   Total pack years: 44.00   Types: Cigarettes   Last attempt to quit: 03/13/2020   Years since quitting: 1.8  Smokeless Tobacco Never   Social History   Substance and Sexual Activity  Alcohol Use Yes   Comment: socially    Family History:  Family History  Problem Relation Age of Onset   Heart attack Mother    Stroke Mother    Uterine cancer Mother    Colon cancer Mother    Heart attack Father    Hypertension Brother    Other Brother        Pacemaker   Emphysema Brother    Diabetes Maternal Grandmother    Heart disease Maternal Grandmother    Heart disease Maternal Grandfather    Heart disease Paternal Grandmother    Heart disease Paternal Grandfather     Past medical history, surgical history, medications, allergies, family history and social history reviewed with patient today and changes made to appropriate areas of the chart.   Review of Systems  Constitutional:  Negative for fever, malaise/fatigue and weight loss.  HENT: Negative.    Eyes: Negative.   Respiratory:  Negative for cough and shortness of breath.   Cardiovascular:  Positive for leg swelling.  Gastrointestinal: Negative.   Genitourinary: Negative.   Musculoskeletal: Negative.   Skin:  Negative for rash.  Neurological: Negative.   Endo/Heme/Allergies: Negative.   Psychiatric/Behavioral: Negative.     All other ROS negative except what is listed above and in the HPI.      Objective:    BP  112/70   Pulse 81   Temp 98 F (36.7 C) (Oral)   Ht 5' 4.75" (1.645 m)   Wt 177 lb 6.4 oz (80.5 kg)   LMP 09/07/2010 (Approximate)   SpO2 94%   BMI 29.75 kg/m   Wt Readings from Last 3 Encounters:  01/22/22 177 lb 6.4 oz (80.5 kg)  11/22/21 179 lb 12.8 oz (81.6 kg)  07/25/21 177 lb (80.3 kg)    Physical Exam Vitals and nursing note reviewed. Exam conducted with a chaperone present.  Constitutional:      Appearance: Normal appearance. She is not ill-appearing or toxic-appearing.  HENT:     Head: Normocephalic and atraumatic.     Right Ear: Hearing, tympanic membrane, ear canal and external ear normal.     Left Ear: Hearing, tympanic membrane, ear canal and external ear normal.     Nose: Nose normal.     Mouth/Throat:     Lips: Pink.     Mouth: Mucous membranes are moist.     Pharynx: Oropharynx is clear. Uvula midline. No posterior oropharyngeal erythema.  Eyes:     General: Lids are normal.        Right eye: No discharge.        Left eye: No discharge.     Extraocular Movements: Extraocular movements intact.  Neck:     Thyroid: No thyromegaly or thyroid tenderness.  Cardiovascular:     Rate and Rhythm: Normal rate and regular rhythm.     Pulses: Normal pulses.     Heart sounds: Normal heart sounds. No murmur heard. Pulmonary:     Effort: Pulmonary effort is normal. No accessory muscle usage or respiratory  distress.     Breath sounds: Normal breath sounds. No wheezing.  Chest:  Breasts:    Right: Normal.     Left: Normal.  Abdominal:     General: Bowel sounds are normal.     Palpations: Abdomen is soft.     Tenderness: There is no abdominal tenderness. There is no right CVA tenderness or left CVA tenderness.  Musculoskeletal:     Cervical back: Normal range of motion and neck supple.     Right lower leg: No edema.     Left lower leg: No edema.  Feet:     Right foot:     Toenail Condition: Fungal disease present.    Left foot:     Toenail Condition: Fungal  disease present. Lymphadenopathy:     Head:     Right side of head: No submental, submandibular, tonsillar, preauricular or posterior auricular adenopathy.     Left side of head: No submental, submandibular, tonsillar, preauricular or posterior auricular adenopathy.     Cervical: No cervical adenopathy.  Skin:    General: Skin is warm.     Capillary Refill: Capillary refill takes less than 2 seconds.  Neurological:     General: No focal deficit present.     Mental Status: She is alert.     Deep Tendon Reflexes: Reflexes are normal and symmetric.  Psychiatric:        Attention and Perception: Attention normal.        Mood and Affect: Mood normal.        Speech: Speech normal.        Behavior: Behavior normal. Behavior is cooperative.        Thought Content: Thought content normal.        Cognition and Memory: Cognition normal.        Judgment: Judgment normal.    Results for orders placed or performed in visit on 07/25/21  CBC with Differential/Platelet  Result Value Ref Range   WBC 5.5 3.4 - 10.8 x10E3/uL   RBC 4.22 3.77 - 5.28 x10E6/uL   Hemoglobin 13.4 11.1 - 15.9 g/dL   Hematocrit 38.7 34.0 - 46.6 %   MCV 92 79 - 97 fL   MCH 31.8 26.6 - 33.0 pg   MCHC 34.6 31.5 - 35.7 g/dL   RDW 12.2 11.7 - 15.4 %   Platelets 225 150 - 450 x10E3/uL   Neutrophils 48 Not Estab. %   Lymphs 41 Not Estab. %   Monocytes 8 Not Estab. %   Eos 2 Not Estab. %   Basos 1 Not Estab. %   Neutrophils Absolute 2.6 1.4 - 7.0 x10E3/uL   Lymphocytes Absolute 2.2 0.7 - 3.1 x10E3/uL   Monocytes Absolute 0.4 0.1 - 0.9 x10E3/uL   EOS (ABSOLUTE) 0.1 0.0 - 0.4 x10E3/uL   Basophils Absolute 0.0 0.0 - 0.2 x10E3/uL   Immature Granulocytes 0 Not Estab. %   Immature Grans (Abs) 0.0 0.0 - 0.1 x10E3/uL  Comprehensive metabolic panel  Result Value Ref Range   Glucose 61 (L) 70 - 99 mg/dL   BUN 18 8 - 27 mg/dL   Creatinine, Ser 0.93 0.57 - 1.00 mg/dL   eGFR 69 >59 mL/min/1.73   BUN/Creatinine Ratio 19 12 - 28    Sodium 143 134 - 144 mmol/L   Potassium 4.4 3.5 - 5.2 mmol/L   Chloride 107 (H) 96 - 106 mmol/L   CO2 24 20 - 29 mmol/L   Calcium 9.1 8.7 - 10.3 mg/dL  Total Protein 6.7 6.0 - 8.5 g/dL   Albumin 4.7 3.8 - 4.8 g/dL   Globulin, Total 2.0 1.5 - 4.5 g/dL   Albumin/Globulin Ratio 2.4 (H) 1.2 - 2.2   Bilirubin Total 0.5 0.0 - 1.2 mg/dL   Alkaline Phosphatase 101 44 - 121 IU/L   AST 17 0 - 40 IU/L   ALT 16 0 - 32 IU/L  Vitamin B12  Result Value Ref Range   Vitamin B-12 289 232 - 1,245 pg/mL  Lipid Panel w/o Chol/HDL Ratio  Result Value Ref Range   Cholesterol, Total 164 100 - 199 mg/dL   Triglycerides 74 0 - 149 mg/dL   HDL 68 >39 mg/dL   VLDL Cholesterol Cal 14 5 - 40 mg/dL   LDL Chol Calc (NIH) 82 0 - 99 mg/dL      Assessment & Plan:   Problem List Items Addressed This Visit       Cardiovascular and Mediastinum   Aortic atherosclerosis ([place])    Noted on lung CT screening.  Continue statin and ASA daily for prevention purposes.  Recommend complete cessation of smoking.      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 She is experiencing swelling in her legs. She has tried compression hose without benefit. Referral to vascular.       Relevant Medications   atorvastatin (LIPITOR) 10 MG tablet   Other Relevant Orders   Ambulatory referral to Vascular Surgery     Respiratory   Centrilobular emphysema ([place]) - Primary    Noted on lung screening. Continue Anoro and Albuterol and adjust as needed.       Relevant Medications   azithromycin (ZITHROMAX) 250 MG tablet   predniSONE (DELTASONE) 20 MG tablet   Varenicline Tartrate, Starter, (CHANTIX STARTING MONTH PAK) 0.5 MG X 11 & 1 MG X 42 TBPK   varenicline (CHANTIX CONTINUING MONTH PAK) 1 MG tablet (Start on 02/22/2022)   Other Relevant Orders   CBC with  Differential/Platelet     Other   B12 deficiency    Noted on past labs. Recommend she take B12 supplement consistently. Recheck level today.       Relevant Orders   CBC with Differential/Platelet   Vitamin B12   Bilateral carotid bruits    <50% stenosis on previous imaging. Follow up with vascular as needed. Recommend complete cessation of smoking, Chantix ordered per patient request to help with this.       Cigarette nicotine dependence without complication    Recommend complete cessation of tobacco use. She is requesting Chantix at this time. Order sent for this.       Relevant Medications   Varenicline Tartrate, Starter, (CHANTIX STARTING MONTH PAK) 0.5 MG X 11 & 1 MG X 42 TBPK   varenicline (CHANTIX CONTINUING MONTH PAK) 1 MG tablet (Start on 02/22/2022)   Hyperlipemia    Chronic, ongoing. Continue Atorvastatin daily and adjust as needed. Check lipid panel and CMP today. Recommend diet focus and modest weight loss.       Relevant Medications   atorvastatin (LIPITOR) 10 MG tablet   Other Relevant Orders   Comprehensive metabolic panel   Lipid Panel w/o Chol/HDL Ratio   Other Visit Diagnoses     Counseling for travel       She is traveling to  [place] and is concerned for exacerbation with this.  Will send Zpack and Prednisone to take with her on trip.   Encounter for annual physical exam       Annual physical today with labs and health maintenance reviewed, discussed with patient.   Relevant Orders   TSH        Follow up plan: Return in about 6 months (around 07/25/2022) for HLD and COPD.   LABORATORY TESTING:  - Pap smear: up to date  IMMUNIZATIONS:   - Tdap: Tetanus vaccination status reviewed: last tetanus booster within 10 years. - Influenza: Up to date - Pneumovax: Up to date - Prevnar: Up to date - COVID: Up to date - HPV: Not applicable - Shingrix vaccine: Up to date  SCREENING: -Mammogram: Up to date  - Colonoscopy: Up to date  - Bone Density: Up  to date  -Hearing Test: Not applicable  -Spirometry: due this fall  PATIENT COUNSELING:   Advised to take 1 mg of folate supplement per day if capable of pregnancy.   Sexuality: Discussed sexually transmitted diseases, partner selection, use of condoms, avoidance of unintended pregnancy  and contraceptive alternatives.   Advised to avoid cigarette smoking.  I discussed with the patient that most people either abstain from alcohol or drink within safe limits (<=14/week and <=4 drinks/occasion for males, <=7/weeks and <= 3 drinks/occasion for females) and that the risk for alcohol disorders and other health effects rises proportionally with the number of drinks per week and how often a drinker exceeds daily limits.  Discussed cessation/primary prevention of drug use and availability of treatment for abuse.   Diet: Encouraged to adjust caloric intake to maintain  or achieve ideal body weight, to reduce intake of dietary saturated fat and total fat, to limit sodium intake by avoiding high sodium foods and not adding table salt, and to maintain adequate dietary potassium and calcium preferably from fresh fruits, vegetables, and low-fat dairy products.    Stressed the importance of regular exercise  Injury prevention: Discussed safety belts, safety helmets, smoke detector, smoking near bedding or upholstery.   Dental health: Discussed importance of regular tooth brushing, flossing, and dental visits.    NEXT PREVENTATIVE PHYSICAL DUE IN 1 YEAR. Return in about 6 months (around 07/25/2022) for HLD and COPD.

## 2022-01-22 NOTE — Assessment & Plan Note (Signed)
Noted on CT lung screening, no current symptoms. Continue to monitor.

## 2022-01-23 ENCOUNTER — Other Ambulatory Visit: Payer: Self-pay | Admitting: Nurse Practitioner

## 2022-01-23 LAB — COMPREHENSIVE METABOLIC PANEL
ALT: 11 IU/L (ref 0–32)
AST: 16 IU/L (ref 0–40)
Albumin/Globulin Ratio: 2 (ref 1.2–2.2)
Albumin: 4.5 g/dL (ref 3.9–4.9)
Alkaline Phosphatase: 91 IU/L (ref 44–121)
BUN/Creatinine Ratio: 13 (ref 12–28)
BUN: 15 mg/dL (ref 8–27)
Bilirubin Total: 0.4 mg/dL (ref 0.0–1.2)
CO2: 23 mmol/L (ref 20–29)
Calcium: 9.4 mg/dL (ref 8.7–10.3)
Chloride: 104 mmol/L (ref 96–106)
Creatinine, Ser: 1.17 mg/dL — ABNORMAL HIGH (ref 0.57–1.00)
Globulin, Total: 2.2 g/dL (ref 1.5–4.5)
Glucose: 60 mg/dL — ABNORMAL LOW (ref 70–99)
Potassium: 4 mmol/L (ref 3.5–5.2)
Sodium: 142 mmol/L (ref 134–144)
Total Protein: 6.7 g/dL (ref 6.0–8.5)
eGFR: 52 mL/min/{1.73_m2} — ABNORMAL LOW (ref >59)

## 2022-01-23 LAB — CBC WITH DIFFERENTIAL/PLATELET
Basophils Absolute: 0 10*3/uL (ref 0.0–0.2)
Basos: 1 %
EOS (ABSOLUTE): 0.1 10*3/uL (ref 0.0–0.4)
Eos: 1 %
Hematocrit: 39.7 % (ref 34.0–46.6)
Hemoglobin: 13.3 g/dL (ref 11.1–15.9)
Immature Grans (Abs): 0 10*3/uL (ref 0.0–0.1)
Immature Granulocytes: 0 %
Lymphocytes Absolute: 2.4 10*3/uL (ref 0.7–3.1)
Lymphs: 38 %
MCH: 31.8 pg (ref 26.6–33.0)
MCHC: 33.5 g/dL (ref 31.5–35.7)
MCV: 95 fL (ref 79–97)
Monocytes Absolute: 0.5 10*3/uL (ref 0.1–0.9)
Monocytes: 8 %
Neutrophils Absolute: 3.3 10*3/uL (ref 1.4–7.0)
Neutrophils: 52 %
Platelets: 228 10*3/uL (ref 150–450)
RBC: 4.18 x10E6/uL (ref 3.77–5.28)
RDW: 12.4 % (ref 11.7–15.4)
WBC: 6.3 10*3/uL (ref 3.4–10.8)

## 2022-01-23 LAB — LIPID PANEL W/O CHOL/HDL RATIO
Cholesterol, Total: 207 mg/dL — ABNORMAL HIGH (ref 100–199)
HDL: 77 mg/dL (ref >39)
LDL Chol Calc (NIH): 111 mg/dL — ABNORMAL HIGH (ref 0–99)
Triglycerides: 109 mg/dL (ref 0–149)
VLDL Cholesterol Cal: 19 mg/dL (ref 5–40)

## 2022-01-23 LAB — VITAMIN B12: Vitamin B-12: 301 pg/mL (ref 232–1245)

## 2022-01-23 LAB — TSH: TSH: 2.15 u[IU]/mL (ref 0.450–4.500)

## 2022-01-23 MED ORDER — TERBINAFINE HCL 1 % EX SOLN
1.0000 | Freq: Two times a day (BID) | CUTANEOUS | 0 refills | Status: DC
Start: 2022-01-23 — End: 2022-03-25

## 2022-01-23 MED ORDER — ATORVASTATIN CALCIUM 20 MG PO TABS: 20.0000 mg | ORAL_TABLET | Freq: Every day | ORAL | 3 refills | Status: DC

## 2022-01-23 NOTE — Telephone Encounter (Signed)
  Notes to clinic:  Pharm request as follows: Pharmacy comment: Alternative Requested:NOT COVERED BY INSURANCE.      Requested Prescriptions  Pending Prescriptions Disp Refills   ciclopirox (PENLAC) 8 % solution [Pharmacy Med Name: CICLOPIROX 8% SOLUTION] 6.6 mL 0    Sig: Apply topically at bedtime. Apply over nail and surrounding skin. Apply daily over previous coat. After seven (7) days, may remove with alcohol and continue cycle.     Off-Protocol Failed - 01/22/2022  1:57 PM      Failed - Medication not assigned to a protocol, review manually.      Passed - Valid encounter within last 12 months    Recent Outpatient Visits           Yesterday Centrilobular emphysema ([place])   [place], [place] T, NP   2 months ago COPD exacerbation ([place])   [place], Henrine Screws T, NP   6 months ago Centrilobular emphysema ([place])   [place], Jolene T, NP   10 months ago Left upper quadrant abdominal pain   [place], NP   1 year ago Centrilobular emphysema ([place])   [place], Barbaraann Faster, NP       Future Appointments             In 6 months Cannady, Barbaraann Faster, NP [facility]

## 2022-01-23 NOTE — Progress Notes (Signed)
Contacted via [employer] morning Aimee Lawson, your labs have returned: - Kidney function on this check is showing some mild kidney disease, creatinine and eGFR.  We will monitor this and I highly recommend you increase water intake and add a little lemon to this.  Liver function, AST and ALT, is normal. - Cholesterol labs are above goal.  Would like to see LDL <70.  I am going to increase your Atorvastatin to 20 MG and recommend starting this dose once you have completed current 10 MG pills.  We will recheck next visit. - CBC shows no anemia or infection.  Thyroid lab is normal, TSH. - B12 remains on lower side, please ensure you are taking Vitamin B12 1000 MCG daily:) Any questions?  Enjoy your travels, I will live vicariously through you:) Keep being wonderful!!  Thank you for allowing me to participate in your care.  I appreciate you. Kindest regards, Nadie Fiumara

## 2022-01-29 ENCOUNTER — Other Ambulatory Visit: Payer: Self-pay | Admitting: *Deleted

## 2022-01-29 DIAGNOSIS — F17210 Nicotine dependence, cigarettes, uncomplicated: Secondary | ICD-10-CM

## 2022-01-29 DIAGNOSIS — Z122 Encounter for screening for malignant neoplasm of respiratory organs: Secondary | ICD-10-CM

## 2022-01-29 DIAGNOSIS — Z87891 Personal history of nicotine dependence: Secondary | ICD-10-CM

## 2022-01-30 ENCOUNTER — Other Ambulatory Visit: Payer: Self-pay | Admitting: Nurse Practitioner

## 2022-01-31 NOTE — Telephone Encounter (Signed)
Requested Prescriptions  Pending Prescriptions Disp Refills  . ANORO ELLIPTA 62.5-25 MCG/ACT AEPB [Pharmacy Med Name: ANORO ELLIPTA 62.5-25 MCG INH] 37 each 4    Sig: TAKE 1 PUFF BY MOUTH EVERY DAY     Pulmonology:  Combination Products Passed - 01/30/2022 11:24 AM      Passed - Valid encounter within last 12 months    Recent Outpatient Visits          1 week ago Centrilobular emphysema ([place])   [place], [place] T, NP   2 months ago COPD exacerbation ([place])   [place] Cannady, [place] T, NP   6 months ago Centrilobular emphysema ([place])   [place], Jolene T, NP   10 months ago Left upper quadrant abdominal pain   [place], NP   1 year ago Centrilobular emphysema ([place])   [place], Barbaraann Faster, NP      Future Appointments            In 6 months Cannady, Barbaraann Faster, NP [facility]

## 2022-03-06 ENCOUNTER — Ambulatory Visit
Admission: RE | Admit: 2022-03-06 | Discharge: 2022-03-06 | Disposition: A | Discharge status: Home / Self Care | Payer: 59 | Source: Ambulatory Visit | Attending: Acute Care | Admitting: Acute Care

## 2022-03-06 DIAGNOSIS — R69 Illness, unspecified: Secondary | ICD-10-CM | POA: Diagnosis not present

## 2022-03-06 DIAGNOSIS — F17210 Nicotine dependence, cigarettes, uncomplicated: Secondary | ICD-10-CM

## 2022-03-06 DIAGNOSIS — Z87891 Personal history of nicotine dependence: Secondary | ICD-10-CM

## 2022-03-06 DIAGNOSIS — Z122 Encounter for screening for malignant neoplasm of respiratory organs: Secondary | ICD-10-CM

## 2022-03-08 ENCOUNTER — Other Ambulatory Visit: Payer: Self-pay | Admitting: Acute Care

## 2022-03-08 DIAGNOSIS — F17210 Nicotine dependence, cigarettes, uncomplicated: Secondary | ICD-10-CM

## 2022-03-08 DIAGNOSIS — Z87891 Personal history of nicotine dependence: Secondary | ICD-10-CM

## 2022-03-08 DIAGNOSIS — Z122 Encounter for screening for malignant neoplasm of respiratory organs: Secondary | ICD-10-CM

## 2022-03-13 ENCOUNTER — Other Ambulatory Visit (INDEPENDENT_AMBULATORY_CARE_PROVIDER_SITE_OTHER): Payer: Self-pay | Admitting: Nurse Practitioner

## 2022-03-13 DIAGNOSIS — I83893 Varicose veins of bilateral lower extremities with other complications: Secondary | ICD-10-CM

## 2022-03-14 ENCOUNTER — Ambulatory Visit (INDEPENDENT_AMBULATORY_CARE_PROVIDER_SITE_OTHER): Payer: 59

## 2022-03-14 ENCOUNTER — Encounter (INDEPENDENT_AMBULATORY_CARE_PROVIDER_SITE_OTHER): Payer: Self-pay | Admitting: Nurse Practitioner

## 2022-03-14 ENCOUNTER — Ambulatory Visit (INDEPENDENT_AMBULATORY_CARE_PROVIDER_SITE_OTHER): Payer: 59 | Admitting: Nurse Practitioner

## 2022-03-14 DIAGNOSIS — I83893 Varicose veins of bilateral lower extremities with other complications: Secondary | ICD-10-CM

## 2022-03-19 ENCOUNTER — Ambulatory Visit: Payer: Self-pay | Admitting: *Deleted

## 2022-03-19 MED ORDER — ANORO ELLIPTA 62.5-25 MCG/ACT IN AEPB: INHALATION_SPRAY | RESPIRATORY_TRACT | 4 refills | Status: DC

## 2022-03-19 MED ORDER — ATORVASTATIN CALCIUM 20 MG PO TABS: 20.0000 mg | ORAL_TABLET | Freq: Every day | ORAL | 3 refills | Status: DC

## 2022-03-19 NOTE — Telephone Encounter (Signed)
Call received from [place] from [facility] service requesting PCP to escribe order for refill early for anoro ellipta and atorvastatin for 90 day refill to [facility] mail service due to patient going on vacation and requesting medication so she does not run out. Local pharmacy allows for #30. Please call patient regarding if order can be escribed or not.  Reason for Disposition  [1] Pharmacy calling with prescription questions AND [2] triager unable to answer question  Answer Assessment - Initial Assessment Questions 1. DRUG NAME: "What medicine do you need to have refilled?"     Anoro ellipta inhaler and atorvastatin 2. REFILLS REMAINING: "How many refills are remaining?" (Note: The label on the medicine or pill bottle will show how many refills are remaining. If there are no refills remaining, then a renewal may be needed.)     na 3. EXPIRATION DATE: "What is the expiration date?" (Note: The label states when the prescription will expire, and thus can no longer be refilled.)     na 4. PRESCRIBING HCP: "Who prescribed it?" Reason: If prescribed by specialist, call should be referred to that group.     PCP 5. SYMPTOMS: "Do you have any symptoms?"     na 6. PREGNANCY: "Is there any chance that you are pregnant?" "When was your last menstrual period?"     na  Protocols used: Medication Refill and Renewal Call-A-AH

## 2022-03-20 ENCOUNTER — Encounter: Payer: Self-pay | Admitting: Nurse Practitioner

## 2022-03-21 ENCOUNTER — Other Ambulatory Visit: Payer: Self-pay | Admitting: Nurse Practitioner

## 2022-03-21 NOTE — Telephone Encounter (Signed)
Pt scheduled  

## 2022-03-22 DIAGNOSIS — L821 Other seborrheic keratosis: Secondary | ICD-10-CM | POA: Diagnosis not present

## 2022-03-22 DIAGNOSIS — L578 Other skin changes due to chronic exposure to nonionizing radiation: Secondary | ICD-10-CM | POA: Diagnosis not present

## 2022-03-22 DIAGNOSIS — L570 Actinic keratosis: Secondary | ICD-10-CM | POA: Diagnosis not present

## 2022-03-22 NOTE — Telephone Encounter (Signed)
Requested medication (s) are due for refill today: yes for 90 days  Requested medication (s) are on the active medication list: yes  Last refill:  03/19/22  Future visit scheduled: yes  Notes to clinic:  Unable to refill per protocol, last refill by provider 03/19/22. Pharmacy comment: Your patients insurance requires a minimum days supply of 31 for mail order. May we adjust the quantity to 90 days supply YES__or NO__  REFILLS___.     Requested Prescriptions  Pending Prescriptions Disp Refills   ANORO ELLIPTA 62.5-25 MCG/ACT AEPB [Pharmacy Med Name: ANORO ELLIPT [place] 62.5-25]  3    Sig: USE 1 INHALATION ORALLY    DAILY     Pulmonology:  Combination Products Passed - 03/21/2022 12:19 PM      Passed - Valid encounter within last 12 months    Recent Outpatient Visits           1 month ago Centrilobular emphysema ([place])   [place] Cannady, Jolene T, NP   4 months ago COPD exacerbation ([place])   [place] Cannady, [place] T, NP   8 months ago Centrilobular emphysema ([place])   [place], [place] T, NP   11 months ago Left upper quadrant abdominal pain   [place], NP   1 year ago Centrilobular emphysema ([place])   [place], Barbaraann Faster, NP       Future Appointments             In 4 months Cannady, Barbaraann Faster, NP [facility]

## 2022-03-25 ENCOUNTER — Other Ambulatory Visit: Payer: Self-pay | Admitting: Nurse Practitioner

## 2022-04-06 ENCOUNTER — Encounter (INDEPENDENT_AMBULATORY_CARE_PROVIDER_SITE_OTHER): Payer: Self-pay | Admitting: Nurse Practitioner

## 2022-04-06 NOTE — Progress Notes (Signed)
Subjective:    Patient ID: Aimee Lawson, female    DOB: 1960-10-24, 61 y.o.   MRN: [id number] Chief Complaint  Patient presents with   New Patient (Initial Visit)    Ref [facility] consult bilateral vv with edema    The patient is seen for evaluation of symptomatic varicose veins. The patient relates burning and stinging which worsened steadily throughout the course of the day, particularly with standing. The patient also notes an aching and throbbing pain over the varicosities, particularly with prolonged dependent positions. The symptoms are significantly improved with elevation.  The patient also notes that during hot weather the symptoms are greatly intensified. The patient states the pain from the varicose veins interferes with work, daily exercise, shopping and household maintenance. At this point, the symptoms are persistent and severe enough that they're having a negative impact on lifestyle and are interfering with daily activities.  This has been ongoing for several years.  There is no history of DVT, PE or superficial thrombophlebitis. There is no history of ulceration or hemorrhage. The patient denies a significant family history of varicose veins.  The patient has worn graduated compression socks for the last few years.  These are no longer helping to provide any relief.  She notes elevation when not active as well as use over-the-counter pain medication as needed.  The patient has deep venous insufficiency in the right lower extremity there is none in the left.  There is extensive superficial venous reflux in the left great saphenous vein extending from the saphenofemoral junction to the proximal calf.  No evidence of DVT or superficial thrombophlebitis bilaterally       Review of Systems  Cardiovascular:  Positive for leg swelling.  All other systems reviewed and are negative.      Objective:   Physical Exam Vitals reviewed.  HENT:     Head: Normocephalic.   Cardiovascular:     Rate and Rhythm: Normal rate.     Pulses: Normal pulses.  Pulmonary:     Effort: Pulmonary effort is normal.  Musculoskeletal:     Left lower leg: Edema present.  Skin:    General: Skin is warm and dry.  Neurological:     Mental Status: She is alert and oriented to person, place, and time.  Psychiatric:        Mood and Affect: Mood normal.        Behavior: Behavior normal.        Thought Content: Thought content normal.        Judgment: Judgment normal.     BP (!) 147/73 (BP Location: Right Arm)   Pulse 76   Resp 16   Wt 179 lb 12.8 oz (81.6 kg)   LMP 09/07/2010 (Approximate)   BMI 30.15 kg/m   Past Medical History:  Diagnosis Date   Atypical mole 11/04/2013   Right superior lateral pubic   No pertinent past medical history    Squamous cell carcinoma of skin 10/31/2017   right pretibial     Social History   Socioeconomic History   Marital status: Single    Spouse name: Not on file   Number of children: Not on file   Years of education: Not on file   Highest education level: Not on file  Occupational History   Not on file  Tobacco Use   Smoking status: Every Day    Packs/day: 1.00    Years: 44.00    Total pack years: 44.00  Types: Cigarettes    Last attempt to quit: 03/13/2020    Years since quitting: 2.0   Smokeless tobacco: Never  Vaping Use   Vaping Use: Never used  Substance and Sexual Activity   Alcohol use: Yes    Comment: socially   Drug use: Never   Sexual activity: Not Currently  Other Topics Concern   Not on file  Social History Narrative   Not on file   Social Determinants of Health   Financial Resource Strain: Low Risk  (09/29/2019)   Overall Financial Resource Strain (CARDIA)    Difficulty of Paying Living Expenses: Not hard at all  Food Insecurity: No Food Insecurity (09/29/2019)   Hunger Vital Sign    Worried About Running Out of Food in the Last Year: Never true    Ran Out of Food in the Last Year: Never  true  Transportation Needs: No Transportation Needs (09/29/2019)   PRAPARE - Hydrologist (Medical): No    Lack of Transportation (Non-Medical): No  Physical Activity: Sufficiently Active (09/29/2019)   Exercise Vital Sign    Days of Exercise per Week: 5 days    Minutes of Exercise per Session: 30 min  Stress: No Stress Concern Present (09/29/2019)   [place]    Feeling of Stress : Not at all  Social Connections: Unknown (09/29/2019)   Social Connection and Isolation Panel [NHANES]    Frequency of Communication with Friends and Family: Three times a week    Frequency of Social Gatherings with Friends and Family: Three times a week    Attends Religious Services: Never    Active Member of Clubs or Organizations: No    Attends Archivist Meetings: Never    Marital Status: Not on file  Intimate Partner Violence: Not on file    Past Surgical History:  Procedure Laterality Date   AUGMENTATION MAMMAPLASTY     BREAST ENHANCEMENT SURGERY     20+ yrs ago   BREAST SURGERY     COLONOSCOPY N/A 02/21/2021   Procedure: COLONOSCOPY;  Surgeon: Jonathon Bellows, MD;  Location: [facility];  Service: Gastroenterology;  Laterality: N/A;   FACIAL COSMETIC SURGERY      Family History  Problem Relation Age of Onset   Heart attack Mother    Stroke Mother    Uterine cancer Mother    Colon cancer Mother    Heart attack Father    Hypertension Brother    Other Brother        Pacemaker   Emphysema Brother    Diabetes Maternal Grandmother    Heart disease Maternal Grandmother    Heart disease Maternal Grandfather    Heart disease Paternal Grandmother    Heart disease Paternal Grandfather     Allergies  Allergen Reactions   Penicillins Anaphylaxis       Latest Ref Rng & Units 01/22/2022    1:50 PM 07/25/2021    2:56 PM 03/28/2021    1:32 PM  CBC  WBC 3.4 - 10.8 x10E3/uL 6.3  5.5  7.0    Hemoglobin 11.1 - 15.9 g/dL 13.3  13.4  12.6   Hematocrit 34.0 - 46.6 % 39.7  38.7  37.0   Platelets 150 - 450 x10E3/uL 228  225  215       CMP     Component Value Date/Time   NA 142 01/22/2022 1350   K 4.0 01/22/2022 1350   CL  104 01/22/2022 1350   CO2 23 01/22/2022 1350   GLUCOSE 60 (L) 01/22/2022 1350   BUN 15 01/22/2022 1350   CREATININE 1.17 (H) 01/22/2022 1350   CALCIUM 9.4 01/22/2022 1350   PROT 6.7 01/22/2022 1350   ALBUMIN 4.5 01/22/2022 1350   AST 16 01/22/2022 1350   ALT 11 01/22/2022 1350   ALKPHOS 91 01/22/2022 1350   BILITOT 0.4 01/22/2022 1350   GFRNONAA 62 03/03/2020 1053   GFRAA 71 03/03/2020 1053     No results found.     Assessment & Plan:   1. Varicose veins of leg with edema, bilateral Recommend  I have reviewed my  discussion with the patient regarding  varicose veins and why they cause symptoms. Patient will continue  wearing graduated compression stockings class 1 on a daily basis, beginning first thing in the morning and removing them in the evening.    CEAP C3s  In addition, behavioral modification including elevation during the day was again discussed and this will continue.  The patient has utilized over the counter pain medications and has been exercising.  However, at this time conservative therapy has not alleviated the patient's symptoms of leg pain and swelling  Recommend: laser ablation of the left great saphenous veins to eliminate the symptoms of pain and swelling of the lower extremities caused by the severe superficial venous reflux disease.   - VAS [place] LOWER EXTREMITY VENOUS REFLUX   Current Outpatient Medications on File Prior to Visit  Medication Sig Dispense Refill   albuterol (VENTOLIN HFA) 108 (90 Base) MCG/ACT inhaler Inhale 2 puffs into the lungs every 6 (six) hours as needed for wheezing or shortness of breath. 18 g 4   ciclopirox (PENLAC) 8 % solution Apply topically at bedtime. Apply over nail and surrounding  skin. Apply daily over previous coat. After seven (7) days, may remove with alcohol and continue cycle. 6.6 mL 0   varenicline (CHANTIX CONTINUING MONTH PAK) 1 MG tablet Take 1 tablet (1 mg total) by mouth 2 (two) times daily. 60 tablet 4   Varenicline Tartrate, Starter, (CHANTIX STARTING MONTH PAK) 0.5 MG X 11 & 1 MG X 42 TBPK Take one 0.5 mg tablet by mouth once daily for 3 days, then increase to one 0.5 mg tablet twice daily for 4 days, then increase to one 1 mg tablet twice daily. 53 each 0   No current facility-administered medications on file prior to visit.    There are no Patient Instructions on file for this visit. No follow-ups on file.   Kris Hartmann, NP

## 2022-05-04 IMAGING — MG DIGITAL SCREENING BREAST BILAT IMPLANT W/ TOMO W/ CAD
9 of 16 series · 9 of 36 positions shown · non-contrast
Comparison: Previous exam(s).

CLINICAL DATA: Screening.

EXAM:
DIGITAL SCREENING BILATERAL MAMMOGRAM WITH IMPLANTS, CAD AND
TOMOSYNTHESIS
TECHNIQUE: Bilateral screening digital craniocaudal and mediolateral oblique
mammograms were obtained. Bilateral screening digital breast
tomosynthesis was performed. The images were evaluated with
computer-aided detection. Standard and/or implant displaced views
were performed.

[R CC]
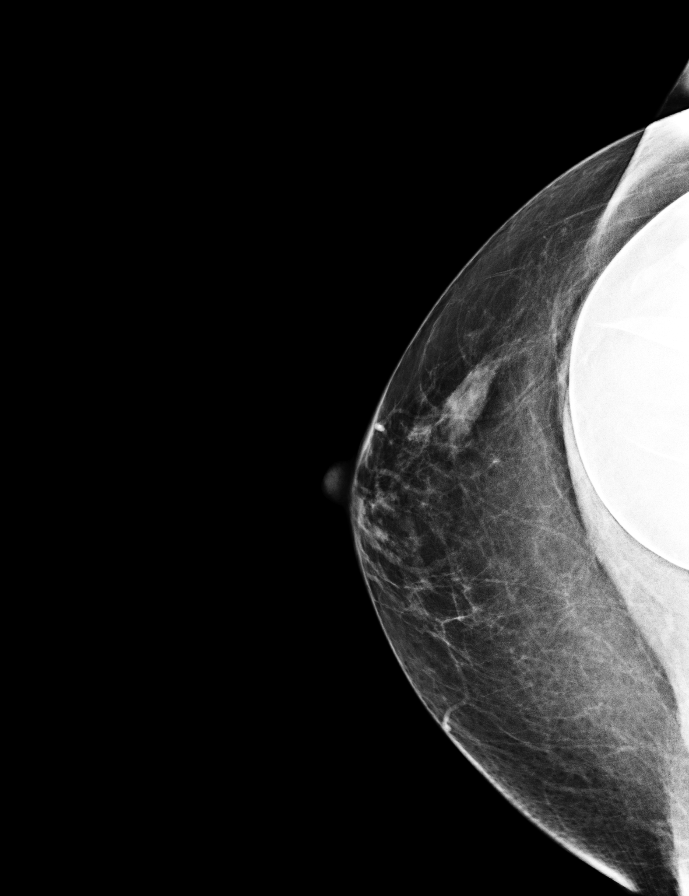

[L MLO (1 of 2)]
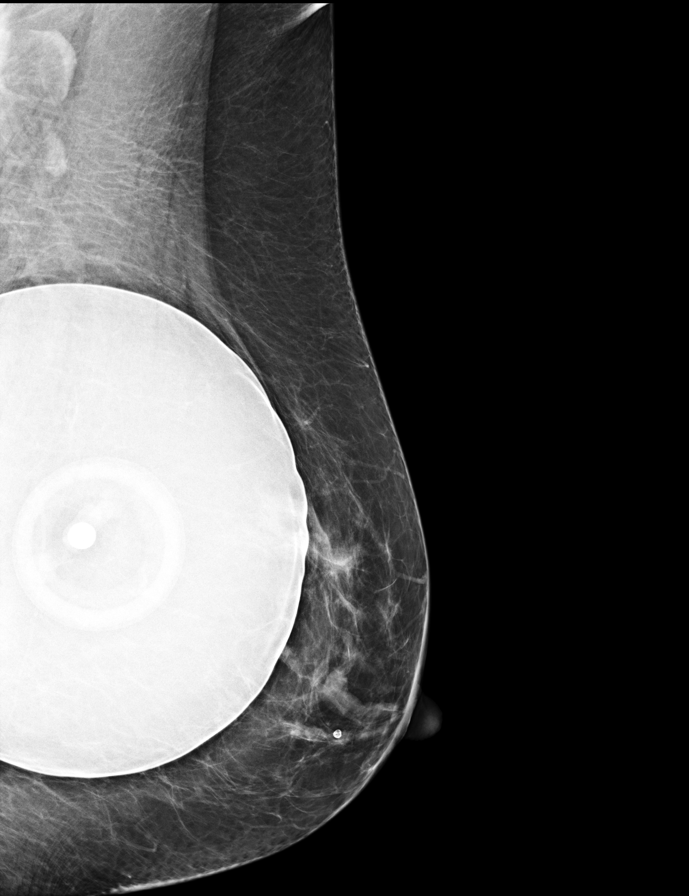

[R XCCM]
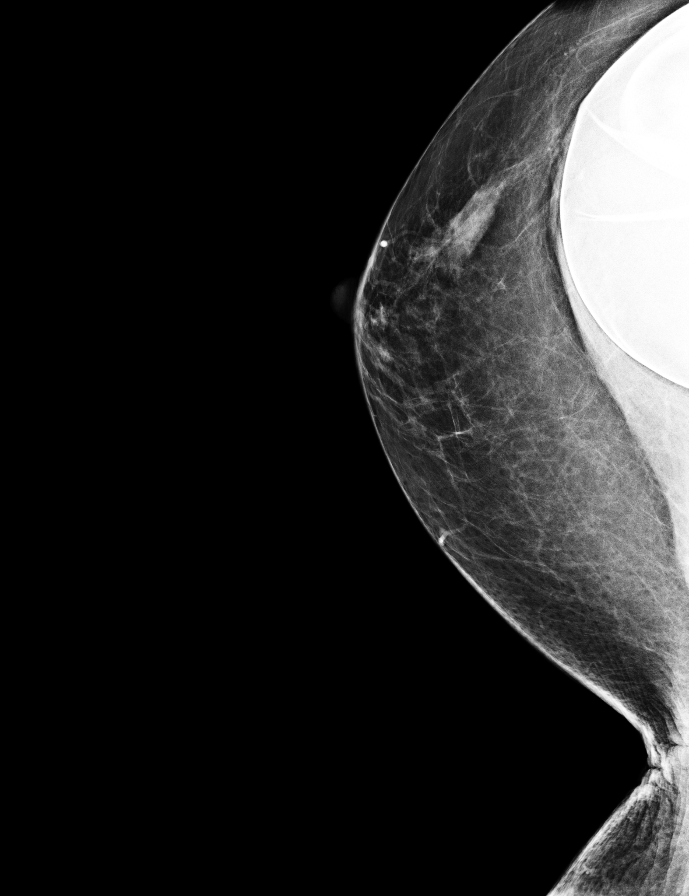

[L CC]
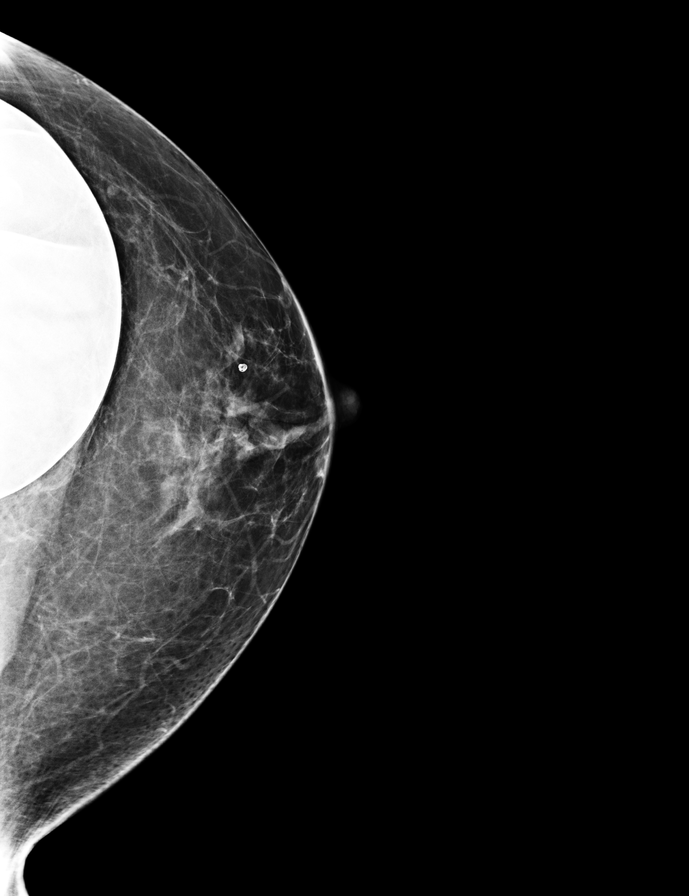

[R MLO]
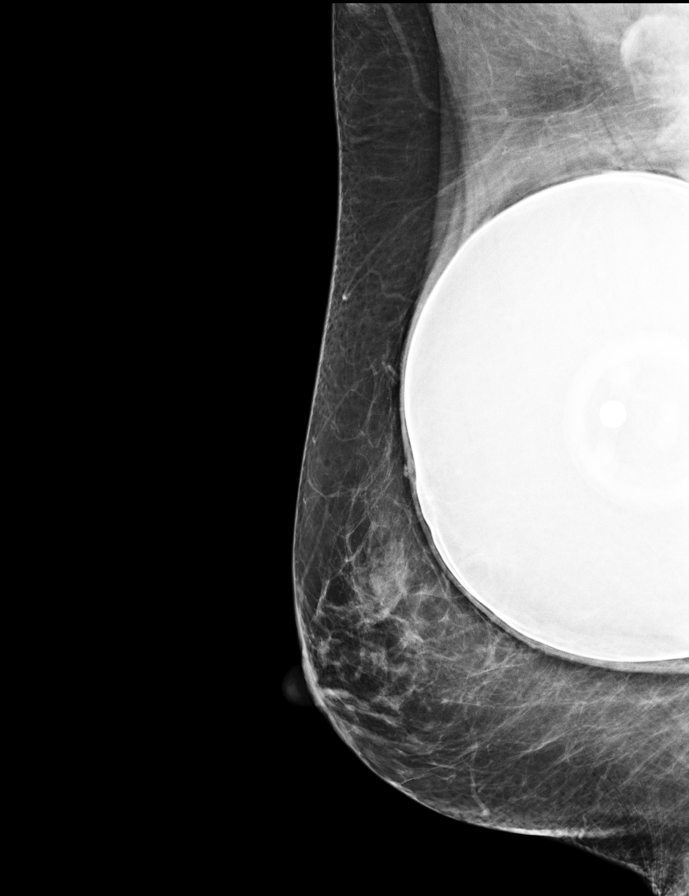

[L MLO (2 of 2)]
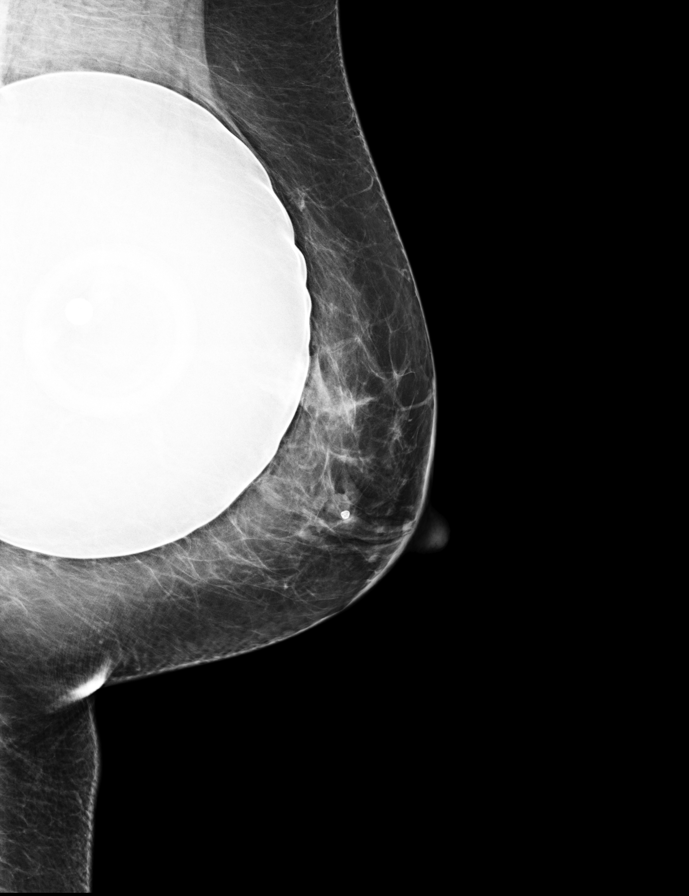

[R CC synth-2D]
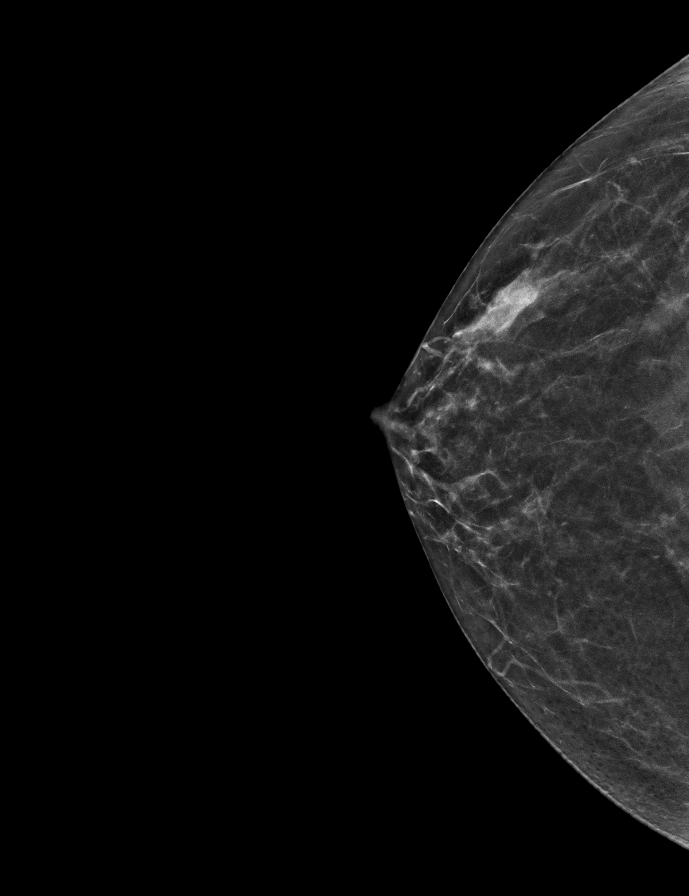

[L CC synth-2D]
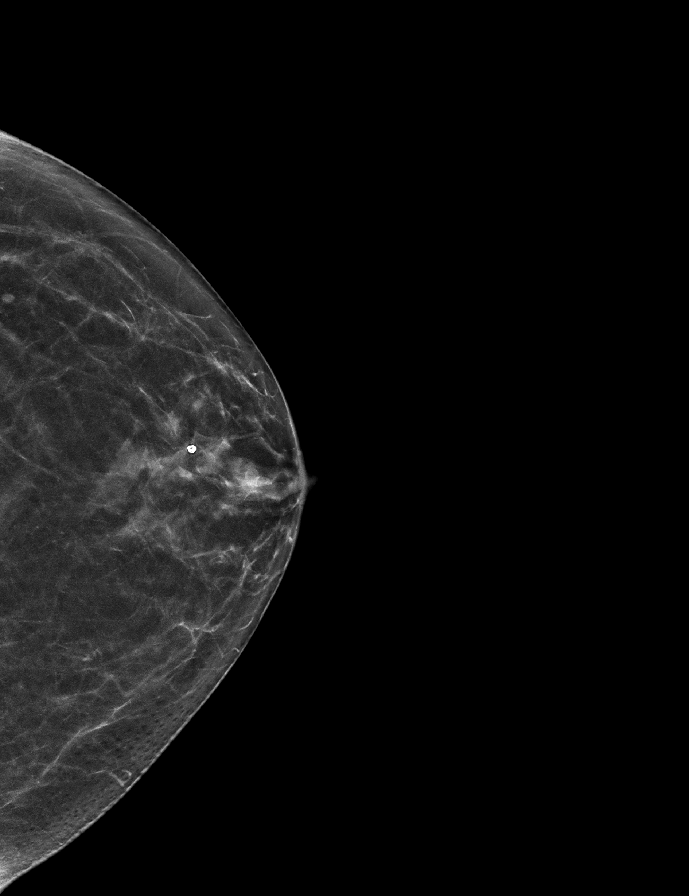

[L MLO synth-2D]
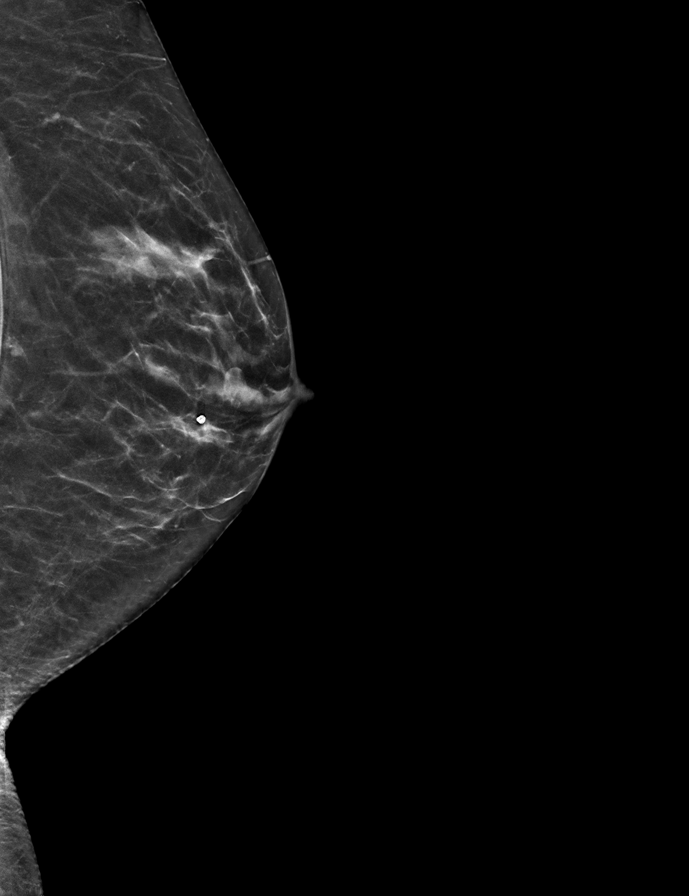

[9 of 36 positions shown; findings below may reference images not displayed]

ACR Breast Density Category b: There are scattered areas of
fibroglandular density.
FINDINGS: The patient has implants. There are no findings suspicious for
malignancy.
IMPRESSION: No mammographic evidence of malignancy. A result letter of this
screening mammogram will be mailed directly to the patient.

RECOMMENDATION:
Screening mammogram in one year. (Code:[id number])

BI-RADS CATEGORY  1:  Negative.

## 2022-05-07 ENCOUNTER — Encounter (INDEPENDENT_AMBULATORY_CARE_PROVIDER_SITE_OTHER): Payer: Self-pay

## 2022-06-04 DIAGNOSIS — J449 Chronic obstructive pulmonary disease, unspecified: Secondary | ICD-10-CM | POA: Diagnosis not present

## 2022-06-04 DIAGNOSIS — Z87892 Personal history of anaphylaxis: Secondary | ICD-10-CM | POA: Diagnosis not present

## 2022-06-04 DIAGNOSIS — H547 Unspecified visual loss: Secondary | ICD-10-CM | POA: Diagnosis not present

## 2022-06-04 DIAGNOSIS — E785 Hyperlipidemia, unspecified: Secondary | ICD-10-CM | POA: Diagnosis not present

## 2022-06-04 DIAGNOSIS — R69 Illness, unspecified: Secondary | ICD-10-CM | POA: Diagnosis not present

## 2022-06-04 DIAGNOSIS — Z85828 Personal history of other malignant neoplasm of skin: Secondary | ICD-10-CM | POA: Diagnosis not present

## 2022-06-04 DIAGNOSIS — Z880 Allergy status to penicillin: Secondary | ICD-10-CM | POA: Diagnosis not present

## 2022-06-04 DIAGNOSIS — Z8249 Family history of ischemic heart disease and other diseases of the circulatory system: Secondary | ICD-10-CM | POA: Diagnosis not present

## 2022-06-21 ENCOUNTER — Other Ambulatory Visit: Payer: Self-pay | Admitting: Nurse Practitioner

## 2022-06-21 NOTE — Telephone Encounter (Signed)
Requested medication (s) are due for refill today: no  Requested medication (s) are on the active medication list: yes  Last refill:  03/19/22 60 each 4 RF  Future visit scheduled: yes  Notes to clinic:  pharmacy requesting a 90 day fill with refills   Requested Prescriptions  Pending Prescriptions Disp Refills   ANORO ELLIPTA 62.5-25 MCG/ACT AEPB [Pharmacy Med Name: ANORO ELLIPTA 62.5-25 MCG INH] 180 each 1    Sig: INHALE [place]     Pulmonology:  Combination Products Passed - 06/21/2022 11:02 AM      Passed - Valid encounter within last 12 months    Recent Outpatient Visits           5 months ago Centrilobular emphysema ([place])   [place] Cannady, Jolene T, NP   7 months ago COPD exacerbation ([place])   [place] Cannady, Jolene T, NP   11 months ago Centrilobular emphysema ([place])   [place], Hickman T, NP   1 year ago Left upper quadrant abdominal pain   [facility] Jon Billings, NP   1 year ago Centrilobular emphysema ([place])   [place], Barbaraann Faster, NP       Future Appointments             In 1 month Cannady, Barbaraann Faster, NP [employer]

## 2022-08-05 NOTE — Patient Instructions (Incomplete)
Eating Plan for Chronic Obstructive Pulmonary Disease Chronic obstructive pulmonary disease (COPD) causes symptoms such as shortness of breath, coughing, and chest discomfort. These symptoms can make it difficult to eat enough to maintain a healthy weight. Generally, people with COPD should eat a diet that is high in calories, protein, and other nutrients to maintain body weight and to keep the lungs as healthy as possible. Depending on the medicines you take and other health conditions you may have, your health care provider may give you additional recommendations on what to eat or avoid. Talk with your health care provider about your goals for body weight, and work with a dietitian to develop an eating plan that is right for you. What are tips for following this plan? Reading food labels  Avoid foods with more than 300 milligrams (mg) of salt (sodium) per serving. Choose foods that contain at least 4 grams (g) of fiber per serving. Try to eat 20-30 g of fiber each day. Choose foods that are high in calories and protein, such as nuts, beans, yogurt, and cheese. Shopping Do not buy foods labeled as diet, low-calorie, or low-fat. If you are able to eat dairy products: Avoid low-fat or skim milk. Buy dairy products that have at least 2% fat. Buy nutritional supplement drinks. Buy grains and prepared foods labeled as enriched or fortified. Consider buying low-sodium, pre-made foods to conserve energy for eating. Cooking Add dry milk or protein powder to smoothies. Cook with healthy fats, such as olive oil, canola oil, sunflower oil, and grapeseed oil. Add oil, butter, cream cheese, or nut butters to foods to increase fat and calories. To make foods easier to chew and swallow: Cook vegetables, pasta, and rice until soft. Cut or grind meat into very small pieces. Dip breads in liquid. Meal planning  Eat when you feel hungry. Eat 5-6 small meals throughout the day. Drink 6-8 glasses of water  each day. Do not drink liquids with meals. Drink liquids at the end of the meal to avoid feeling full too quickly. Eat a variety of fruits and vegetables every day. Ask for assistance from family or friends with planning and preparing meals as needed. Avoid foods that cause you to feel bloated, such as carbonated drinks, fried foods, beans, broccoli, cabbage, and apples. For older adults, ask your local agency on aging whether you are eligible for meal assistance programs, such as Meals on Wheels. Lifestyle  Do not smoke. Eat slowly. Take small bites and chew food well before swallowing. Do not overeat. This may make it more difficult to breathe after eating. Sit up while eating. If needed, continue to use supplemental oxygen while eating. Rest or relax for 30 minutes before and after eating. Monitor your weight as told by your health care provider. Exercise as told by your health care provider. What foods should I eat? Fruits All fresh, dried, canned, or frozen fruits that do not cause gas. Vegetables All fresh, canned (no salt added), or frozen vegetables that do not cause gas. Grains Whole-grain bread. Enriched whole-grain pasta. Fortified whole-grain cereals. Fortified rice. Quinoa. Meats and other proteins Lean meat. Poultry. Fish. Dried beans. Unsalted nuts. Tofu. Eggs. Nut butters. Dairy Whole or 2% milk. Cheese. Yogurt. Fats and oils Olive oil. Canola oil. Butter. Margarine. Beverages Water. Vegetable juice (no salt added). Decaffeinated coffee. Decaffeinated or herbal tea. Seasonings and condiments Fresh or dried herbs. Low-salt or salt-free seasonings. Low-sodium soy sauce. The items listed above may not be a complete list of foods   and beverages you can eat. Contact a dietitian for more information. What foods should I avoid? Fruits Fruits that cause gas, such as apples or melon. Vegetables Vegetables that cause gas, such as broccoli, Brussels sprouts, cabbage,  cauliflower, and onions. Canned vegetables with added salt. Meats and other proteins Fried meat. Salt-cured meat. Processed meat. Dairy Fat-free or low-fat milk, yogurt, or cheese. Processed cheese. Beverages Carbonated drinks. Caffeinated drinks, such as coffee, tea, and soft drinks. Juice. Alcohol. Vegetable juice with added salt. Seasonings and condiments Salt. Seasoning mixes with salt. Soy sauce. Pickles. Other foods Clear soup or broth. Fried foods. Prepared frozen meals. The items listed above may not be a complete list of foods and beverages you should avoid. Contact a dietitian for more information. Summary COPD symptoms can make it difficult to eat enough to maintain a healthy weight. A COPD eating plan can help you maintain your body weight and keep your lungs as healthy as possible. Eat a diet that is high in calories, protein, and other nutrients. Read labels to make sure that you are getting the right nutrients. Cook foods to make them easier to chew and swallow. Eat 5-6 small meals throughout the day, and avoid foods that cause gas or make you feel bloated. This information is not intended to replace advice given to you by your health care provider. Make sure you discuss any questions you have with your health care provider. Document Revised: 05/03/2020 Document Reviewed: 05/03/2020 Elsevier Patient Education  2023 Elsevier Inc.  

## 2022-08-10 ENCOUNTER — Ambulatory Visit: Payer: 59 | Admitting: Nurse Practitioner

## 2022-08-10 ENCOUNTER — Telehealth: Payer: 59 | Admitting: Nurse Practitioner

## 2022-08-10 DIAGNOSIS — J432 Centrilobular emphysema: Secondary | ICD-10-CM

## 2022-08-10 DIAGNOSIS — F17210 Nicotine dependence, cigarettes, uncomplicated: Secondary | ICD-10-CM

## 2022-08-10 DIAGNOSIS — I700 Atherosclerosis of aorta: Secondary | ICD-10-CM

## 2022-08-10 DIAGNOSIS — E782 Mixed hyperlipidemia: Secondary | ICD-10-CM

## 2022-08-19 NOTE — Patient Instructions (Incomplete)
Eating Plan for Chronic Obstructive Pulmonary Disease Chronic obstructive pulmonary disease (COPD) causes symptoms such as shortness of breath, coughing, and chest discomfort. These symptoms can make it difficult to eat enough to maintain a healthy weight. Generally, people with COPD should eat a diet that is high in calories, protein, and other nutrients to maintain body weight and to keep the lungs as healthy as possible. Depending on the medicines you take and other health conditions you may have, your health care provider may give you additional recommendations on what to eat or avoid. Talk with your health care provider about your goals for body weight, and work with a dietitian to develop an eating plan that is right for you. What are tips for following this plan? Reading food labels  Avoid foods with more than 300 milligrams (mg) of salt (sodium) per serving. Choose foods that contain at least 4 grams (g) of fiber per serving. Try to eat 20-30 g of fiber each day. Choose foods that are high in calories and protein, such as nuts, beans, yogurt, and cheese. Shopping Do not buy foods labeled as diet, low-calorie, or low-fat. If you are able to eat dairy products: Avoid low-fat or skim milk. Buy dairy products that have at least 2% fat. Buy nutritional supplement drinks. Buy grains and prepared foods labeled as enriched or fortified. Consider buying low-sodium, pre-made foods to conserve energy for eating. Cooking Add dry milk or protein powder to smoothies. Cook with healthy fats, such as olive oil, canola oil, sunflower oil, and grapeseed oil. Add oil, butter, cream cheese, or nut butters to foods to increase fat and calories. To make foods easier to chew and swallow: Cook vegetables, pasta, and rice until soft. Cut or grind meat into very small pieces. Dip breads in liquid. Meal planning  Eat when you feel hungry. Eat 5-6 small meals throughout the day. Drink 6-8 glasses of water  each day. Do not drink liquids with meals. Drink liquids at the end of the meal to avoid feeling full too quickly. Eat a variety of fruits and vegetables every day. Ask for assistance from family or friends with planning and preparing meals as needed. Avoid foods that cause you to feel bloated, such as carbonated drinks, fried foods, beans, broccoli, cabbage, and apples. For older adults, ask your local agency on aging whether you are eligible for meal assistance programs, such as Meals on Wheels. Lifestyle  Do not smoke. Eat slowly. Take small bites and chew food well before swallowing. Do not overeat. This may make it more difficult to breathe after eating. Sit up while eating. If needed, continue to use supplemental oxygen while eating. Rest or relax for 30 minutes before and after eating. Monitor your weight as told by your health care provider. Exercise as told by your health care provider. What foods should I eat? Fruits All fresh, dried, canned, or frozen fruits that do not cause gas. Vegetables All fresh, canned (no salt added), or frozen vegetables that do not cause gas. Grains Whole-grain bread. Enriched whole-grain pasta. Fortified whole-grain cereals. Fortified rice. Quinoa. Meats and other proteins Lean meat. Poultry. Fish. Dried beans. Unsalted nuts. Tofu. Eggs. Nut butters. Dairy Whole or 2% milk. Cheese. Yogurt. Fats and oils Olive oil. Canola oil. Butter. Margarine. Beverages Water. Vegetable juice (no salt added). Decaffeinated coffee. Decaffeinated or herbal tea. Seasonings and condiments Fresh or dried herbs. Low-salt or salt-free seasonings. Low-sodium soy sauce. The items listed above may not be a complete list of foods  and beverages you can eat. Contact a dietitian for more information. What foods should I avoid? Fruits Fruits that cause gas, such as apples or melon. Vegetables Vegetables that cause gas, such as broccoli, Brussels sprouts, cabbage,  cauliflower, and onions. Canned vegetables with added salt. Meats and other proteins Fried meat. Salt-cured meat. Processed meat. Dairy Fat-free or low-fat milk, yogurt, or cheese. Processed cheese. Beverages Carbonated drinks. Caffeinated drinks, such as coffee, tea, and soft drinks. Juice. Alcohol. Vegetable juice with added salt. Seasonings and condiments Salt. Seasoning mixes with salt. Soy sauce. Angie Fava. Other foods Clear soup or broth. Fried foods. Prepared frozen meals. The items listed above may not be a complete list of foods and beverages you should avoid. Contact a dietitian for more information. Summary COPD symptoms can make it difficult to eat enough to maintain a healthy weight. A COPD eating plan can help you maintain your body weight and keep your lungs as healthy as possible. Eat a diet that is high in calories, protein, and other nutrients. Read labels to make sure that you are getting the right nutrients. Cook foods to make them easier to chew and swallow. Eat 5-6 small meals throughout the day, and avoid foods that cause gas or make you feel bloated. This information is not intended to replace advice given to you by your health care provider. Make sure you discuss any questions you have with your health care provider. Document Revised: 05/03/2020 Document Reviewed: 05/03/2020 Elsevier Patient Education  [place].

## 2022-08-20 ENCOUNTER — Telehealth: Payer: 59 | Admitting: Nurse Practitioner

## 2022-08-22 ENCOUNTER — Ambulatory Visit: Payer: 59 | Admitting: Nurse Practitioner

## 2022-08-22 ENCOUNTER — Encounter: Payer: Self-pay | Admitting: Nurse Practitioner

## 2022-08-22 VITALS — BP 118/82 | HR 77 | Temp 98.1°F | Ht 64.76 in | Wt 186.5 lb

## 2022-08-22 DIAGNOSIS — R69 Illness, unspecified: Secondary | ICD-10-CM | POA: Diagnosis not present

## 2022-08-22 DIAGNOSIS — R0989 Other specified symptoms and signs involving the circulatory and respiratory systems: Secondary | ICD-10-CM

## 2022-08-22 DIAGNOSIS — I700 Atherosclerosis of aorta: Secondary | ICD-10-CM | POA: Diagnosis not present

## 2022-08-22 DIAGNOSIS — J432 Centrilobular emphysema: Secondary | ICD-10-CM | POA: Diagnosis not present

## 2022-08-22 DIAGNOSIS — E782 Mixed hyperlipidemia: Secondary | ICD-10-CM | POA: Diagnosis not present

## 2022-08-22 DIAGNOSIS — F17210 Nicotine dependence, cigarettes, uncomplicated: Secondary | ICD-10-CM

## 2022-08-22 NOTE — Assessment & Plan Note (Signed)
Chronic, ongoing, noted on screening.  Recommend complete cessation of smoking. Continue Anoro daily and Albuterol PRN.  Continue annual lung screening.

## 2022-08-22 NOTE — Progress Notes (Signed)
BP 118/82 (BP Location: Left Arm, Patient Position: Sitting, Cuff Size: Normal)   Pulse 77   Temp 98.1 F (36.7 C) (Oral)   Ht 5' 4.76" (1.645 m)   Wt 186 lb 8 oz (84.6 kg)   LMP 09/07/2010 (Approximate)   SpO2 97%   BMI 31.26 kg/m    Subjective:    Patient ID: Aimee Lawson, female    DOB: [birth year], 62 y.o.   MRN: [id number]  HPI: Aimee Lawson is a 62 y.o. female  Chief Complaint  Patient presents with   Hyperlipidemia   COPD   COPD Low dose lung screening noted centrilobular and paraseptal emphysema + aortic atherosclerosis, last on 03/06/22.  Continues Anoro daily, uses Albuterol as needed.  Started with URI symptoms Sunday morning -- Covid negative at home.    Does smoke -- averages about 1 PPD, has smoked since she was 15. Has tried various medications for cessation in past without benefit. COPD status: stable Satisfied with current treatment?: no Oxygen use: no Dyspnea frequency: no Cough frequency: occasional Rescue inhaler frequency: no, has not used all year Limitation of activity: no Productive cough: no Last Spirometry: 08/22/22 FEV1 69% and FEV1/FVC 65% Pneumovax: Up To Date Influenza: Up to Date    HYPERLIPIDEMIA Continues on Atorvastatin.   Hyperlipidemia status: good compliance Satisfied with current treatment?  yes Side effects:  no Medication compliance: good compliance Supplements: none Aspirin:  yes The 10-year ASCVD risk score (Arnett DK, et al., 2019) is: 6.4%   Values used to calculate the score:     Age: 67 years     Sex: Female     Is Non-Hispanic African American: No     Diabetic: No     Tobacco smoker: Yes     Systolic Blood Pressure: 123456 mmHg     Is BP treated: No     HDL Cholesterol: 77 mg/dL     Total Cholesterol: 207 mg/dL Chest pain:  no Coronary artery disease:  no Family history CAD: yes Family history early CAD:  yes  Relevant past medical, surgical, family and social history reviewed and updated as indicated.  Interim medical history since our last visit reviewed. Allergies and medications reviewed and updated.  Review of Systems  Constitutional:  Negative for activity change, appetite change, diaphoresis, fatigue and fever.  Respiratory:  Negative for cough, chest tightness, shortness of breath and wheezing.   Cardiovascular:  Negative for chest pain, palpitations and leg swelling.  Gastrointestinal: Negative.   Neurological: Negative.   Psychiatric/Behavioral: Negative.      Per HPI unless specifically indicated above     Objective:    BP 118/82 (BP Location: Left Arm, Patient Position: Sitting, Cuff Size: Normal)   Pulse 77   Temp 98.1 F (36.7 C) (Oral)   Ht 5' 4.76" (1.645 m)   Wt 186 lb 8 oz (84.6 kg)   LMP 09/07/2010 (Approximate)   SpO2 97%   BMI 31.26 kg/m   Wt Readings from Last 3 Encounters:  08/22/22 186 lb 8 oz (84.6 kg)  03/14/22 179 lb 12.8 oz (81.6 kg)  01/22/22 177 lb 6.4 oz (80.5 kg)    Physical Exam Vitals and nursing note reviewed.  Constitutional:      General: She is awake. She is not in acute distress.    Appearance: She is well-developed, well-groomed and overweight. She is not ill-appearing.  HENT:     Head: Normocephalic.     Right Ear: Hearing normal.  Left Ear: Hearing normal.  Eyes:     General: Lids are normal.        Right eye: No discharge.        Left eye: No discharge.     Conjunctiva/sclera: Conjunctivae normal.     Pupils: Pupils are equal, round, and reactive to light.  Neck:     Thyroid: No thyromegaly.     Vascular: Carotid bruit present.  Cardiovascular:     Rate and Rhythm: Normal rate and regular rhythm.     Heart sounds: Normal heart sounds. No murmur heard.    No gallop.  Pulmonary:     Effort: Pulmonary effort is normal. No accessory muscle usage or respiratory distress.     Breath sounds: Normal breath sounds.  Abdominal:     General: Bowel sounds are normal.     Palpations: Abdomen is soft. There is no hepatomegaly  or splenomegaly.  Musculoskeletal:     Cervical back: Normal range of motion and neck supple.     Right lower leg: No edema.     Left lower leg: No edema.  Lymphadenopathy:     Cervical: No cervical adenopathy.  Skin:    General: Skin is warm and dry.  Neurological:     Mental Status: She is alert and oriented to person, place, and time.  Psychiatric:        Attention and Perception: Attention normal.        Mood and Affect: Mood normal.        Speech: Speech normal.        Behavior: Behavior normal. Behavior is cooperative.        Thought Content: Thought content normal.    Results for orders placed or performed in visit on 01/22/22  CBC with Differential/Platelet  Result Value Ref Range   WBC 6.3 3.4 - 10.8 x10E3/uL   RBC 4.18 3.77 - 5.28 x10E6/uL   Hemoglobin 13.3 11.1 - 15.9 g/dL   Hematocrit 39.7 34.0 - 46.6 %   MCV 95 79 - 97 fL   MCH 31.8 26.6 - 33.0 pg   MCHC 33.5 31.5 - 35.7 g/dL   RDW 12.4 11.7 - 15.4 %   Platelets 228 150 - 450 x10E3/uL   Neutrophils 52 Not Estab. %   Lymphs 38 Not Estab. %   Monocytes 8 Not Estab. %   Eos 1 Not Estab. %   Basos 1 Not Estab. %   Neutrophils Absolute 3.3 1.4 - 7.0 x10E3/uL   Lymphocytes Absolute 2.4 0.7 - 3.1 x10E3/uL   Monocytes Absolute 0.5 0.1 - 0.9 x10E3/uL   EOS (ABSOLUTE) 0.1 0.0 - 0.4 x10E3/uL   Basophils Absolute 0.0 0.0 - 0.2 x10E3/uL   Immature Granulocytes 0 Not Estab. %   Immature Grans (Abs) 0.0 0.0 - 0.1 x10E3/uL  Comprehensive metabolic panel  Result Value Ref Range   Glucose 60 (L) 70 - 99 mg/dL   BUN 15 8 - 27 mg/dL   Creatinine, Ser 1.17 (H) 0.57 - 1.00 mg/dL   eGFR 52 (L) >59 mL/min/1.73   BUN/Creatinine Ratio 13 12 - 28   Sodium 142 134 - 144 mmol/L   Potassium 4.0 3.5 - 5.2 mmol/L   Chloride 104 96 - 106 mmol/L   CO2 23 20 - 29 mmol/L   Calcium 9.4 8.7 - 10.3 mg/dL   Total Protein 6.7 6.0 - 8.5 g/dL   Albumin 4.5 3.9 - 4.9 g/dL   Globulin, Total 2.2 1.5 - 4.5 g/dL  Albumin/Globulin Ratio 2.0  1.2 - 2.2   Bilirubin Total 0.4 0.0 - 1.2 mg/dL   Alkaline Phosphatase 91 44 - 121 IU/L   AST 16 0 - 40 IU/L   ALT 11 0 - 32 IU/L  Lipid Panel w/o Chol/HDL Ratio  Result Value Ref Range   Cholesterol, Total 207 (H) 100 - 199 mg/dL   Triglycerides 109 0 - 149 mg/dL   HDL 77 >39 mg/dL   VLDL Cholesterol Cal 19 5 - 40 mg/dL   LDL Chol Calc (NIH) 111 (H) 0 - 99 mg/dL  TSH  Result Value Ref Range   TSH 2.150 0.450 - 4.500 uIU/mL  Vitamin B12  Result Value Ref Range   Vitamin B-12 301 232 - 1,245 pg/mL      Assessment & Plan:   Problem List Items Addressed This Visit       Cardiovascular and Mediastinum   Aortic atherosclerosis (HCC)    Chronic.  Noted on lung CT screening.  Continue statin and ASA daily for prevention purposes.  Recommend complete cessation of smoking.      Relevant Orders   Comprehensive metabolic panel   Lipid Panel w/o Chol/HDL Ratio     Respiratory   Centrilobular emphysema (HCC) - Primary    Chronic, ongoing, noted on screening.  Recommend complete cessation of smoking. Continue Anoro daily and Albuterol PRN.  Continue annual lung screening.      Relevant Orders   Spirometry with Graph (Completed)     Other   Bilateral carotid bruits    <50% stenosis on imaging.  Follow-up with vascular PRN or is symptomatic, plan to recheck imaging in 5-6 years.  Recommend complete cessation of smoking.      Relevant Orders   Comprehensive metabolic panel   Lipid Panel w/o Chol/HDL Ratio   Cigarette nicotine dependence without complication    I have recommended complete cessation of tobacco use. I have discussed various options available for assistance with tobacco cessation including over the counter methods (Nicotine gum, patch and lozenges). We also discussed prescription options (Chantix, Nicotine Inhaler / Nasal Spray). The patient is not interested in pursuing any prescription tobacco cessation options at this time.       Hyperlipemia    Ongoing and  stable.  Continue Atorvastatin 10 MG daily.  Check lipid panel and CMP today.  Recommend diet changes and modest loss.       Relevant Orders   Comprehensive metabolic panel   Lipid Panel w/o Chol/HDL Ratio     Follow up plan: Return in about 6 months (around 02/20/2023) for Annual Physical.

## 2022-08-22 NOTE — Assessment & Plan Note (Signed)
<  50% stenosis on imaging.  Follow-up with vascular PRN or is symptomatic, plan to recheck imaging in 5-6 years.  Recommend complete cessation of smoking.

## 2022-08-22 NOTE — Assessment & Plan Note (Signed)
I have recommended complete cessation of tobacco use. I have discussed various options available for assistance with tobacco cessation including over the counter methods (Nicotine gum, patch and lozenges). We also discussed prescription options (Chantix, Nicotine Inhaler / Nasal Spray). The patient is not interested in pursuing any prescription tobacco cessation options at this time.  

## 2022-08-22 NOTE — Assessment & Plan Note (Addendum)
Chronic.  Noted on lung CT screening.  Continue statin and ASA daily for prevention purposes.  Recommend complete cessation of smoking.

## 2022-08-22 NOTE — Assessment & Plan Note (Signed)
Ongoing and stable.  Continue Atorvastatin 10 MG daily.  Check lipid panel and CMP today.  Recommend diet changes and modest loss.

## 2022-08-23 ENCOUNTER — Encounter: Payer: Self-pay | Admitting: Nurse Practitioner

## 2022-08-23 LAB — COMPREHENSIVE METABOLIC PANEL
ALT: 16 IU/L (ref 0–32)
AST: 15 IU/L (ref 0–40)
Albumin/Globulin Ratio: 2.3 — ABNORMAL HIGH (ref 1.2–2.2)
Albumin: 4.3 g/dL (ref 3.9–4.9)
Alkaline Phosphatase: 97 IU/L (ref 44–121)
BUN/Creatinine Ratio: 15 (ref 12–28)
BUN: 15 mg/dL (ref 8–27)
Bilirubin Total: 0.4 mg/dL (ref 0.0–1.2)
CO2: 23 mmol/L (ref 20–29)
Calcium: 9 mg/dL (ref 8.7–10.3)
Chloride: 105 mmol/L (ref 96–106)
Creatinine, Ser: 0.98 mg/dL (ref 0.57–1.00)
Globulin, Total: 1.9 g/dL (ref 1.5–4.5)
Glucose: 102 mg/dL — ABNORMAL HIGH (ref 70–99)
Potassium: 3.8 mmol/L (ref 3.5–5.2)
Sodium: 143 mmol/L (ref 134–144)
Total Protein: 6.2 g/dL (ref 6.0–8.5)
eGFR: 65 mL/min/{1.73_m2} (ref >59)

## 2022-08-23 LAB — LIPID PANEL W/O CHOL/HDL RATIO
Cholesterol, Total: 141 mg/dL (ref 100–199)
HDL: 67 mg/dL (ref >39)
LDL Chol Calc (NIH): 55 mg/dL (ref 0–99)
Triglycerides: 108 mg/dL (ref 0–149)
VLDL Cholesterol Cal: 19 mg/dL (ref 5–40)

## 2022-08-23 MED ORDER — WEGOVY 0.25 MG/0.5ML ~~LOC~~ SOAJ: 0.2500 mg | SUBCUTANEOUS | 2 refills | Status: DC

## 2022-08-23 NOTE — Progress Notes (Signed)
Contacted via [place] evening Aimee Lawson, your labs have returned: - Kidney function, creatinine and eGFR, remains normal, as is liver function, AST and ALT.  - Cholesterol levels look great!!  Continue all current medications.  No changes needed.  Any questions? Keep being amazing!!  Thank you for allowing me to participate in your care.  I appreciate you. Kindest regards, Omara Alcon

## 2022-09-26 DIAGNOSIS — Z86018 Personal history of other benign neoplasm: Secondary | ICD-10-CM | POA: Diagnosis not present

## 2022-09-26 DIAGNOSIS — L578 Other skin changes due to chronic exposure to nonionizing radiation: Secondary | ICD-10-CM | POA: Diagnosis not present

## 2022-09-26 DIAGNOSIS — L570 Actinic keratosis: Secondary | ICD-10-CM | POA: Diagnosis not present

## 2022-09-26 DIAGNOSIS — Z859 Personal history of malignant neoplasm, unspecified: Secondary | ICD-10-CM | POA: Diagnosis not present

## 2022-09-26 DIAGNOSIS — Z872 Personal history of diseases of the skin and subcutaneous tissue: Secondary | ICD-10-CM | POA: Diagnosis not present

## 2022-11-27 ENCOUNTER — Encounter: Payer: Self-pay | Admitting: Nurse Practitioner

## 2022-11-27 DIAGNOSIS — K8020 Calculus of gallbladder without cholecystitis without obstruction: Secondary | ICD-10-CM

## 2022-12-12 ENCOUNTER — Encounter: Payer: Self-pay | Admitting: Surgery

## 2022-12-12 ENCOUNTER — Ambulatory Visit: Payer: 59 | Admitting: Surgery

## 2022-12-12 VITALS — BP 117/74 | HR 98 | Temp 98.0°F | Ht 64.0 in | Wt 180.0 lb

## 2022-12-12 DIAGNOSIS — K8020 Calculus of gallbladder without cholecystitis without obstruction: Secondary | ICD-10-CM | POA: Diagnosis not present

## 2022-12-12 NOTE — Progress Notes (Signed)
12/12/2022  Reason for Visit: Symptomatic cholelithiasis  Requesting Provider: Aura Dials, NP  History of Present Illness: Aimee Lawson is a 62 y.o. female presenting for evaluation of symptomatic cholelithiasis.  Patient has a known history of gallstones as this had been noticed on CT of her chest for lung cancer screening purposes as early as 3 years ago.  The patient initially had not been having any issues but more recently she has been developing more bloatedness and discomfort in the epigastric area.  This pain can radiate towards her back.  Has not had any nausea or vomiting.  She is also noted intermittent times where she has had yellowing of the sclera.  However, lab work has never shown any elevated LFTs.  Her overall discomfort in the epigastric area has been happening more frequently.  Her CT scans have shown that her gallbladder is full of gallstones.  I have seen the images and agree with the findings.  Given the increased frequency and severity of symptoms, she will like to be evaluated for cholecystectomy.  She has not had any prior intra-abdominal surgeries.  She does a lot of traveling and would not like to have a severe episode of biliary colic or cholecystitis while away out of town.  Past Medical History: Past Medical History:  Diagnosis Date   Atypical mole 11/04/2013   Right superior lateral pubic   No pertinent past medical history    Squamous cell carcinoma of skin 10/31/2017   right pretibial      Past Surgical History: Past Surgical History:  Procedure Laterality Date   AUGMENTATION MAMMAPLASTY     BREAST ENHANCEMENT SURGERY     20+ yrs ago   BREAST SURGERY     COLONOSCOPY N/A 02/21/2021   Procedure: COLONOSCOPY;  Surgeon: Wyline Mood, MD;  Location: [facility];  Service: Gastroenterology;  Laterality: N/A;   FACIAL COSMETIC SURGERY      Home Medications: Prior to Admission medications   Medication Sig Start Date End Date Taking? Authorizing  Provider  albuterol (VENTOLIN HFA) 108 (90 Base) MCG/ACT inhaler Inhale 2 puffs into the lungs every 6 (six) hours as needed for wheezing or shortness of breath. 11/22/21  Yes Cannady, Jolene T, NP  atorvastatin (LIPITOR) 20 MG tablet Take 1 tablet (20 mg total) by mouth daily. 03/19/22  Yes Cannady, Jolene T, NP  umeclidinium-vilanterol (ANORO ELLIPTA) 62.5-25 MCG/ACT AEPB INHALE 1 PUFF BY MOUTH EVERY DAY 06/22/22  Yes Cannady, Dorie Rank, NP    Allergies: Allergies  Allergen Reactions   Penicillins Anaphylaxis    Social History:  reports that she has been smoking cigarettes. She has a 22.00 pack-year smoking history. She has been exposed to tobacco smoke. She has never used smokeless tobacco. She reports current alcohol use. She reports that she does not use drugs.   Family History: Family History  Problem Relation Age of Onset   Heart attack Mother    Stroke Mother    Uterine cancer Mother    Colon cancer Mother    Heart attack Father    Hypertension Brother    Other Brother        Pacemaker   Emphysema Brother    Diabetes Maternal Grandmother    Heart disease Maternal Grandmother    Heart disease Maternal Grandfather    Heart disease Paternal Grandmother    Heart disease Paternal Grandfather     Review of Systems: Review of Systems  Constitutional:  Negative for chills and fever.  HENT:  Negative for hearing loss.   Respiratory:  Negative for shortness of breath.   Cardiovascular:  Negative for chest pain.  Gastrointestinal:  Positive for abdominal pain. Negative for nausea and vomiting.  Genitourinary:  Negative for dysuria.  Musculoskeletal:  Positive for back pain. Negative for myalgias.  Skin:        Has had prior episodes of jaundice.  Neurological:  Negative for dizziness.  Psychiatric/Behavioral:  Negative for depression.     Physical Exam BP 117/74   Pulse 98   Temp 98 F (36.7 C)   Ht 5\' 4"  (1.626 m)   Wt 180 lb (81.6 kg)   LMP 09/07/2010 (Approximate)    SpO2 98%   BMI 30.90 kg/m  CONSTITUTIONAL: No acute distress, well-nourished HEENT:  Normocephalic, atraumatic, extraocular motion intact. NECK: Trachea is midline, and there is no jugular venous distension.  RESPIRATORY:  Lungs are clear, and breath sounds are equal bilaterally. Normal respiratory effort without pathologic use of accessory muscles. CARDIOVASCULAR: Heart is regular without murmurs, gallops, or rubs. GI: The abdomen is soft, nondistended, with some mild discomfort to palpation in the epigastric area.  Negative Murphy's sign.  MUSCULOSKELETAL:  Normal muscle strength and tone in all four extremities.  No peripheral edema or cyanosis. SKIN: Skin turgor is normal.  Currently no jaundice.  NEUROLOGIC:  Motor and sensation is grossly normal.  Cranial nerves are grossly intact. PSYCH:  Alert and oriented to person, place and time. Affect is normal.  Laboratory Analysis: Labs from 08/22/2022: Sodium 143, potassium 3.8, chloride 105, CO2 23, BUN 15, creatinine 0.98.  Total bilirubin 0.4, AST 15, ALT 16, alkaline phosphatase 97, albumin 4.3.  Imaging: CT chest lung cancer screening on 03/06/2022: IMPRESSION: 1. Lung-RADS 2, benign appearance or behavior. Continue annual screening with low-dose chest CT without contrast in 12 months. 2. Cholelithiasis. 3.  Aortic atherosclerosis (ICD10-I70.0). 4.  Emphysema (ICD10-J43.9).  Assessment and Plan: This is a 62 y.o. female with symptomatic cholelithiasis.  - Discussed with the patient that as her gallbladder is becoming more full stones, now any small meals may be able to trigger her gallbladder episodes with biliary colic.  She has not had any episodes of severe pain to suggest cholecystitis, but she has noticed some yellowing of her sclera and she may be having intermittent choledocholithiasis.  She does not report having worse symptoms during those times and so the stones may not be truly getting impacted and may be passing  normally.  Given this, though I do agree that proceeding with cholecystectomy is the prudent thing to do. - Discussed with her then the plan for robotic assisted cholecystectomy and reviewed the surgery at length with her including the planned incisions, the risks of bleeding, infection, injury to surrounding structures, the use of ICG to better evaluate the biliary anatomy, that this would be an outpatient procedure, the possibility of an intraoperative cholangiogram, postoperative activity restrictions, pain control, and she is willing to proceed. - The patient reports that she is having stripping of varicose veins next month and would like to wait for surgery in July if possible.  I think that is reasonable but discussed with her return precautions that would be very strict given her symptoms.  We will schedule her for surgery on 01/31/2023.  Patient will follow-up with me next month for H&P update.  I spent 55 minutes dedicated to the care of this patient on the date of this encounter to include pre-visit review of records, face-to-face time with the patient  discussing diagnosis and management, and any post-visit coordination of care.   Howie Ill, MD [facility]

## 2022-12-12 NOTE — Patient Instructions (Signed)
You have requested to have your gallbladder removed. This will be done at [facility] with Dr. Piscoya.  You will most likely be out of work 1-2 weeks for this surgery.  If you have FMLA or disability paperwork that needs filled out you may drop this off at our office or this can be faxed to [phone].  You will return after your post-op appointment with a lifting restriction for approximately 4 more weeks.  You will be able to eat anything you would like to following surgery. But, start by eating a bland diet and advance this as tolerated. The Gallbladder diet is below, please go as closely by this diet as possible prior to surgery to avoid any further attacks.  Please see the (blue)pre-care form that you have been given today. Our surgery scheduler will call you to verify surgery date and to go over information.   If you have any questions, please call our office.  Laparoscopic Cholecystectomy Laparoscopic cholecystectomy is surgery to remove the gallbladder. The gallbladder is located in the upper right part of the abdomen, behind the liver. It is a storage sac for bile, which is produced in the liver. Bile aids in the digestion and absorption of fats. Cholecystectomy is often done for inflammation of the gallbladder (cholecystitis). This condition is usually caused by a buildup of gallstones (cholelithiasis) in the gallbladder. Gallstones can block the flow of bile, and that can result in inflammation and pain. In severe cases, emergency surgery may be required. If emergency surgery is not required, you will have time to prepare for the procedure. Laparoscopic surgery is an alternative to open surgery. Laparoscopic surgery has a shorter recovery time. Your common bile duct may also need to be examined during the procedure. If stones are found in the common bile duct, they may be removed. LET YOUR HEALTH CARE PROVIDER KNOW ABOUT: Any allergies you have. All medicines you are taking,  including vitamins, herbs, eye drops, creams, and over-the-counter medicines. Previous problems you or members of your family have had with the use of anesthetics. Any blood disorders you have. Previous surgeries you have had.  Any medical conditions you have. RISKS AND COMPLICATIONS Generally, this is a safe procedure. However, problems may occur, including: Infection. Bleeding. Allergic reactions to medicines. Damage to other structures or organs. A stone remaining in the common bile duct. A bile leak from the cyst duct that is clipped when your gallbladder is removed. The need to convert to open surgery, which requires a larger incision in the abdomen. This may be necessary if your surgeon thinks that it is not safe to continue with a laparoscopic procedure. BEFORE THE PROCEDURE Ask your health care provider about: Changing or stopping your regular medicines. This is especially important if you are taking diabetes medicines or blood thinners. Taking medicines such as aspirin and ibuprofen. These medicines can thin your blood. Do not take these medicines before your procedure if your health care provider instructs you not to. Follow instructions from your health care provider about eating or drinking restrictions. Let your health care provider know if you develop a cold or an infection before surgery. Plan to have someone take you home after the procedure. Ask your health care provider how your surgical site will be marked or identified. You may be given antibiotic medicine to help prevent infection. PROCEDURE To reduce your risk of infection: Your health care team will wash or sanitize their hands. Your skin will be washed with soap. An IV   tube may be inserted into one of your veins. You will be given a medicine to make you fall asleep (general anesthetic). A breathing tube will be placed in your mouth. The surgeon will make several small cuts (incisions) in your abdomen. A thin,  lighted tube (laparoscope) that has a tiny camera on the end will be inserted through one of the small incisions. The camera on the laparoscope will send a picture to a TV screen (monitor) in the operating room. This will give the surgeon a good view inside your abdomen. A gas will be pumped into your abdomen. This will expand your abdomen to give the surgeon more room to perform the surgery. Other tools that are needed for the procedure will be inserted through the other incisions. The gallbladder will be removed through one of the incisions. After your gallbladder has been removed, the incisions will be closed with stitches (sutures), staples, or skin glue. Your incisions may be covered with a bandage (dressing). The procedure may vary among health care providers and hospitals. AFTER THE PROCEDURE Your blood pressure, heart rate, breathing rate, and blood oxygen level will be monitored often until the medicines you were given have worn off. You will be given medicines as needed to control your pain.   This information is not intended to replace advice given to you by your health care provider. Make sure you discuss any questions you have with your health care provider.   Document Released: 06/25/2005 Document Revised: 03/16/2015 Document Reviewed: 02/04/2013 Elsevier Interactive Patient Education 2016 [employer]   Low-Fat Diet for Gallbladder Conditions A low-fat diet can be helpful if you have pancreatitis or a gallbladder condition. With these conditions, your pancreas and gallbladder have trouble digesting fats. A healthy eating plan with less fat will help rest your pancreas and gallbladder and reduce your symptoms. WHAT DO I NEED TO KNOW ABOUT THIS DIET? Eat a low-fat diet. Reduce your fat intake to less than 20-30% of your total daily calories. This is less than 50-60 g of fat per day. Remember that you need some fat in your diet. Ask your dietician what your daily goal should  be. Choose nonfat and low-fat healthy foods. Look for the words "nonfat," "low fat," or "fat free." As a guide, look on the label and choose foods with less than 3 g of fat per serving. Eat only one serving. Avoid alcohol. Do not smoke. If you need help quitting, talk with your health care provider. Eat small frequent meals instead of three large heavy meals. WHAT FOODS CAN I EAT? Grains Include healthy grains and starches such as potatoes, wheat bread, fiber-rich cereal, and brown rice. Choose whole grain options whenever possible. In adults, whole grains should account for 45-65% of your daily calories.  Fruits and Vegetables Eat plenty of fruits and vegetables. Fresh fruits and vegetables add fiber to your diet. Meats and Other Protein Sources Eat lean meat such as chicken and pork. Trim any fat off of meat before cooking it. Eggs, fish, and beans are other sources of protein. In adults, these foods should account for 10-35% of your daily calories. Dairy Choose low-fat milk and dairy options. Dairy includes fat and protein, as well as calcium.  Fats and Oils Limit high-fat foods such as fried foods, sweets, baked goods, sugary drinks.  Other Creamy sauces and condiments, such as mayonnaise, can add extra fat. Think about whether or not you need to use them, or use smaller amounts or low fat options.   WHAT FOODS ARE NOT RECOMMENDED? High fat foods, such as: Baked goods. Ice cream. French toast. Sweet rolls. Pizza. Cheese bread. Foods covered with batter, butter, creamy sauces, or cheese. Fried foods. Sugary drinks and desserts. Foods that cause gas or bloating   This information is not intended to replace advice given to you by your health care provider. Make sure you discuss any questions you have with your health care provider.   Document Released: 06/30/2013 Document Reviewed: 06/30/2013 Elsevier Interactive Patient Education 2016 Elsevier Inc.   

## 2022-12-13 ENCOUNTER — Telehealth: Payer: Self-pay | Admitting: Surgery

## 2022-12-13 NOTE — Telephone Encounter (Signed)
Patient has been advised of Pre-Admission date/time, and Surgery date at [facility].  Surgery Date: 01/31/23 Preadmission Testing Date: 01/23/23 (phone 1p-4p)  Patient has been made aware to call [phone], between 1-3:00pm the day before surgery, to find out what time to arrive for surgery.

## 2022-12-25 ENCOUNTER — Other Ambulatory Visit: Payer: Self-pay | Admitting: Acute Care

## 2022-12-25 DIAGNOSIS — Z122 Encounter for screening for malignant neoplasm of respiratory organs: Secondary | ICD-10-CM

## 2022-12-25 DIAGNOSIS — Z87891 Personal history of nicotine dependence: Secondary | ICD-10-CM

## 2022-12-25 DIAGNOSIS — F17210 Nicotine dependence, cigarettes, uncomplicated: Secondary | ICD-10-CM

## 2023-01-02 DIAGNOSIS — I8310 Varicose veins of unspecified lower extremity with inflammation: Secondary | ICD-10-CM | POA: Insufficient documentation

## 2023-01-02 NOTE — Progress Notes (Signed)
    MRN : [id number]  Aimee Lawson is a 62 y.o. (1961-06-11) female who presents with chief complaint of No chief complaint on file. .    The patient's left lower extremity was sterilely prepped and draped.  The ultrasound machine was used to visualize the left great saphenous vein throughout its course.  A segment below the knee was selected for access.  The saphenous vein was accessed without difficulty using ultrasound guidance with a micropuncture needle.   An 0.018  wire was placed beyond the saphenofemoral junction through the sheath and the microneedle was removed.  The 65 cm sheath was then placed over the wire and the wire and dilator were removed.  The laser fiber was placed through the sheath and its tip was placed approximately 2 cm below the saphenofemoral junction.  Tumescent anesthesia was then created with a dilute lidocaine solution.  Laser energy was then delivered with constant withdrawal of the sheath and laser fiber.  Approximately 2403 Joules of energy were delivered over a length of 42 cm.  Sterile dressings were placed.  The patient tolerated the procedure well without complications.

## 2023-01-03 ENCOUNTER — Encounter (INDEPENDENT_AMBULATORY_CARE_PROVIDER_SITE_OTHER): Payer: Self-pay | Admitting: Vascular Surgery

## 2023-01-03 ENCOUNTER — Ambulatory Visit (INDEPENDENT_AMBULATORY_CARE_PROVIDER_SITE_OTHER): Payer: 59 | Admitting: Vascular Surgery

## 2023-01-03 VITALS — BP 124/73 | HR 90 | Resp 16 | Ht 64.0 in | Wt 180.0 lb

## 2023-01-03 DIAGNOSIS — I8310 Varicose veins of unspecified lower extremity with inflammation: Secondary | ICD-10-CM

## 2023-01-03 DIAGNOSIS — I8312 Varicose veins of left lower extremity with inflammation: Secondary | ICD-10-CM

## 2023-01-04 ENCOUNTER — Other Ambulatory Visit (INDEPENDENT_AMBULATORY_CARE_PROVIDER_SITE_OTHER): Payer: Self-pay | Admitting: Vascular Surgery

## 2023-01-04 DIAGNOSIS — I83812 Varicose veins of left lower extremities with pain: Secondary | ICD-10-CM

## 2023-01-06 ENCOUNTER — Encounter (INDEPENDENT_AMBULATORY_CARE_PROVIDER_SITE_OTHER): Payer: Self-pay | Admitting: Vascular Surgery

## 2023-01-09 ENCOUNTER — Ambulatory Visit (INDEPENDENT_AMBULATORY_CARE_PROVIDER_SITE_OTHER): Payer: 59

## 2023-01-09 DIAGNOSIS — I83812 Varicose veins of left lower extremities with pain: Secondary | ICD-10-CM

## 2023-01-14 ENCOUNTER — Encounter: Payer: Self-pay | Admitting: Surgery

## 2023-01-14 ENCOUNTER — Ambulatory Visit: Payer: 59 | Admitting: Surgery

## 2023-01-14 VITALS — BP 110/72 | HR 94 | Temp 98.0°F | Ht 64.0 in | Wt 180.0 lb

## 2023-01-14 DIAGNOSIS — K8020 Calculus of gallbladder without cholecystitis without obstruction: Secondary | ICD-10-CM | POA: Diagnosis not present

## 2023-01-14 NOTE — Patient Instructions (Signed)
You have requested to have your gallbladder removed. This will be done at [facility] with Dr. Aleen Campi on July 25th.  You will most likely be out of work 1-2 weeks for this surgery.  If you have FMLA or disability paperwork that needs filled out you may drop this off at our office or this can be faxed to [phone].  You will return after your post-op appointment with a lifting restriction for approximately 4 more weeks.  You will be able to eat anything you would like to following surgery. But, start by eating a bland diet and advance this as tolerated. The Gallbladder diet is below, please go as closely by this diet as possible prior to surgery to avoid any further attacks.  If you have any questions, please call our office.  Laparoscopic Cholecystectomy Laparoscopic cholecystectomy is surgery to remove the gallbladder. The gallbladder is located in the upper right part of the abdomen, behind the liver. It is a storage sac for bile, which is produced in the liver. Bile aids in the digestion and absorption of fats. Cholecystectomy is often done for inflammation of the gallbladder (cholecystitis). This condition is usually caused by a buildup of gallstones (cholelithiasis) in the gallbladder. Gallstones can block the flow of bile, and that can result in inflammation and pain. In severe cases, emergency surgery may be required. If emergency surgery is not required, you will have time to prepare for the procedure. Laparoscopic surgery is an alternative to open surgery. Laparoscopic surgery has a shorter recovery time. Your common bile duct may also need to be examined during the procedure. If stones are found in the common bile duct, they may be removed. LET [facility] CARE PROVIDER KNOW ABOUT: Any allergies you have. All medicines you are taking, including vitamins, herbs, eye drops, creams, and over-the-counter medicines. Previous problems you or members of your family have had with the  use of anesthetics. Any blood disorders you have. Previous surgeries you have had.  Any medical conditions you have. RISKS AND COMPLICATIONS Generally, this is a safe procedure. However, problems may occur, including: Infection. Bleeding. Allergic reactions to medicines. Damage to other structures or organs. A stone remaining in the common bile duct. A bile leak from the cyst duct that is clipped when your gallbladder is removed. The need to convert to open surgery, which requires a larger incision in the abdomen. This may be necessary if your surgeon thinks that it is not safe to continue with a laparoscopic procedure. BEFORE THE PROCEDURE Ask your health care provider about: Changing or stopping your regular medicines. This is especially important if you are taking diabetes medicines or blood thinners. Taking medicines such as aspirin and ibuprofen. These medicines can thin your blood. Do not take these medicines before your procedure if your health care provider instructs you not to. Follow instructions from your health care provider about eating or drinking restrictions. Let your health care provider know if you develop a cold or an infection before surgery. Plan to have someone take you home after the procedure. Ask your health care provider how your surgical site will be marked or identified. You may be given antibiotic medicine to help prevent infection. PROCEDURE To reduce your risk of infection: Your health care team will wash or sanitize their hands. Your skin will be washed with soap. An IV tube may be inserted into one of your veins. You will be given a medicine to make you fall asleep (general anesthetic). A breathing tube  will be placed in your mouth. The surgeon will make several small cuts (incisions) in your abdomen. A thin, lighted tube (laparoscope) that has a tiny camera on the end will be inserted through one of the small incisions. The camera on the laparoscope  will send a picture to a TV screen (monitor) in the operating room. This will give the surgeon a good view inside your abdomen. A gas will be pumped into your abdomen. This will expand your abdomen to give the surgeon more room to perform the surgery. Other tools that are needed for the procedure will be inserted through the other incisions. The gallbladder will be removed through one of the incisions. After your gallbladder has been removed, the incisions will be closed with stitches (sutures), staples, or skin glue. Your incisions may be covered with a bandage (dressing). The procedure may vary among health care providers and hospitals. AFTER THE PROCEDURE Your blood pressure, heart rate, breathing rate, and blood oxygen level will be monitored often until the medicines you were given have worn off. You will be given medicines as needed to control your pain.   This information is not intended to replace advice given to you by your health care provider. Make sure you discuss any questions you have with your health care provider.   Document Released: 06/25/2005 Document Revised: 03/16/2015 Document Reviewed: 02/04/2013 Elsevier Interactive Patient Education 2016 Else[employer] Inc.   Low-Fat Diet for Gallbladder Conditions A low-fat diet can be helpful if you have pancreatitis or a gallbladder condition. With these conditions, your pancreas and gallbladder have trouble digesting fats. A healthy eating plan with less fat will help rest your pancreas and gallbladder and reduce your symptoms. WHAT DO I NEED TO KNOW ABOUT THIS DIET? Eat a low-fat diet. Reduce your fat intake to less than 20-30% of your total daily calories. This is less than 50-60 g of fat per day. Remember that you need some fat in your diet. Ask your dietician what your daily goal should be. Choose nonfat and low-fat healthy foods. Look for the words "nonfat," "low fat," or "fat free." As a guide, look on the label and choose foods  with less than 3 g of fat per serving. Eat only one serving. Avoid alcohol. Do not smoke. If you need help quitting, talk with your health care provider. Eat small frequent meals instead of three large heavy meals. WHAT FOODS CAN I EAT? Grains Include healthy grains and starches such as potatoes, wheat bread, fiber-rich cereal, and brown rice. Choose whole grain options whenever possible. In adults, whole grains should account for 45-65% of your daily calories.  Fruits and Vegetables Eat plenty of fruits and vegetables. Fresh fruits and vegetables add fiber to your diet. Meats and Other Protein Sources Eat lean meat such as chicken and pork. Trim any fat off of meat before cooking it. Eggs, fish, and beans are other sources of protein. In adults, these foods should account for 10-35% of your daily calories. Dairy Choose low-fat milk and dairy options. Dairy includes fat and protein, as well as calcium.  Fats and Oils Limit high-fat foods such as fried foods, sweets, baked goods, sugary drinks.  Other Creamy sauces and condiments, such as mayonnaise, can add extra fat. Think about whether or not you need to use them, or use smaller amounts or low fat options. WHAT FOODS ARE NOT RECOMMENDED? High fat foods, such as: [employer]. Ice cream. [place] toast. Sweet rolls. Pizza. Cheese bread. Foods covered with batter,  butter, creamy sauces, or cheese. Fried foods. Sugary drinks and desserts. Foods that cause gas or bloating   This information is not intended to replace advice given to you by your health care provider. Make sure you discuss any questions you have with your health care provider.   Document Released: 06/30/2013 Document Reviewed: 06/30/2013 Elsevier Interactive Patient Education [employer]

## 2023-01-14 NOTE — Progress Notes (Signed)
01/14/2023  History of Present Illness: Aimee Lawson is a 62 y.o. female presenting for follow up of symptomatic cholelithiasis.  She had a left saphenous vein ablation with Dr. Gilda Crease on 01/03/23 and she has recovered well from that.  Has had some intermittent episodes of RUQ discomfort and one episode of nausea, but no emesis and has not needed to come to the ER.  She's ready for surgery and is scheduled for 01/31/23.  Past Medical History: Past Medical History:  Diagnosis Date   Atypical mole 11/04/2013   Right superior lateral pubic   No pertinent past medical history    Squamous cell carcinoma of skin 10/31/2017   right pretibial      Past Surgical History: Past Surgical History:  Procedure Laterality Date   AUGMENTATION MAMMAPLASTY     BREAST ENHANCEMENT SURGERY     20+ yrs ago   BREAST SURGERY     COLONOSCOPY N/A 02/21/2021   Procedure: COLONOSCOPY;  Surgeon: Wyline Mood, MD;  Location: [facility];  Service: Gastroenterology;  Laterality: N/A;   FACIAL COSMETIC SURGERY      Home Medications: Prior to Admission medications   Medication Sig Start Date End Date Taking? Authorizing Provider  albuterol (VENTOLIN HFA) 108 (90 Base) MCG/ACT inhaler Inhale 2 puffs into the lungs every 6 (six) hours as needed for wheezing or shortness of breath. 11/22/21  Yes Cannady, Jolene T, NP  atorvastatin (LIPITOR) 20 MG tablet Take 1 tablet (20 mg total) by mouth daily. 03/19/22  Yes Cannady, Jolene T, NP  umeclidinium-vilanterol (ANORO ELLIPTA) 62.5-25 MCG/ACT AEPB INHALE 1 PUFF BY MOUTH EVERY DAY 06/22/22  Yes Cannady, Dorie Rank, NP    Allergies: Allergies  Allergen Reactions   Penicillins Anaphylaxis    Review of Systems: Review of Systems  Constitutional:  Negative for chills and fever.  Respiratory:  Negative for shortness of breath.   Cardiovascular:  Negative for chest pain.  Gastrointestinal:  Negative for abdominal pain, nausea and vomiting.    Physical Exam BP  110/72   Pulse 94   Temp 98 F (36.7 C)   Ht 5\' 4"  (1.626 m)   Wt 180 lb (81.6 kg)   LMP 09/07/2010 (Approximate)   SpO2 97%   BMI 30.90 kg/m  CONSTITUTIONAL: No acute distress, well nourished. HEENT:  Normocephalic, atraumatic, extraocular motion intact. RESPIRATORY:  Lungs are clear, and breath sounds are equal bilaterally. Normal respiratory effort without pathologic use of accessory muscles. CARDIOVASCULAR: Heart is regular without murmurs, gallops, or rubs. GI: The abdomen is soft, non-distended, non-tender to palpation.  Negative Murphy's sign.  NEUROLOGIC:  Motor and sensation is grossly normal.  Cranial nerves are grossly intact. PSYCH:  Alert and oriented to person, place and time. Affect is normal.  Laboratory Analysis: Labs from 08/22/2022: Sodium 143, potassium 3.8, chloride 105, CO2 23, BUN 15, creatinine 0.98.  Total bilirubin 0.4, AST 15, ALT 16, alkaline phosphatase 97, albumin 4.3.   Imaging: CT chest lung cancer screening on 03/06/2022: IMPRESSION: 1. Lung-RADS 2, benign appearance or behavior. Continue annual screening with low-dose chest CT without contrast in 12 months. 2. Cholelithiasis. 3.  Aortic atherosclerosis (ICD10-I70.0). 4.  Emphysema (ICD10-J43.9).  Assessment and Plan: This is a 62 y.o. female with symptomatic cholelithiasis.  --The patient was last seen on 12/12/22 and wanted to wait for surgery until the end of July so she can recover from her saphenous vein ablation.  She has done very well from that standpoint and is ready for her cholecystectomy. --She's  scheduled for a robotic assisted cholecystectomy on 01/31/23.  Reviewed the surgery again with her including the planned incisions, risks, the use of ICG, that this is under general anesthesia, that this is an outpatient surgery, post-operative activity restrictions, pain control, and she's willing to proceed. --All of her questions have been answered.  I spent 20 minutes dedicated to the care of  this patient on the date of this encounter to include pre-visit review of records, face-to-face time with the patient discussing diagnosis and management, and any post-visit coordination of care.   Howie Ill, MD  [facility]

## 2023-01-14 NOTE — H&P (View-Only) (Signed)
01/14/2023  History of Present Illness: Aimee Lawson is a 63 y.o. female presenting for follow up of symptomatic cholelithiasis.  She had a left saphenous vein ablation with Dr. Gilda Crease on 01/03/23 and she has recovered well from that.  Has had some intermittent episodes of RUQ discomfort and one episode of nausea, but no emesis and has not needed to come to the ER.  She's ready for surgery and is scheduled for 01/31/23.  Past Medical History: Past Medical History:  Diagnosis Date   Atypical mole 11/04/2013   Right superior lateral pubic   No pertinent past medical history    Squamous cell carcinoma of skin 10/31/2017   right pretibial      Past Surgical History: Past Surgical History:  Procedure Laterality Date   AUGMENTATION MAMMAPLASTY     BREAST ENHANCEMENT SURGERY     20+ yrs ago   BREAST SURGERY     COLONOSCOPY N/A 02/21/2021   Procedure: COLONOSCOPY;  Surgeon: Wyline Mood, MD;  Location: [facility];  Service: Gastroenterology;  Laterality: N/A;   FACIAL COSMETIC SURGERY      Home Medications: Prior to Admission medications   Medication Sig Start Date End Date Taking? Authorizing Provider  albuterol (VENTOLIN HFA) 108 (90 Base) MCG/ACT inhaler Inhale 2 puffs into the lungs every 6 (six) hours as needed for wheezing or shortness of breath. 11/22/21  Yes Cannady, Jolene T, NP  atorvastatin (LIPITOR) 20 MG tablet Take 1 tablet (20 mg total) by mouth daily. 03/19/22  Yes Cannady, Jolene T, NP  umeclidinium-vilanterol (ANORO ELLIPTA) 62.5-25 MCG/ACT AEPB INHALE 1 PUFF BY MOUTH EVERY DAY 06/22/22  Yes Cannady, Dorie Rank, NP    Allergies: Allergies  Allergen Reactions   Penicillins Anaphylaxis    Review of Systems: Review of Systems  Constitutional:  Negative for chills and fever.  Respiratory:  Negative for shortness of breath.   Cardiovascular:  Negative for chest pain.  Gastrointestinal:  Negative for abdominal pain, nausea and vomiting.    Physical Exam BP  110/72   Pulse 94   Temp 98 F (36.7 C)   Ht 5\' 4"  (1.626 m)   Wt 180 lb (81.6 kg)   LMP 09/07/2010 (Approximate)   SpO2 97%   BMI 30.90 kg/m  CONSTITUTIONAL: No acute distress, well nourished. HEENT:  Normocephalic, atraumatic, extraocular motion intact. RESPIRATORY:  Lungs are clear, and breath sounds are equal bilaterally. Normal respiratory effort without pathologic use of accessory muscles. CARDIOVASCULAR: Heart is regular without murmurs, gallops, or rubs. GI: The abdomen is soft, non-distended, non-tender to palpation.  Negative Murphy's sign.  NEUROLOGIC:  Motor and sensation is grossly normal.  Cranial nerves are grossly intact. PSYCH:  Alert and oriented to person, place and time. Affect is normal.  Laboratory Analysis: Labs from 08/22/2022: Sodium 143, potassium 3.8, chloride 105, CO2 23, BUN 15, creatinine 0.98.  Total bilirubin 0.4, AST 15, ALT 16, alkaline phosphatase 97, albumin 4.3.   Imaging: CT chest lung cancer screening on 03/06/2022: IMPRESSION: 1. Lung-RADS 2, benign appearance or behavior. Continue annual screening with low-dose chest CT without contrast in 12 months. 2. Cholelithiasis. 3.  Aortic atherosclerosis (ICD10-I70.0). 4.  Emphysema (ICD10-J43.9).  Assessment and Plan: This is a 62 y.o. female with symptomatic cholelithiasis.  --The patient was last seen on 12/12/22 and wanted to wait for surgery until the end of July so she can recover from her saphenous vein ablation.  She has done very well from that standpoint and is ready for her cholecystectomy. --She's  scheduled for a robotic assisted cholecystectomy on 01/31/23.  Reviewed the surgery again with her including the planned incisions, risks, the use of ICG, that this is under general anesthesia, that this is an outpatient surgery, post-operative activity restrictions, pain control, and she's willing to proceed. --All of her questions have been answered.  I spent 20 minutes dedicated to the care of  this patient on the date of this encounter to include pre-visit review of records, face-to-face time with the patient discussing diagnosis and management, and any post-visit coordination of care.   Howie Ill, MD  [facility]

## 2023-01-22 ENCOUNTER — Telehealth (INDEPENDENT_AMBULATORY_CARE_PROVIDER_SITE_OTHER): Payer: Self-pay

## 2023-01-22 NOTE — Telephone Encounter (Signed)
Let's get her in with a DVT study only tomorrow to make sure she doesn't have like a hematoma or seroma

## 2023-01-22 NOTE — Telephone Encounter (Signed)
Patient left a message stating that she notice a swollen spot near the inside on the side of the left knee yesterday. Patient is having some pain at level of 5. There is no warmth or redness to the area. Patient did have left GSV ablation on 01/03/23. Please Advise

## 2023-01-23 ENCOUNTER — Other Ambulatory Visit: Payer: Self-pay | Admitting: Nurse Practitioner

## 2023-01-23 ENCOUNTER — Encounter (INDEPENDENT_AMBULATORY_CARE_PROVIDER_SITE_OTHER): Payer: 59

## 2023-01-23 ENCOUNTER — Inpatient Hospital Stay: Admission: RE | Admit: 2023-01-23 | Payer: 59 | Source: Ambulatory Visit

## 2023-01-23 ENCOUNTER — Encounter (INDEPENDENT_AMBULATORY_CARE_PROVIDER_SITE_OTHER): Payer: Self-pay

## 2023-01-23 DIAGNOSIS — R600 Localized edema: Secondary | ICD-10-CM

## 2023-01-25 ENCOUNTER — Other Ambulatory Visit: Payer: Self-pay

## 2023-01-25 ENCOUNTER — Encounter: Payer: Self-pay | Admitting: Surgery

## 2023-01-25 ENCOUNTER — Encounter
Admission: RE | Admit: 2023-01-25 | Discharge: 2023-01-25 | Disposition: A | Discharge status: Home / Self Care | Payer: 59 | Source: Ambulatory Visit | Attending: Surgery | Admitting: Surgery

## 2023-01-25 VITALS — Ht 64.0 in | Wt 179.9 lb

## 2023-01-25 DIAGNOSIS — Z01812 Encounter for preprocedural laboratory examination: Secondary | ICD-10-CM

## 2023-01-25 DIAGNOSIS — K8020 Calculus of gallbladder without cholecystitis without obstruction: Secondary | ICD-10-CM

## 2023-01-25 HISTORY — DX: Atherosclerosis of aorta: I70.0

## 2023-01-25 HISTORY — DX: Mixed hyperlipidemia: E78.2

## 2023-01-25 HISTORY — DX: Emphysema, unspecified: J43.9

## 2023-01-25 NOTE — Patient Instructions (Addendum)
Your procedure is scheduled on: 7/25/ 2024  Report to the Registration Desk on the 1st floor of the [facility]. To find out your arrival time, please call [phone] between 1PM - 3PM on: 01/30/2023  If your arrival time is 6:00 am, do not arrive before that time as the [facility] entrance doors do not open until 6:00 am.  REMEMBER: Instructions that are not followed completely may result in serious medical risk, up to and including death; or upon the discretion of your surgeon and anesthesiologist your surgery may need to be rescheduled.  Do not eat food after midnight the night before surgery.  No gum chewing or hard candies.  You may however, drink CLEAR liquids up to 2 hours before you are scheduled to arrive for your surgery. Do not drink anything within 2 hours of your scheduled arrival time.  Clear liquids include: - water  - apple juice without pulp - gatorade (not RED colors) - black coffee or tea (Do NOT add milk or creamers to the coffee or tea) Do NOT drink anything that is not on this list.   One week prior to surgery: Stop Anti-inflammatories (NSAIDS) such as Advil, Aleve, Ibuprofen, Motrin, Naproxen, Naprosyn and Aspirin based products such as Excedrin, Goody's Powder, BC Powder. Stop ANY OVER THE COUNTER supplements until after surgery. You may however, continue to take Tylenol if needed for pain up until the day of surgery.  Continue taking all prescribed medications .   TAKE ONLY THESE MEDICATIONS THE MORNING OF SURGERY WITH A SIP OF WATER:  Lipitor  Use anoro ellipta inhaler on day  surgery and bring albuterol  to the hospital.   No Alcohol for 24 hours before or after surgery.  No Smoking including e-cigarettes for 24 hours before surgery.  No chewable tobacco products for at least 6 hours before surgery.  No nicotine patches on the day of surgery.  Do not use any "recreational" drugs for at least a week (preferably 2 weeks) before your surgery.   Please be advised that the combination of cocaine and anesthesia may have negative outcomes, up to and including death. If you test positive for cocaine, your surgery will be cancelled.  On the morning of surgery brush your teeth with toothpaste and water, you may rinse your mouth with mouthwash if you wish. Do not swallow any toothpaste or mouthwash.  Use CHG Soap or wipes as directed on instruction sheet.-provided for you   Do not wear jewelry, make-up, hairpins, clips or nail polish.  Do not wear lotions, powders, or perfumes.   Do not shave body hair from the neck down 48 hours before surgery.  Contact lenses, hearing aids and dentures may not be worn into surgery.  Do not bring valuables to the hospital. [facility] is not responsible for any missing/lost belongings or valuables.    Notify your doctor if there is any change in your medical condition (cold, fever, infection).  Wear comfortable clothing (specific to your surgery type) to the hospital.  After surgery, you can help prevent lung complications by doing breathing exercises.   In case of increased patient census, it may be necessary for you, the patient, to continue your postoperative care in the Same Day Surgery department.  If you are being discharged the day of surgery, you will not be allowed to drive home. You will need a responsible individual to drive you home and stay with you for 24 hours after surgery.   Please call the  Pre-admissions Testing Dept. at [phone] if you have any questions about these instructions.  Surgery Visitation Policy:  Patients having surgery or a procedure may have two visitors.  Children under the age of 62 must have an adult with them who is not the patient.  Inpatient Visitation:    Visiting hours are 7 a.m. to 8 p.m. Up to four visitors are allowed at one time in a patient room. The visitors may rotate out with other people during the day.  One visitor age 16 or older  may stay with the patient overnight and must be in the room by 8 p.m.       Preparing for Surgery with CHLORHEXIDINE GLUCONATE (CHG) Soap  Chlorhexidine Gluconate (CHG) Soap  o An antiseptic cleaner that kills germs and bonds with the skin to continue killing germs even after washing  o Used for showering the night before surgery and morning of surgery  Before surgery, you can play an important role by reducing the number of germs on your skin.  CHG (Chlorhexidine gluconate) soap is an antiseptic cleanser which kills germs and bonds with the skin to continue killing germs even after washing.  Please do not use if you have an allergy to CHG or antibacterial soaps. If your skin becomes reddened/irritated stop using the CHG.  1. Shower the NIGHT BEFORE SURGERY and the MORNING OF SURGERY with CHG soap.  2. If you choose to wash your hair, wash your hair first as usual with your normal shampoo.  3. After shampooing, rinse your hair and body thoroughly to remove the shampoo.  4. Use CHG as you would any other liquid soap. You can apply CHG directly to the skin and wash gently with a scrungie or a clean washcloth.  5. Apply the CHG soap to your body only from the neck down. Do not use on open wounds or open sores. Avoid contact with your eyes, ears, mouth, and genitals (private parts). Wash face and genitals (private parts) with your normal soap.  6. Wash thoroughly, paying special attention to the area where your surgery will be performed.  7. Thoroughly rinse your body with warm water.  8. Do not shower/wash with your normal soap after using and rinsing off the CHG soap.  9. Pat yourself dry with a clean towel.  10. Wear clean pajamas to bed the night before surgery.  12. Place clean sheets on your bed the night of your first shower and do not sleep with pets.  13. Shower again with the CHG soap on the day of surgery prior to arriving at the hospital.  14. Do not apply any  deodorants/lotions/powders.  15. Please wear clean clothes to the hospital.

## 2023-01-28 ENCOUNTER — Encounter (INDEPENDENT_AMBULATORY_CARE_PROVIDER_SITE_OTHER): Payer: Self-pay | Admitting: Vascular Surgery

## 2023-01-28 ENCOUNTER — Ambulatory Visit (INDEPENDENT_AMBULATORY_CARE_PROVIDER_SITE_OTHER): Payer: 59 | Admitting: Vascular Surgery

## 2023-01-28 ENCOUNTER — Encounter: Payer: Self-pay | Admitting: Urgent Care

## 2023-01-28 ENCOUNTER — Ambulatory Visit (INDEPENDENT_AMBULATORY_CARE_PROVIDER_SITE_OTHER): Payer: 59 | Admitting: Nurse Practitioner

## 2023-01-28 ENCOUNTER — Encounter
Admission: RE | Admit: 2023-01-28 | Discharge: 2023-01-28 | Disposition: A | Discharge status: Home / Self Care | Payer: 59 | Source: Ambulatory Visit | Attending: Surgery | Admitting: Surgery

## 2023-01-28 VITALS — BP 118/75 | HR 84 | Resp 16 | Wt 181.4 lb

## 2023-01-28 DIAGNOSIS — E782 Mixed hyperlipidemia: Secondary | ICD-10-CM

## 2023-01-28 DIAGNOSIS — K8020 Calculus of gallbladder without cholecystitis without obstruction: Secondary | ICD-10-CM

## 2023-01-28 DIAGNOSIS — I8310 Varicose veins of unspecified lower extremity with inflammation: Secondary | ICD-10-CM

## 2023-01-28 DIAGNOSIS — Z01810 Encounter for preprocedural cardiovascular examination: Secondary | ICD-10-CM | POA: Diagnosis not present

## 2023-01-28 DIAGNOSIS — Z01818 Encounter for other preprocedural examination: Secondary | ICD-10-CM | POA: Diagnosis not present

## 2023-01-28 DIAGNOSIS — Z01812 Encounter for preprocedural laboratory examination: Secondary | ICD-10-CM

## 2023-01-28 DIAGNOSIS — J432 Centrilobular emphysema: Secondary | ICD-10-CM

## 2023-01-28 LAB — CBC WITH DIFFERENTIAL/PLATELET
Abs Immature Granulocytes: 0.02 10*3/uL (ref 0.00–0.07)
Basophils Absolute: 0 10*3/uL (ref 0.0–0.1)
Basophils Relative: 1 %
Eosinophils Absolute: 0.1 10*3/uL (ref 0.0–0.5)
Eosinophils Relative: 3 %
HCT: 38.8 % (ref 36.0–46.0)
Hemoglobin: 13.3 g/dL (ref 12.0–15.0)
Immature Granulocytes: 0 %
Lymphocytes Relative: 39 %
Lymphs Abs: 2.2 10*3/uL (ref 0.7–4.0)
MCH: 31.4 pg (ref 26.0–34.0)
MCHC: 34.3 g/dL (ref 30.0–36.0)
MCV: 91.7 fL (ref 80.0–100.0)
Monocytes Absolute: 0.3 10*3/uL (ref 0.1–1.0)
Monocytes Relative: 6 %
Neutro Abs: 2.9 10*3/uL (ref 1.7–7.7)
Neutrophils Relative %: 51 %
Platelets: 225 10*3/uL (ref 150–400)
RBC: 4.23 MIL/uL (ref 3.87–5.11)
RDW: 12.4 % (ref 11.5–15.5)
WBC: 5.7 10*3/uL (ref 4.0–10.5)
nRBC: 0 % (ref 0.0–0.2)

## 2023-01-28 LAB — COMPREHENSIVE METABOLIC PANEL
ALT: 17 U/L (ref 0–44)
AST: 15 U/L (ref 15–41)
Albumin: 4.1 g/dL (ref 3.5–5.0)
Alkaline Phosphatase: 92 U/L (ref 38–126)
Anion gap: 8 (ref 5–15)
BUN: 18 mg/dL (ref 8–23)
CO2: 25 mmol/L (ref 22–32)
Calcium: 8.8 mg/dL — ABNORMAL LOW (ref 8.9–10.3)
Chloride: 108 mmol/L (ref 98–111)
Creatinine, Ser: 0.98 mg/dL (ref 0.44–1.00)
GFR, Estimated: >60 mL/min (ref >60)
Glucose, Bld: 125 mg/dL — ABNORMAL HIGH (ref 70–99)
Potassium: 3.6 mmol/L (ref 3.5–5.1)
Sodium: 141 mmol/L (ref 135–145)
Total Bilirubin: 0.6 mg/dL (ref 0.3–1.2)
Total Protein: 6.7 g/dL (ref 6.5–8.1)

## 2023-01-28 NOTE — Progress Notes (Signed)
MRN : [id number]  Aimee Lawson is a 62 y.o. (1960-11-29) female who presents with chief complaint of varicose veins hurt.  History of Present Illness:   The patient returns to the office for followup status post laser ablation of the left great saphenous vein on 01/03/2023.  The patient note significant improvement in the lower extremity pain but not resolution of the symptoms. The patient notes multiple residual varicosities bilaterally which continued to hurt with dependent positions and remained tender to palpation. The patient's swelling is minimally from preoperative status. The patient continues to wear graduated compression stockings on a daily basis but these are not eliminating the pain and discomfort. The patient continues to use over-the-counter anti-inflammatory medications to treat the pain and related symptoms but this has not given the patient relief. The patient notes the pain in the lower extremities is causing problems with daily exercise, problems at work and even with household activities such as preparing meals and doing dishes.  The patient is otherwise done well and there have been no complications related to the laser procedure or interval changes in the patient's overall   Post laser ultrasound dated 01/09/2023 shows successful ablation of the left great saphenous vein.   No outpatient medications have been marked as taking for the 01/28/23 encounter (Appointment) with Gilda Crease, Latina Craver, MD.    Past Medical History:  Diagnosis Date   Aortic atherosclerosis (HCC)    Atypical mole 11/04/2013   Right superior lateral pubic   Emphysema lung (HCC)    Mixed hyperlipidemia    Squamous cell carcinoma of skin 10/31/2017   right pretibial    Symptomatic cholelithiasis     Past Surgical History:  Procedure Laterality Date   AUGMENTATION MAMMAPLASTY     BREAST ENHANCEMENT SURGERY     20+ yrs ago   BREAST SURGERY     COLONOSCOPY N/A 02/21/2021    Procedure: COLONOSCOPY;  Surgeon: Wyline Mood, MD;  Location: [facility] ENDOSCOPY;  Service: Gastroenterology;  Laterality: N/A;   FACIAL COSMETIC SURGERY      Social History Social History   Tobacco Use   Smoking status: Every Day    Current packs/day: 0.50    Average packs/day: 0.5 packs/day for 44.0 years (22.0 ttl pk-yrs)    Types: Cigarettes    Passive exposure: Past   Smokeless tobacco: Never  Vaping Use   Vaping status: Never Used  Substance Use Topics   Alcohol use: Yes    Comment: socially   Drug use: Never    Family History Family History  Problem Relation Age of Onset   Heart attack Mother    Stroke Mother    Uterine cancer Mother    Colon cancer Mother    Heart attack Father    Hypertension Brother    Other Brother        Pacemaker   Emphysema Brother    Diabetes Maternal Grandmother    Heart disease Maternal Grandmother    Heart disease Maternal Grandfather    Heart disease Paternal Grandmother    Heart disease Paternal Grandfather     Allergies  Allergen Reactions   Penicillins Anaphylaxis     REVIEW OF SYSTEMS (Negative unless checked)  Constitutional: [] Weight loss  [] Fever  [] Chills Cardiac: [] Chest pain   [] Chest pressure   [] Palpitations   [] Shortness of breath when laying flat   [] Shortness of breath with exertion. Vascular:  [] Pain in legs  with walking   [x] Pain in legs with standing  [] History of DVT   [] Phlebitis   [] Swelling in legs   [x] Varicose veins   [] Non-healing ulcers Pulmonary:   [] Uses home oxygen   [] Productive cough   [] Hemoptysis   [] Wheeze  [] COPD   [] Asthma Neurologic:  [] Dizziness   [] Seizures   [] History of stroke   [] History of TIA  [] Aphasia   [] Vissual changes   [] Weakness or numbness in arm   [] Weakness or numbness in leg Musculoskeletal:   [] Joint swelling   [] Joint pain   [] Low back pain Hematologic:  [] Easy bruising  [] Easy bleeding   [] Hypercoagulable state   [] Anemic Gastrointestinal:  [] Diarrhea   [] Vomiting   [] Gastroesophageal reflux/heartburn   [] Difficulty swallowing. Genitourinary:  [] Chronic kidney disease   [] Difficult urination  [] Frequent urination   [] Blood in urine Skin:  [] Rashes   [] Ulcers  Psychological:  [] History of anxiety   []  History of major depression.  Physical Examination  There were no vitals filed for this visit. There is no height or weight on file to calculate BMI. Gen: WD/WN, NAD Head: [place]/AT, No temporalis wasting.  Ear/Nose/Throat: Hearing grossly intact, nares w/o erythema or drainage, pinna without lesions Eyes: PER, EOMI, sclera nonicteric.  Neck: Supple, no gross masses.  No JVD.  Pulmonary:  Good air movement, no audible wheezing, no use of accessory muscles.  Cardiac: RRR, precordium not hyperdynamic. Vascular:  Large varicosities present, greater than 10 mm left lower extremity.  Veins are tender to palpation  Mild venous stasis changes to the legs bilaterally.  Trace soft pitting edema CEAP C3sEpAsPr Vessel Right Left  Radial Palpable Palpable  Gastrointestinal: soft, non-distended. No guarding/no peritoneal signs.  Musculoskeletal: M/S 5/5 throughout.  No deformity.  Neurologic: CN 2-12 intact. Pain and light touch intact in extremities.  Symmetrical.  Speech is fluent. Motor exam as listed above. Psychiatric: Judgment intact, Mood & affect appropriate for pt's clinical situation. Dermatologic: Venous rashes no ulcers noted.  No changes consistent with cellulitis. Lymph : No lichenification or skin changes of chronic lymphedema.  CBC Lab Results  Component Value Date   WBC 5.7 01/28/2023   HGB 13.3 01/28/2023   HCT 38.8 01/28/2023   MCV 91.7 01/28/2023   PLT 225 01/28/2023    BMET    Component Value Date/Time   NA 141 01/28/2023 1315   NA 143 08/22/2022 1424   K 3.6 01/28/2023 1315   CL 108 01/28/2023 1315   CO2 25 01/28/2023 1315   GLUCOSE 125 (H) 01/28/2023 1315   BUN 18 01/28/2023 1315   BUN 15 08/22/2022 1424   CREATININE 0.98  01/28/2023 1315   CALCIUM 8.8 (L) 01/28/2023 1315   GFRNONAA >60 01/28/2023 1315   GFRAA 71 03/03/2020 1053   Estimated Creatinine Clearance: 60 mL/min (by C-G formula based on SCr of 0.98 mg/dL).  COAG No results found for: "INR", "PROTIME"  Radiology VAS [place] LOWER EXTREMITY VENOUS POST ABLATION  Result Date: 01/09/2023  Lower Venous Reflux Study Patient Name:  Aimee Lawson  Date of Exam:   01/09/2023 Medical Rec #: [id number]         Accession #:    [id number] Date of Birth: [birth year]         Patient Gender: F Patient Age:   2 years Exam Location:  [facility] Procedure:      VAS [place] LOWER EXTREMITY VENOUS POST ABLATION Referring Phys: Levora Dredge --------------------------------------------------------------------------------  Indications: Post Lt GSV Ablation Imaging.  Performing Technologist: Debbe Bales RVS  Examination Guidelines: A complete evaluation includes B-mode imaging, spectral Doppler, color Doppler, and power Doppler as needed of all accessible portions of each vessel. Bilateral testing is considered an integral part of a complete examination. Limited examinations for reoccurring indications may be performed as noted. The reflux portion of the exam is performed with the patient in reverse Trendelenburg. Significant venous reflux is defined as >500 ms in the superficial venous system, and >1 second in the deep venous system.  +--------------+--------+------+----------+------------+-----------------------+ LEFT          Reflux  Reflux  Reflux  Diameter cmsComments                              No       Yes     Time                                       +--------------+--------+------+----------+------------+-----------------------+ CFV           no                                                          +--------------+--------+------+----------+------------+-----------------------+ FV prox       no                                                           +--------------+--------+------+----------+------------+-----------------------+ FV mid        no                                                          +--------------+--------+------+----------+------------+-----------------------+ FV dist       no                                                          +--------------+--------+------+----------+------------+-----------------------+ Popliteal     no                                                          +--------------+--------+------+----------+------------+-----------------------+ GSV at SFJ    no                                                          +--------------+--------+------+----------+------------+-----------------------+ GSV prox thigh  prior                                                                     ablation/stripping      +--------------+--------+------+----------+------------+-----------------------+ GSV mid thigh                                     prior                                                                     ablation/stripping      +--------------+--------+------+----------+------------+-----------------------+ GSV dist thigh                                    prior                                                                     ablation/stripping      +--------------+--------+------+----------+------------+-----------------------+ GSV at knee                                       prior                                                                     ablation/stripping      +--------------+--------+------+----------+------------+-----------------------+   Summary: Left: - No flowseen in the Left GSV from the Knee level to Proximal Thigh area via Ablation.  *See table(s) above for measurements and observations. Electronically signed by Levora Dredge MD on 01/09/2023 at 4:28:09 PM.     Final      Assessment/Plan 1. Varicose veins with inflammation Recommend:  The patient has had successful ablation of the previously incompetent saphenous venous system but still has persistent symptoms of pain and swelling that are having a negative impact on daily life and daily activities.  Patient should undergo injection sclerotherapy to treat the residual varicosities.  The risks, benefits and alternative therapies were reviewed in detail with the patient.  All questions were answered.  The patient agrees to proceed with sclerotherapy at their convenience.  The patient will continue wearing the graduated compression stockings and using the over-the-counter pain medications to treat her symptoms.      2. Centrilobular emphysema (HCC) Continue pulmonary medications and aerosols as already ordered, these medications have been reviewed and there are  no changes at this time.   3. Mixed hyperlipidemia Continue statin as ordered and reviewed, no changes at this time    Levora Dredge, MD  01/28/2023 3:43 PM

## 2023-01-31 ENCOUNTER — Ambulatory Visit
Admission: RE | Admit: 2023-01-31 | Discharge: 2023-01-31 | Disposition: A | Discharge status: Home / Self Care | Payer: 59 | Attending: Surgery | Admitting: Surgery

## 2023-01-31 ENCOUNTER — Encounter
Admission: RE | Disposition: A | Discharge status: Home / Self Care | Payer: Self-pay | Source: Home / Self Care | Attending: Surgery

## 2023-01-31 ENCOUNTER — Ambulatory Visit (INDEPENDENT_AMBULATORY_CARE_PROVIDER_SITE_OTHER): Payer: 59 | Admitting: Nurse Practitioner

## 2023-01-31 ENCOUNTER — Other Ambulatory Visit: Payer: Self-pay

## 2023-01-31 ENCOUNTER — Encounter: Payer: Self-pay | Admitting: Surgery

## 2023-01-31 ENCOUNTER — Ambulatory Visit: Payer: 59 | Admitting: Anesthesiology

## 2023-01-31 ENCOUNTER — Ambulatory Visit: Payer: 59 | Admitting: Urgent Care

## 2023-01-31 DIAGNOSIS — E785 Hyperlipidemia, unspecified: Secondary | ICD-10-CM | POA: Diagnosis not present

## 2023-01-31 DIAGNOSIS — K8020 Calculus of gallbladder without cholecystitis without obstruction: Secondary | ICD-10-CM | POA: Diagnosis not present

## 2023-01-31 DIAGNOSIS — K8010 Calculus of gallbladder with chronic cholecystitis without obstruction: Secondary | ICD-10-CM | POA: Diagnosis not present

## 2023-01-31 HISTORY — DX: Calculus of gallbladder without cholecystitis without obstruction: K80.20

## 2023-01-31 SURGERY — CHOLECYSTECTOMY, ROBOT-ASSISTED, LAPAROSCOPIC
Anesthesia: General

## 2023-01-31 MED ORDER — MIDAZOLAM HCL 2 MG/2ML IJ SOLN
INTRAMUSCULAR | Status: AC
  Filled 2023-01-31: qty 2

## 2023-01-31 MED ORDER — GABAPENTIN 300 MG PO CAPS
300.0000 mg | ORAL_CAPSULE | ORAL | Status: AC
  Administered 2023-01-31: 300 mg via ORAL

## 2023-01-31 MED ORDER — CHLORHEXIDINE GLUCONATE CLOTH 2 % EX PADS: 6.0000 | MEDICATED_PAD | Freq: Once | CUTANEOUS | Status: DC

## 2023-01-31 MED ORDER — ACETAMINOPHEN 500 MG PO TABS: 1000.0000 mg | ORAL_TABLET | Freq: Four times a day (QID) | ORAL | Status: DC | PRN

## 2023-01-31 MED ORDER — MIDAZOLAM HCL 2 MG/2ML IJ SOLN
INTRAMUSCULAR | Status: DC | PRN
  Administered 2023-01-31: 2 mg via INTRAVENOUS

## 2023-01-31 MED ORDER — FENTANYL CITRATE (PF) 100 MCG/2ML IJ SOLN: 25.0000 ug | INTRAMUSCULAR | Status: DC | PRN

## 2023-01-31 MED ORDER — GABAPENTIN 300 MG PO CAPS
ORAL_CAPSULE | ORAL | Status: AC
  Filled 2023-01-31: qty 1

## 2023-01-31 MED ORDER — ACETAMINOPHEN 500 MG PO TABS
ORAL_TABLET | ORAL | Status: AC
  Filled 2023-01-31: qty 2

## 2023-01-31 MED ORDER — CHLORHEXIDINE GLUCONATE 0.12 % MT SOLN
OROMUCOSAL | Status: AC
  Filled 2023-01-31: qty 15

## 2023-01-31 MED ORDER — DEXAMETHASONE SODIUM PHOSPHATE 10 MG/ML IJ SOLN
INTRAMUSCULAR | Status: DC | PRN
  Administered 2023-01-31: 5 mg via INTRAVENOUS

## 2023-01-31 MED ORDER — ONDANSETRON HCL 4 MG/2ML IJ SOLN
INTRAMUSCULAR | Status: DC | PRN
  Administered 2023-01-31: 4 mg via INTRAVENOUS

## 2023-01-31 MED ORDER — HYDROMORPHONE HCL 1 MG/ML IJ SOLN
INTRAMUSCULAR | Status: DC | PRN
  Administered 2023-01-31 (×2): .5 mg via INTRAVENOUS

## 2023-01-31 MED ORDER — CIPROFLOXACIN IN D5W 400 MG/200ML IV SOLN
400.0000 mg | INTRAVENOUS | Status: AC
  Administered 2023-01-31: 400 mg via INTRAVENOUS

## 2023-01-31 MED ORDER — PHENYLEPHRINE HCL-NACL 20-0.9 MG/250ML-% IV SOLN
INTRAVENOUS | Status: AC
  Filled 2023-01-31: qty 250

## 2023-01-31 MED ORDER — BUPIVACAINE-EPINEPHRINE (PF) 0.25% -1:200000 IJ SOLN
INTRAMUSCULAR | Status: DC | PRN
  Administered 2023-01-31: 30 mL

## 2023-01-31 MED ORDER — METRONIDAZOLE 500 MG/100ML IV SOLN
500.0000 mg | INTRAVENOUS | Status: AC
  Administered 2023-01-31: 500 mg via INTRAVENOUS
  Filled 2023-01-31: qty 100

## 2023-01-31 MED ORDER — ACETAMINOPHEN 500 MG PO TABS
1000.0000 mg | ORAL_TABLET | ORAL | Status: AC
  Administered 2023-01-31: 1000 mg via ORAL

## 2023-01-31 MED ORDER — IBUPROFEN 600 MG PO TABS: 600.0000 mg | ORAL_TABLET | Freq: Three times a day (TID) | ORAL | 1 refills | Status: DC | PRN

## 2023-01-31 MED ORDER — FENTANYL CITRATE (PF) 100 MCG/2ML IJ SOLN
INTRAMUSCULAR | Status: AC
  Filled 2023-01-31: qty 2

## 2023-01-31 MED ORDER — BUPIVACAINE LIPOSOME 1.3 % IJ SUSP: 20.0000 mL | Freq: Once | INTRAMUSCULAR | Status: DC

## 2023-01-31 MED ORDER — PHENYLEPHRINE HCL (PRESSORS) 10 MG/ML IV SOLN
INTRAVENOUS | Status: DC | PRN
  Administered 2023-01-31 (×2): 80 ug via INTRAVENOUS

## 2023-01-31 MED ORDER — OXYCODONE HCL 5 MG PO TABS: 5.0000 mg | ORAL_TABLET | ORAL | 0 refills | Status: DC | PRN

## 2023-01-31 MED ORDER — ORAL CARE MOUTH RINSE: 15.0000 mL | Freq: Once | OROMUCOSAL | Status: AC

## 2023-01-31 MED ORDER — FAMOTIDINE 20 MG PO TABS
20.0000 mg | ORAL_TABLET | Freq: Once | ORAL | Status: AC
  Administered 2023-01-31: 20 mg via ORAL

## 2023-01-31 MED ORDER — CHLORHEXIDINE GLUCONATE 0.12 % MT SOLN
15.0000 mL | Freq: Once | OROMUCOSAL | Status: AC
  Administered 2023-01-31: 15 mL via OROMUCOSAL

## 2023-01-31 MED ORDER — KETOROLAC TROMETHAMINE 30 MG/ML IJ SOLN
INTRAMUSCULAR | Status: AC
  Filled 2023-01-31: qty 1

## 2023-01-31 MED ORDER — FENTANYL CITRATE (PF) 100 MCG/2ML IJ SOLN
INTRAMUSCULAR | Status: DC | PRN
  Administered 2023-01-31 (×2): 50 ug via INTRAVENOUS

## 2023-01-31 MED ORDER — FAMOTIDINE 20 MG PO TABS
ORAL_TABLET | ORAL | Status: AC
  Filled 2023-01-31: qty 1

## 2023-01-31 MED ORDER — BUPIVACAINE-EPINEPHRINE (PF) 0.25% -1:200000 IJ SOLN
INTRAMUSCULAR | Status: AC
  Filled 2023-01-31: qty 30

## 2023-01-31 MED ORDER — KETOROLAC TROMETHAMINE 30 MG/ML IJ SOLN
INTRAMUSCULAR | Status: DC | PRN
  Administered 2023-01-31: 30 mg via INTRAVENOUS

## 2023-01-31 MED ORDER — LIDOCAINE HCL (CARDIAC) PF 100 MG/5ML IV SOSY
PREFILLED_SYRINGE | INTRAVENOUS | Status: DC | PRN
  Administered 2023-01-31: 50 mg via INTRAVENOUS

## 2023-01-31 MED ORDER — ACETAMINOPHEN 10 MG/ML IV SOLN
INTRAVENOUS | Status: AC
  Filled 2023-01-31: qty 100

## 2023-01-31 MED ORDER — ROCURONIUM BROMIDE 100 MG/10ML IV SOLN
INTRAVENOUS | Status: DC | PRN
  Administered 2023-01-31: 50 mg via INTRAVENOUS
  Administered 2023-01-31: 10 mg via INTRAVENOUS

## 2023-01-31 MED ORDER — CIPROFLOXACIN IN D5W 400 MG/200ML IV SOLN
INTRAVENOUS | Status: AC
  Filled 2023-01-31: qty 200

## 2023-01-31 MED ORDER — SUGAMMADEX SODIUM 200 MG/2ML IV SOLN
INTRAVENOUS | Status: DC | PRN
  Administered 2023-01-31: 200 mg via INTRAVENOUS

## 2023-01-31 MED ORDER — CHLORHEXIDINE GLUCONATE CLOTH 2 % EX PADS
6.0000 | MEDICATED_PAD | Freq: Once | CUTANEOUS | Status: AC
  Administered 2023-01-31: 6 via TOPICAL

## 2023-01-31 MED ORDER — LACTATED RINGERS IV SOLN: INTRAVENOUS | Status: DC | PRN

## 2023-01-31 MED ORDER — INDOCYANINE GREEN 25 MG IV SOLR
INTRAVENOUS | Status: AC
  Filled 2023-01-31: qty 10

## 2023-01-31 MED ORDER — EPHEDRINE SULFATE (PRESSORS) 50 MG/ML IJ SOLN
INTRAMUSCULAR | Status: DC | PRN
  Administered 2023-01-31 (×2): 5 mg via INTRAVENOUS

## 2023-01-31 MED ORDER — LACTATED RINGERS IV SOLN: INTRAVENOUS | Status: DC

## 2023-01-31 MED ORDER — INDOCYANINE GREEN 25 MG IV SOLR
2.5000 mg | INTRAVENOUS | Status: AC
  Administered 2023-01-31: 2.5 mg via INTRAVENOUS

## 2023-01-31 MED ORDER — PROPOFOL 10 MG/ML IV BOLUS
INTRAVENOUS | Status: AC
  Filled 2023-01-31: qty 60

## 2023-01-31 MED ORDER — HYDROMORPHONE HCL 1 MG/ML IJ SOLN
INTRAMUSCULAR | Status: AC
  Filled 2023-01-31: qty 1

## 2023-01-31 MED ORDER — PROPOFOL 10 MG/ML IV BOLUS
INTRAVENOUS | Status: DC | PRN
Start: 2023-01-31 — End: 2023-01-31
  Administered 2023-01-31: 140 mg via INTRAVENOUS

## 2023-01-31 MED ORDER — ONDANSETRON HCL 4 MG/2ML IJ SOLN: 4.0000 mg | Freq: Once | INTRAMUSCULAR | Status: DC | PRN

## 2023-01-31 SURGICAL SUPPLY — 49 items
ADH SKN CLS APL DERMABOND .7 (GAUZE/BANDAGES/DRESSINGS) ×1
BAG PRESSURE INF REUSE 1000 (BAG) IMPLANT
CANNULA CAP OBTURATR AIRSEAL 8 (CAP) IMPLANT
CAUTERY HOOK MNPLR 1.6 DVNC XI (INSTRUMENTS) ×2 IMPLANT
CLIP LIGATING HEMO O LOK GREEN (MISCELLANEOUS) ×2 IMPLANT
DERMABOND ADVANCED .7 DNX12 (GAUZE/BANDAGES/DRESSINGS) ×2 IMPLANT
DRAPE ARM DVNC X/XI (DISPOSABLE) ×8 IMPLANT
DRAPE COLUMN DVNC XI (DISPOSABLE) ×2 IMPLANT
ELECT CAUTERY BLADE TIP 2.5 (TIP) ×1
ELECT REM PT RETURN 9FT ADLT (ELECTROSURGICAL) ×1
ELECTRODE CAUTERY BLDE TIP 2.5 (TIP) ×2 IMPLANT
ELECTRODE REM PT RTRN 9FT ADLT (ELECTROSURGICAL) ×2 IMPLANT
FORCEPS BPLR R/ABLATION 8 DVNC (INSTRUMENTS) ×2 IMPLANT
FORCEPS PROGRASP DVNC XI (FORCEP) ×2 IMPLANT
GLOVE SURG SYN 7.0 (GLOVE) ×4 IMPLANT
GLOVE SURG SYN 7.0 PF PI (GLOVE) ×4 IMPLANT
GLOVE SURG SYN 7.5 E (GLOVE) ×4 IMPLANT
GLOVE SURG SYN 7.5 PF PI (GLOVE) ×4 IMPLANT
GOWN STRL REUS W/ TWL LRG LVL3 (GOWN DISPOSABLE) ×8 IMPLANT
GOWN STRL REUS W/TWL LRG LVL3 (GOWN DISPOSABLE) ×4
IRRIGATOR SUCT 8 DISP DVNC XI (IRRIGATION / IRRIGATOR) IMPLANT
IV NS 1000ML (IV SOLUTION)
IV NS 1000ML BAXH (IV SOLUTION) IMPLANT
KIT PINK PAD W/HEAD ARE REST (MISCELLANEOUS) ×1
KIT PINK PAD W/HEAD ARM REST (MISCELLANEOUS) ×2 IMPLANT
LABEL OR SOLS (LABEL) ×2 IMPLANT
MANIFOLD NEPTUNE II (INSTRUMENTS) ×2 IMPLANT
NDL HYPO 22X1.5 SAFETY MO (MISCELLANEOUS) ×2 IMPLANT
NEEDLE HYPO 22X1.5 SAFETY MO (MISCELLANEOUS) ×1 IMPLANT
NS IRRIG 500ML POUR BTL (IV SOLUTION) ×2 IMPLANT
OBTURATOR OPTICAL STND 8 DVNC (TROCAR) ×1
OBTURATOR OPTICALSTD 8 DVNC (TROCAR) ×2 IMPLANT
PACK LAP CHOLECYSTECTOMY (MISCELLANEOUS) ×2 IMPLANT
PENCIL SMOKE EVACUATOR (MISCELLANEOUS) ×2 IMPLANT
SEAL UNIV 5-12 XI (MISCELLANEOUS) ×8 IMPLANT
SET TUBE FILTERED XL AIRSEAL (SET/KITS/TRAYS/PACK) IMPLANT
SET TUBE SMOKE EVAC HIGH FLOW (TUBING) ×2 IMPLANT
SOL ELECTROSURG ANTI STICK (MISCELLANEOUS) ×1
SOLUTION ELECTROSURG ANTI STCK (MISCELLANEOUS) ×2 IMPLANT
SPIKE FLUID TRANSFER (MISCELLANEOUS) ×2 IMPLANT
SPONGE T-LAP 18X18 ~~LOC~~+RFID (SPONGE) IMPLANT
SPONGE T-LAP 4X18 ~~LOC~~+RFID (SPONGE) ×2 IMPLANT
SUT MNCRL AB 4-0 PS2 18 (SUTURE) ×2 IMPLANT
SUT VIC AB 3-0 SH 27 (SUTURE)
SUT VIC AB 3-0 SH 27X BRD (SUTURE) IMPLANT
SUT VICRYL 0 UR6 27IN ABS (SUTURE) ×4 IMPLANT
SYS BAG RETRIEVAL 10MM (BASKET) ×1
SYSTEM BAG RETRIEVAL 10MM (BASKET) ×2 IMPLANT
WATER STERILE IRR 500ML POUR (IV SOLUTION) ×2 IMPLANT

## 2023-01-31 NOTE — Anesthesia Preprocedure Evaluation (Signed)
Anesthesia Evaluation  Patient identified by MRN, date of birth, ID band Patient awake    Reviewed: Allergy & Precautions, NPO status , Patient's Chart, lab work & pertinent test results  Airway Mallampati: III  TM Distance: >3 FB Neck ROM: Full    Dental  (+) Teeth Intact, Caps,    Pulmonary neg pulmonary ROS, COPD,  COPD inhaler, Current Smoker and Patient abstained from smoking.   Pulmonary exam normal breath sounds clear to auscultation       Cardiovascular negative cardio ROS Normal cardiovascular exam Rhythm:Regular Rate:Normal     Neuro/Psych negative neurological ROS  negative psych ROS   GI/Hepatic negative GI ROS, Neg liver ROS,,,  Endo/Other  negative endocrine ROS    Renal/GU negative Renal ROS     Musculoskeletal   Abdominal   Peds negative pediatric ROS (+)  Hematology negative hematology ROS (+)   Anesthesia Other Findings Past Medical History: No date: Aortic atherosclerosis (HCC) 11/04/2013: Atypical mole     Comment:  Right superior lateral pubic No date: Emphysema lung (HCC) No date: Mixed hyperlipidemia 10/31/2017: Squamous cell carcinoma of skin     Comment:  right pretibial  No date: Symptomatic cholelithiasis  Past Surgical History: No date: AUGMENTATION MAMMAPLASTY No date: BREAST ENHANCEMENT SURGERY     Comment:  20+ yrs ago No date: BREAST SURGERY 02/21/2021: COLONOSCOPY; N/A     Comment:  Procedure: COLONOSCOPY;  Surgeon: Wyline Mood, MD;                Location: [facility] ENDOSCOPY;  Service: Gastroenterology;                Laterality: N/A; No date: FACIAL COSMETIC SURGERY  BMI    Body Mass Index: 30.90 kg/m      Reproductive/Obstetrics negative OB ROS                             Anesthesia Physical Anesthesia Plan  ASA: 2  Anesthesia Plan: General   Post-op Pain Management:    Induction: Intravenous  PONV Risk Score and Plan: 1 and  Ondansetron and Dexamethasone  Airway Management Planned: Oral ETT  Additional Equipment:   Intra-op Plan:   Post-operative Plan: Extubation in OR  Informed Consent: I have reviewed the patients History and Physical, chart, labs and discussed the procedure including the risks, benefits and alternatives for the proposed anesthesia with the patient or authorized representative who has indicated his/her understanding and acceptance.     Dental Advisory Given  Plan Discussed with: CRNA and Surgeon  Anesthesia Plan Comments:        Anesthesia Quick Evaluation

## 2023-01-31 NOTE — Op Note (Signed)
  Procedure Date:  01/31/2023  Pre-operative Diagnosis:  Symptomatic cholelithiasis  Post-operative Diagnosis: Symptomatic cholelithiasis  Procedure:  Robotic assisted cholecystectomy with ICG FireFly cholangiogram  Surgeon:  Howie Ill, MD  Anesthesia:  General endotracheal  Estimated Blood Loss:  10 ml  Specimens:  gallbladder  Complications:  None  Indications for Procedure:  This is a 62 y.o. female who presents with abdominal pain and workup revealing symptomatic cholelithiasis.  The benefits, complications, treatment options, and expected outcomes were discussed with the patient. The risks of bleeding, infection, recurrence of symptoms, failure to resolve symptoms, bile duct damage, bile duct leak, retained common bile duct stone, bowel injury, and need for further procedures were all discussed with the patient and she was willing to proceed.  Description of Procedure: The patient was correctly identified in the preoperative area and brought into the operating room.  The patient was placed supine with VTE prophylaxis in place.  Appropriate time-outs were performed.  Anesthesia was induced and the patient was intubated.  Appropriate antibiotics were infused.  The abdomen was prepped and draped in a sterile fashion. An infraumbilical incision was made. A cutdown technique was used to enter the abdominal cavity without injury, and a 12 mm robotic port was inserted.  Pneumoperitoneum was obtained with appropriate opening pressures.  Three 8-mm ports were placed in the mid abdomen at the level of the umbilicus under direct visualization.  The DaVinci platform was docked, camera targeted, and instruments were placed under direct visualization.  The gallbladder was identified.  There were dense adhesions from the omentum, transverse colon, and stomach onto the gallbladder which required careful dissection both bluntly and with cautery.  Once the gallbladder was finally mobilized, the  fundus was grasped and retracted cephalad.  Any further adhesions were lysed bluntly and with electrocautery. The infundibulum was grasped and retracted laterally, exposing the peritoneum overlying the gallbladder.  This was incised with electrocautery and extended on either side of the gallbladder.  FireFly cholangiogram was then obtained, and we were able to clearly identify the cystic duct and common bile duct.  The cystic duct and cystic artery were carefully dissected with combination of cautery and blunt dissection.  Both were clipped twice proximally and once distally, cutting in between.  The gallbladder was taken from the gallbladder fossa in a retrograde fashion with electrocautery. The gallbladder was placed in an Endocatch bag. The liver bed was inspected and any bleeding was controlled with electrocautery. The right upper quadrant was then inspected again revealing intact clips, no bleeding, and no ductal injury.  The 8 mm ports were removed under direct visualization and the 12 mm port was removed.  The Endocatch bag was brought out via the umbilical incision. The fascial opening was closed using 0 vicryl suture.  Local anesthetic was infused in all incisions and the incisions were closed with 4-0 Monocryl.  The wounds were cleaned and sealed with DermaBond.  The patient was emerged from anesthesia and extubated and brought to the recovery room for further management.  The patient tolerated the procedure well and all counts were correct at the end of the case.   Howie Ill, MD

## 2023-01-31 NOTE — Transfer of Care (Signed)
Immediate Anesthesia Transfer of Care Note  Patient: Aimee Lawson  Procedure(s) Performed: XI ROBOTIC ASSISTED LAPAROSCOPIC CHOLECYSTECTOMY INDOCYANINE GREEN FLUORESCENCE IMAGING (ICG)  Patient Location: PACU  Anesthesia Type:General  Level of Consciousness: drowsy  Airway & Oxygen Therapy: Patient Spontanous Breathing and Patient connected to face mask oxygen  Post-op Assessment: Report given to RN and Post -op Vital signs reviewed and stable  Post vital signs: Reviewed  Last Vitals:  Vitals Value Taken Time  BP 140/73 01/31/23 0915  Temp 36.2 C 01/31/23 0909  Pulse 87 01/31/23 0918  Resp 24 01/31/23 0918  SpO2 100 % 01/31/23 0918  Vitals shown include unfiled device data.  Last Pain:  Vitals:   01/31/23 0909  TempSrc:   PainSc: Asleep         Complications: No notable events documented.

## 2023-01-31 NOTE — Interval H&P Note (Signed)
History and Physical Interval Note:  01/31/2023 7:06 AM  Aimee Lawson  has presented today for surgery, with the diagnosis of symptomatic cholelilthiasis.  The various methods of treatment have been discussed with the patient and family. After consideration of risks, benefits and other options for treatment, the patient has consented to  Procedure(s): XI ROBOTIC ASSISTED LAPAROSCOPIC CHOLECYSTECTOMY (N/A) INDOCYANINE GREEN FLUORESCENCE IMAGING (ICG) (N/A) as a surgical intervention.  The patient's history has been reviewed, patient examined, no change in status, stable for surgery.  I have reviewed the patient's chart and labs.  Questions were answered to the patient's satisfaction.     Aimee Lawson

## 2023-01-31 NOTE — Anesthesia Procedure Notes (Signed)
Procedure Name: Intubation Date/Time: 01/31/2023 7:42 AM  Performed by: Merlene Pulling, CRNAPre-anesthesia Checklist: Patient identified, Patient being monitored, Timeout performed, Emergency Drugs available and Suction available Patient Re-evaluated:Patient Re-evaluated prior to induction Oxygen Delivery Method: Circle system utilized Preoxygenation: Pre-oxygenation with 100% oxygen Induction Type: IV induction Ventilation: Mask ventilation without difficulty Laryngoscope Size: 3 and McGraph Grade View: Grade I Tube type: Oral Tube size: 7.0 mm Number of attempts: 1 Airway Equipment and Method: Stylet Placement Confirmation: ETT inserted through vocal cords under direct vision, positive ETCO2 and breath sounds checked- equal and bilateral Secured at: 21 cm Tube secured with: Tape Dental Injury: Teeth and Oropharynx as per pre-operative assessment

## 2023-01-31 NOTE — Anesthesia Postprocedure Evaluation (Signed)
Anesthesia Post Note  Patient: MISAO FACKRELL  Procedure(s) Performed: XI ROBOTIC ASSISTED LAPAROSCOPIC CHOLECYSTECTOMY INDOCYANINE GREEN FLUORESCENCE IMAGING (ICG)  Patient location during evaluation: PACU Anesthesia Type: General Level of consciousness: awake and awake and alert Pain management: pain level controlled Vital Signs Assessment: post-procedure vital signs reviewed and stable Respiratory status: nonlabored ventilation and spontaneous breathing Cardiovascular status: blood pressure returned to baseline Anesthetic complications: no   No notable events documented.   Last Vitals:  Vitals:   01/31/23 0939 01/31/23 0948  BP: 119/73 (!) 132/94  Pulse: 81 89  Resp: 20 16  Temp: (!) 36.2 C (!) 36.2 C  SpO2: 97% 98%    Last Pain:  Vitals:   01/31/23 0948  TempSrc: Temporal  PainSc: 0-No pain                 VAN STAVEREN,Tyrah Broers

## 2023-01-31 NOTE — Discharge Instructions (Addendum)
Discharge Instructions: 1.  Patient may shower, but do not scrub wounds heavily and dab dry only. 2.  Do not submerge wounds in pool/tub until fully healed. 3.  Do not apply ointments or hydrogen peroxide to the wounds. 4.  May apply ice packs to the wounds for comfort. 5.  No heavy lifting or pushing of more than 10-15 lbs for 4 weeks. 6.  Do not drive while taking narcotics for pain control.  Prior to driving, make sure you are able to rotate right and left to look at blindspots without significant pain or discomfort.    AMBULATORY SURGERY  DISCHARGE INSTRUCTIONS   The drugs that you were given will stay in your system until tomorrow so for the next 24 hours you should not:  Drive an automobile Make any legal decisions Drink any alcoholic beverage   You may resume regular meals tomorrow.  Today it is better to start with liquids and gradually work up to solid foods.  You may eat anything you prefer, but it is better to start with liquids, then soup and crackers, and gradually work up to solid foods.   Please notify your doctor immediately if you have any unusual bleeding, trouble breathing, redness and pain at the surgery site, drainage, fever, or pain not relieved by medication.      Additional Instructions:

## 2023-02-12 DIAGNOSIS — Z7985 Long-term (current) use of injectable non-insulin antidiabetic drugs: Secondary | ICD-10-CM | POA: Diagnosis not present

## 2023-02-12 DIAGNOSIS — E6609 Other obesity due to excess calories: Secondary | ICD-10-CM | POA: Diagnosis not present

## 2023-02-14 ENCOUNTER — Encounter: Payer: 59 | Admitting: Physician Assistant

## 2023-02-16 DIAGNOSIS — Z9049 Acquired absence of other specified parts of digestive tract: Secondary | ICD-10-CM | POA: Insufficient documentation

## 2023-02-16 NOTE — Patient Instructions (Incomplete)
Your mammogram is scheduled for 03/01/23 at 12:40 PM at [facility] at [facility].  Address: [address], [place], [place] [address] Phone: [phone]    Eating Plan for Chronic Obstructive Pulmonary Disease Chronic obstructive pulmonary disease (COPD) causes symptoms such as shortness of breath, coughing, and chest discomfort. These symptoms can make it difficult to eat enough to maintain a healthy weight. Generally, people with COPD should eat a diet that is high in calories, protein, and other nutrients to maintain body weight and to keep the lungs as healthy as possible. Depending on the medicines you take and other health conditions you may have, your health care provider may give you additional recommendations on what to eat or avoid. Talk with your health care provider about your goals for body weight, and work with a dietitian to develop an eating plan that is right for you. What are tips for following this plan? Reading food labels  Avoid foods with more than 300 milligrams (mg) of salt (sodium) per serving. Choose foods that contain at least 4 grams (g) of fiber per serving. Try to eat 20-30 g of fiber each day. Choose foods that are high in calories and protein, such as nuts, beans, yogurt, and cheese. Shopping Do not buy foods labeled as diet, low-calorie, or low-fat. If you are able to eat dairy products: Avoid low-fat or skim milk. Buy dairy products that have at least 2% fat. Buy nutritional supplement drinks. Buy grains and prepared foods labeled as enriched or fortified. Consider buying low-sodium, pre-made foods to conserve energy for eating. Cooking Add dry milk or protein powder to smoothies. Cook with healthy fats, such as olive oil, canola oil, sunflower oil, and grapeseed oil. Add oil, butter, cream cheese, or nut butters to foods to increase fat and calories. To make foods easier to chew and swallow: Cook vegetables, pasta, and  rice until soft. Cut or grind meat into very small pieces. Dip breads in liquid. Meal planning  Eat when you feel hungry. Eat 5-6 small meals throughout the day. Drink 6-8 glasses of water each day. Do not drink liquids with meals. Drink liquids at the end of the meal to avoid feeling full too quickly. Eat a variety of fruits and vegetables every day. Ask for assistance from family or friends with planning and preparing meals as needed. Avoid foods that cause you to feel bloated, such as carbonated drinks, fried foods, beans, broccoli, cabbage, and apples. For older adults, ask your local agency on aging whether you are eligible for meal assistance programs, such as Meals on Wheels. Lifestyle  Do not smoke. Eat slowly. Take small bites and chew food well before swallowing. Do not overeat. This may make it more difficult to breathe after eating. Sit up while eating. If needed, continue to use supplemental oxygen while eating. Rest or relax for 30 minutes before and after eating. Monitor your weight as told by your health care provider. Exercise as told by your health care provider. What foods should I eat? Fruits All fresh, dried, canned, or frozen fruits that do not cause gas. Vegetables All fresh, canned (no salt added), or frozen vegetables that do not cause gas. Grains Whole-grain bread. Enriched whole-grain pasta. Fortified whole-grain cereals. Fortified rice. Quinoa. Meats and other proteins Lean meat. Poultry. Fish. Dried beans. Unsalted nuts. Tofu. Eggs. Nut butters. Dairy Whole or 2% milk. Cheese. Yogurt. Fats and oils Olive oil. Canola oil. Butter. Margarine. Beverages Water. Vegetable juice (no salt  added). Decaffeinated coffee. Decaffeinated or herbal tea. Seasonings and condiments Fresh or dried herbs. Low-salt or salt-free seasonings. Low-sodium soy sauce. The items listed above may not be a complete list of foods and beverages you can eat. Contact a dietitian for  more information. What foods should I avoid? Fruits Fruits that cause gas, such as apples or melon. Vegetables Vegetables that cause gas, such as broccoli, Brussels sprouts, cabbage, cauliflower, and onions. Canned vegetables with added salt. Meats and other proteins Fried meat. Salt-cured meat. Processed meat. Dairy Fat-free or low-fat milk, yogurt, or cheese. Processed cheese. Beverages Carbonated drinks. Caffeinated drinks, such as coffee, tea, and soft drinks. Juice. Alcohol. Vegetable juice with added salt. Seasonings and condiments Salt. Seasoning mixes with salt. Soy sauce. Rosita Fire. Other foods Clear soup or broth. Fried foods. Prepared frozen meals. The items listed above may not be a complete list of foods and beverages you should avoid. Contact a dietitian for more information. Summary COPD symptoms can make it difficult to eat enough to maintain a healthy weight. A COPD eating plan can help you maintain your body weight and keep your lungs as healthy as possible. Eat a diet that is high in calories, protein, and other nutrients. Read labels to make sure that you are getting the right nutrients. Cook foods to make them easier to chew and swallow. Eat 5-6 small meals throughout the day, and avoid foods that cause gas or make you feel bloated. This information is not intended to replace advice given to you by your health care provider. Make sure you discuss any questions you have with your health care provider. Document Revised: 05/03/2020 Document Reviewed: 05/03/2020 Elsevier Patient Education  2024 [employer].

## 2023-02-19 ENCOUNTER — Encounter: Payer: Self-pay | Admitting: Physician Assistant

## 2023-02-19 ENCOUNTER — Ambulatory Visit (INDEPENDENT_AMBULATORY_CARE_PROVIDER_SITE_OTHER): Payer: 59 | Admitting: Physician Assistant

## 2023-02-19 VITALS — BP 134/80 | HR 83 | Temp 97.7°F | Ht 64.0 in | Wt 177.0 lb

## 2023-02-19 DIAGNOSIS — Z09 Encounter for follow-up examination after completed treatment for conditions other than malignant neoplasm: Secondary | ICD-10-CM

## 2023-02-19 DIAGNOSIS — K8020 Calculus of gallbladder without cholecystitis without obstruction: Secondary | ICD-10-CM

## 2023-02-19 NOTE — Progress Notes (Signed)
[facility] POST-OP OFFICE VISIT  02/19/2023  HPI: Aimee Lawson is a 62 y.o. female 19 days s/p robotic assisted laparoscopic cholecystectomy for symptomatic cholelithiasis with Dr Aleen Campi   She has done very well No issues with pain; only needed two ibuprofen No fever, chills, nausea, emesis She is tolerating PO without issue; no diarrhea Incisions are well healed No other complaints   Vital signs: BP 134/80   Pulse 83   Temp 97.7 F (36.5 C) (Oral)   Ht 5\' 4"  (1.626 m)   Wt 177 lb (80.3 kg)   LMP 09/07/2010 (Approximate)   SpO2 96%   BMI 30.38 kg/m    Physical Exam: Constitutional: Well appearing female, NAD Abdomen: Soft, non-tender, non-distended, no rebound/guarding Skin: Laparoscopic incisions are healing well, no erythema or drainage   Assessment/Plan: This is a 62 y.o. female 19 days s/p robotic assisted laparoscopic cholecystectomy for symptomatic cholelithiasis with Dr Aleen Campi    - Pain control prn  - Reviewed wound care recommendation  - Reviewed lifting restrictions; 4 weeks total  - Reviewed surgical pathology; CCC  - She can follow up on as needed basis; She understands to call with questions/concerns  -- Lynden Oxford, PA-C [facility] 02/19/2023, 4:06 PM M-F: 7am - 4pm

## 2023-02-19 NOTE — Patient Instructions (Signed)

## 2023-02-20 ENCOUNTER — Encounter: Payer: Self-pay | Admitting: Nurse Practitioner

## 2023-02-20 ENCOUNTER — Ambulatory Visit (INDEPENDENT_AMBULATORY_CARE_PROVIDER_SITE_OTHER): Payer: 59 | Admitting: Nurse Practitioner

## 2023-02-20 VITALS — BP 113/72 | HR 90 | Temp 98.0°F | Ht 64.0 in | Wt 178.0 lb

## 2023-02-20 DIAGNOSIS — F17210 Nicotine dependence, cigarettes, uncomplicated: Secondary | ICD-10-CM

## 2023-02-20 DIAGNOSIS — J432 Centrilobular emphysema: Secondary | ICD-10-CM

## 2023-02-20 DIAGNOSIS — E782 Mixed hyperlipidemia: Secondary | ICD-10-CM

## 2023-02-20 DIAGNOSIS — Z1231 Encounter for screening mammogram for malignant neoplasm of breast: Secondary | ICD-10-CM

## 2023-02-20 DIAGNOSIS — R0989 Other specified symptoms and signs involving the circulatory and respiratory systems: Secondary | ICD-10-CM | POA: Diagnosis not present

## 2023-02-20 DIAGNOSIS — I700 Atherosclerosis of aorta: Secondary | ICD-10-CM

## 2023-02-20 DIAGNOSIS — Z0000 Encounter for general adult medical examination without abnormal findings: Secondary | ICD-10-CM | POA: Diagnosis not present

## 2023-02-20 DIAGNOSIS — E538 Deficiency of other specified B group vitamins: Secondary | ICD-10-CM

## 2023-02-20 MED ORDER — ATORVASTATIN CALCIUM 20 MG PO TABS: 20.0000 mg | ORAL_TABLET | Freq: Every day | ORAL | 4 refills | Status: DC

## 2023-02-20 MED ORDER — PREDNISONE 20 MG PO TABS: 40.0000 mg | ORAL_TABLET | Freq: Every day | ORAL | 0 refills | Status: AC

## 2023-02-20 MED ORDER — AZITHROMYCIN 250 MG PO TABS: ORAL_TABLET | ORAL | 0 refills | Status: AC

## 2023-02-20 MED ORDER — ANORO ELLIPTA 62.5-25 MCG/ACT IN AEPB: INHALATION_SPRAY | RESPIRATORY_TRACT | 4 refills | Status: DC

## 2023-02-20 MED ORDER — ALBUTEROL SULFATE HFA 108 (90 BASE) MCG/ACT IN AERS: 2.0000 | INHALATION_SPRAY | Freq: Four times a day (QID) | RESPIRATORY_TRACT | 4 refills | Status: DC | PRN

## 2023-02-20 NOTE — Assessment & Plan Note (Signed)
Chronic.  Noted on lung CT screening.  Continue statin and ASA daily for prevention purposes.  Recommend complete cessation of smoking.

## 2023-02-20 NOTE — Assessment & Plan Note (Signed)
Noted on past labs -- takes supplement sporadically, recommend she take consistently.  Recheck level today.

## 2023-02-20 NOTE — Assessment & Plan Note (Signed)
Ongoing and stable.  Continue Atorvastatin 10 MG daily.  Check lipid panel and CMP today.  Recommend diet changes and modest loss.

## 2023-02-20 NOTE — Assessment & Plan Note (Signed)
<  50% stenosis on imaging.  Follow-up with vascular PRN or is symptomatic, plan to recheck imaging in 5-6 years.  Recommend complete cessation of smoking. 

## 2023-02-20 NOTE — Assessment & Plan Note (Signed)
Chronic, ongoing, noted on screening.  Recommend complete cessation of smoking. Continue Anoro daily and Albuterol PRN.  Continue annual lung screening.  Zpack and Prednisone sent for her upcoming [place] to use as needed if illness presents.

## 2023-02-20 NOTE — Assessment & Plan Note (Signed)
I have recommended complete cessation of tobacco use. I have discussed various options available for assistance with tobacco cessation including over the counter methods (Nicotine gum, patch and lozenges). We also discussed prescription options (Chantix, Nicotine Inhaler / Nasal Spray). The patient is not interested in pursuing any prescription tobacco cessation options at this time.  

## 2023-02-20 NOTE — Progress Notes (Signed)
BP 113/72   Pulse 90   Temp 98 F (36.7 C) (Oral)   Ht 5\' 4"  (1.626 m)   Wt 178 lb (80.7 kg)   LMP 09/07/2010 (Approximate)   SpO2 98%   BMI 30.55 kg/m    Subjective:    Patient ID: Aimee Lawson, female    DOB: 03/30/1961, 62 y.o.   MRN: [id number]  HPI: Aimee Lawson is a 62 y.o. female presenting on 02/20/2023 for comprehensive medical examination. Current medical complaints include:none  She currently lives with: husband Menopausal Symptoms: no  Seeing Camera operator and obtaining [phone] and B12 injections for weight loss.  COPD Currently treated with Anoro daily and Albuterol.  Attends lung screening with last done 03/06/22, noting centrilobular emphysema and aortic atherosclerosis. She is smoking 1 PPD still, sometimes less and sometimes more.  Has been smoking since age 65.   COPD status: controlled Satisfied with current treatment?: yes Oxygen use: no Dyspnea frequency: no Cough frequency: no Rescue inhaler frequency:   Limitation of activity: no Productive cough: none Last Spirometry: 08/22/22 Pneumovax: Up to Date Influenza: Up to Date  HYPERLIPIDEMIA Continues on Atorvastatin daily.  Had visit with Dr. Gilda Crease in 2021 for bilateral bruits noted.  Imaging showed <50% stenosis, comparable to previous studies -- no intervention.  To return to vascular in 5 years. Hyperlipidemia status: good compliance Satisfied with current treatment?  yes Side effects:  no Medication compliance: good compliance Supplements: none Aspirin:  no The 10-year ASCVD risk score (Arnett DK, et al., 2019) is: 5.6%   Values used to calculate the score:     Age: 27 years     Sex: Female     Is Non-Hispanic African American: No     Diabetic: No     Tobacco smoker: Yes     Systolic Blood Pressure: 113 mmHg     Is BP treated: No     HDL Cholesterol: 67 mg/dL     Total Cholesterol: 141 mg/dL Chest pain:  no Coronary artery disease:  no Family history CAD:   yes Family history early CAD:  no   Depression Screen done today and results listed below:     02/20/2023   10:06 AM 08/22/2022    2:25 PM 01/22/2022    1:14 PM 11/22/2021    2:32 PM 10/04/2020   10:52 AM  Depression screen PHQ 2/9  Decreased Interest 0 0 0 0 0  Down, Depressed, Hopeless 0 0 0 0 0  PHQ - 2 Score 0 0 0 0 0  Altered sleeping 0 0 0 0 0  Tired, decreased energy 2 0 1 0 1  Change in appetite 2 0 2 0 0  Feeling bad or failure about yourself  0 0 0 0 0  Trouble concentrating 0 0 0 0 0  Moving slowly or fidgety/restless 0 0 0 0 0  Suicidal thoughts 0 0 0 0 0  PHQ-9 Score 4 0 3 0 1  Difficult doing work/chores Not difficult at all Not difficult at all Not difficult at all        02/20/2023   10:07 AM 08/22/2022    2:25 PM 01/22/2022    1:15 PM 11/22/2021    2:32 PM  GAD 7 : Generalized Anxiety Score  Nervous, Anxious, on Edge 0 0 0 0  Control/stop worrying 0 0 0 0  Worry too much - different things 0 0 0 0  Trouble relaxing 0 0 0 0  Restless 0 0 0 0  Easily annoyed or irritable 0 0 0 0  Afraid - awful might happen 0 0 0 0  Total GAD 7 Score 0 0 0 0  Anxiety Difficulty Not difficult at all Not difficult at all Not difficult at all Not difficult at all      02/21/2021    7:43 AM 11/22/2021    2:32 PM 01/22/2022    1:13 PM 08/22/2022    2:25 PM 02/20/2023   10:06 AM  Fall Risk  Falls in the past year?  0 0 0 0  Was there an injury with Fall?  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   Aortic atherosclerosis (HCC)    Atypical mole 11/04/2013   Right superior lateral pubic    Emphysema lung (HCC)    Emphysema of lung (HCC) 2022   Mixed hyperlipidemia    Squamous cell carcinoma of skin 10/31/2017   right pretibial    Symptomatic cholelithiasis     Surgical History:  Past Surgical History:  Procedure Laterality Date   AUGMENTATION MAMMAPLASTY     BREAST ENHANCEMENT SURGERY     20+ yrs ago   BREAST SURGERY     CHOLECYSTECTOMY  7/24   COLONOSCOPY N/A 02/21/2021   Procedure: COLONOSCOPY;  Surgeon: Wyline Mood, MD;  Location: [facility] ENDOSCOPY;  Service: Gastroenterology;  Laterality: N/A;   COSMETIC SURGERY  2001   breast augmentation   FACIAL COSMETIC SURGERY      Medications:  No current outpatient medications on file prior to visit.   No current facility-administered medications on file prior to visit.    Allergies:  Allergies  Allergen Reactions   Penicillins Anaphylaxis    Social History:  Social History   Socioeconomic History   Marital status: Single    Spouse name: Not on file   Number of children: Not on file   Years of education: Not on file   Highest education level: Not on file  Occupational History   Not on file  Tobacco Use   Smoking status: Every Day    Current packs/day: 0.50    Average packs/day: 0.7 packs/day for 66.0 years (44.0 ttl pk-yrs)    Types: Cigarettes    Passive exposure: Past   Smokeless tobacco: Never  Vaping Use   Vaping status: Never Used  Substance and Sexual Activity   Alcohol use: Yes    Alcohol/week: 30.0 standard drinks of alcohol    Types: 30 Cans of beer per week    Comment: only 3 a week not 30 but won't change   Drug use: Never   Sexual activity: Not Currently    Birth control/protection: None  Other Topics Concern   Not on file  Social History Narrative   Not on file   Social Determinants of Health   Financial Resource Strain: Low Risk  (09/29/2019)   Overall Financial Resource Strain (CARDIA)    Difficulty of Paying Living Expenses: Not hard at all  Food  Insecurity: No Food  Insecurity (09/29/2019)   Hunger Vital Sign    Worried About Running Out of Food in the Last Year: Never true    Ran Out of Food in the Last Year: Never true  Transportation Needs: No Transportation Needs (09/29/2019)   PRAPARE - Administrator, Civil Service (Medical): No    Lack of Transportation (Non-Medical): No  Physical Activity: Sufficiently Active (09/29/2019)   Exercise Vital Sign    Days of Exercise per Week: 5 days    Minutes of Exercise per Session: 30 min  Stress: No Stress Concern Present (09/29/2019)   [employer] of Occupational Health - Occupational Stress Questionnaire    Feeling of Stress : Not at all  Social Connections: Unknown (09/29/2019)   Social Connection and Isolation Panel [NHANES]    Frequency of Communication with Friends and Family: Three times a week    Frequency of Social Gatherings with Friends and Family: Three times a week    Attends Religious Services: Never    Active Member of Clubs or Organizations: No    Attends Engineer, structural: Never    Marital Status: Not on file  Intimate Partner Violence: Not on file   Social History   Tobacco Use  Smoking Status Every Day   Current packs/day: 0.50   Average packs/day: 0.7 packs/day for 66.0 years (44.0 ttl pk-yrs)   Types: Cigarettes   Passive exposure: Past  Smokeless Tobacco Never   Social History   Substance and Sexual Activity  Alcohol Use Yes   Alcohol/week: 30.0 standard drinks of alcohol   Types: 30 Cans of beer per week   Comment: only 3 a week not 30 but won't change    Family History:  Family History  Problem Relation Age of Onset   Heart attack Mother    Stroke Mother    Uterine cancer Mother    Colon cancer Mother    Cancer Mother    Obesity Mother    Heart attack Father    Hypertension Brother    Other Brother        Pacemaker   Emphysema Brother    COPD Brother    Diabetes Maternal Grandmother    Heart disease Maternal Grandmother    Heart  disease Maternal Grandfather    Heart disease Paternal Grandmother    Heart disease Paternal Grandfather     Past medical history, surgical history, medications, allergies, family history and social history reviewed with patient today and changes made to appropriate areas of the chart.   ROS All other ROS negative except what is listed above and in the HPI.      Objective:    BP 113/72   Pulse 90   Temp 98 F (36.7 C) (Oral)   Ht 5\' 4"  (1.626 m)   Wt 178 lb (80.7 kg)   LMP 09/07/2010 (Approximate)   SpO2 98%   BMI 30.55 kg/m   Wt Readings from Last 3 Encounters:  02/20/23 178 lb (80.7 kg)  02/19/23 177 lb (80.3 kg)  01/31/23 180 lb (81.6 kg)    Physical Exam Vitals and nursing note reviewed. Exam conducted with a chaperone present.  Constitutional:      General: She is awake. She is not in acute distress.    Appearance: She is well-developed and well-groomed. She is not ill-appearing or toxic-appearing.  HENT:     Head: Normocephalic and atraumatic.     Right Ear: Hearing, tympanic membrane, ear canal  and external ear normal. No drainage.     Left Ear: Hearing, tympanic membrane, ear canal and external ear normal. No drainage.     Nose: Nose normal.     Right Sinus: No maxillary sinus tenderness or frontal sinus tenderness.     Left Sinus: No maxillary sinus tenderness or frontal sinus tenderness.     Mouth/Throat:     Mouth: Mucous membranes are moist.     Pharynx: Oropharynx is clear. Uvula midline. No pharyngeal swelling, oropharyngeal exudate or posterior oropharyngeal erythema.  Eyes:     General: Lids are normal.        Right eye: No discharge.        Left eye: No discharge.     Extraocular Movements: Extraocular movements intact.     Conjunctiva/sclera: Conjunctivae normal.     Pupils: Pupils are equal, round, and reactive to light.     Visual Fields: Right eye visual fields normal and left eye visual fields normal.  Neck:     Thyroid: No thyromegaly.      Vascular: Carotid bruit (mild bilateral) present.     Trachea: Trachea normal.  Cardiovascular:     Rate and Rhythm: Normal rate and regular rhythm.     Heart sounds: Normal heart sounds. No murmur heard.    No gallop.  Pulmonary:     Effort: Pulmonary effort is normal. No accessory muscle usage or respiratory distress.     Breath sounds: Normal breath sounds.  Chest:  Breasts:    Right: Normal.     Left: Normal.  Abdominal:     General: Bowel sounds are normal.     Palpations: Abdomen is soft. There is no hepatomegaly or splenomegaly.     Tenderness: There is no abdominal tenderness.  Musculoskeletal:        General: Normal range of motion.     Cervical back: Normal range of motion and neck supple.     Right lower leg: No edema.     Left lower leg: No edema.  Lymphadenopathy:     Head:     Right side of head: No submental, submandibular, tonsillar, preauricular or posterior auricular adenopathy.     Left side of head: No submental, submandibular, tonsillar, preauricular or posterior auricular adenopathy.     Cervical: No cervical adenopathy.     Upper Body:     Right upper body: No supraclavicular, axillary or pectoral adenopathy.     Left upper body: No supraclavicular, axillary or pectoral adenopathy.  Skin:    General: Skin is warm and dry.     Capillary Refill: Capillary refill takes less than 2 seconds.     Findings: No rash.  Neurological:     Mental Status: She is alert and oriented to person, place, and time.     Gait: Gait is intact.     Deep Tendon Reflexes: Reflexes are normal and symmetric.     Reflex Scores:      Brachioradialis reflexes are 2+ on the right side and 2+ on the left side.      Patellar reflexes are 2+ on the right side and 2+ on the left side. Psychiatric:        Attention and Perception: Attention normal.        Mood and Affect: Mood normal.        Speech: Speech normal.        Behavior: Behavior normal. Behavior is cooperative.         Thought  Content: Thought content normal.        Judgment: Judgment normal.    Results for orders placed or performed during the hospital encounter of 01/28/23  Comprehensive metabolic panel  Result Value Ref Range   Sodium 141 135 - 145 mmol/L   Potassium 3.6 3.5 - 5.1 mmol/L   Chloride 108 98 - 111 mmol/L   CO2 25 22 - 32 mmol/L   Glucose, Bld 125 (H) 70 - 99 mg/dL   BUN 18 8 - 23 mg/dL   Creatinine, Ser 2.95 0.44 - 1.00 mg/dL   Calcium 8.8 (L) 8.9 - 10.3 mg/dL   Total Protein 6.7 6.5 - 8.1 g/dL   Albumin 4.1 3.5 - 5.0 g/dL   AST 15 15 - 41 U/L   ALT 17 0 - 44 U/L   Alkaline Phosphatase 92 38 - 126 U/L   Total Bilirubin 0.6 0.3 - 1.2 mg/dL   GFR, Estimated >62 >13 mL/min   Anion gap 8 5 - 15  CBC with Differential/Platelet  Result Value Ref Range   WBC 5.7 4.0 - 10.5 K/uL   RBC 4.23 3.87 - 5.11 MIL/uL   Hemoglobin 13.3 12.0 - 15.0 g/dL   HCT 08.6 57.8 - 46.9 %   MCV 91.7 80.0 - 100.0 fL   MCH 31.4 26.0 - 34.0 pg   MCHC 34.3 30.0 - 36.0 g/dL   RDW 62.9 52.8 - 41.3 %   Platelets 225 150 - 400 K/uL   nRBC 0.0 0.0 - 0.2 %   Neutrophils Relative % 51 %   Neutro Abs 2.9 1.7 - 7.7 K/uL   Lymphocytes Relative 39 %   Lymphs Abs 2.2 0.7 - 4.0 K/uL   Monocytes Relative 6 %   Monocytes Absolute 0.3 0.1 - 1.0 K/uL   Eosinophils Relative 3 %   Eosinophils Absolute 0.1 0.0 - 0.5 K/uL   Basophils Relative 1 %   Basophils Absolute 0.0 0.0 - 0.1 K/uL   Immature Granulocytes 0 %   Abs Immature Granulocytes 0.02 0.00 - 0.07 K/uL      Assessment & Plan:   Problem List Items Addressed This Visit       Cardiovascular and Mediastinum   Aortic atherosclerosis (HCC)    Chronic.  Noted on lung CT screening.  Continue statin and ASA daily for prevention purposes.  Recommend complete cessation of smoking.      Relevant Medications   atorvastatin (LIPITOR) 20 MG tablet   Other Relevant Orders   Comprehensive metabolic panel   Lipid Panel w/o Chol/HDL Ratio     Respiratory    Centrilobular emphysema (HCC) - Primary    Chronic, ongoing, noted on screening.  Recommend complete cessation of smoking. Continue Anoro daily and Albuterol PRN.  Continue annual lung screening.  Zpack and Prednisone sent for her upcoming travels [place] to use as needed if illness presents.      Relevant Medications   albuterol (VENTOLIN HFA) 108 (90 Base) MCG/ACT inhaler   umeclidinium-vilanterol (ANORO ELLIPTA) 62.5-25 MCG/ACT AEPB   azithromycin (ZITHROMAX) 250 MG tablet   predniSONE (DELTASONE) 20 MG tablet   Other Relevant Orders   TSH     Other   B12 deficiency   Relevant Orders   CBC with Differential/Platelet   Vitamin B12   Bilateral carotid bruits    <50% stenosis on imaging.  Follow-up with vascular PRN or is symptomatic, plan to recheck imaging in 5-6 years.  Recommend complete cessation of smoking.  Cigarette nicotine dependence without complication    I have recommended complete cessation of tobacco use. I have discussed various options available for assistance with tobacco cessation including over the counter methods (Nicotine gum, patch and lozenges). We also discussed prescription options (Chantix, Nicotine Inhaler / Nasal Spray). The patient is not interested in pursuing any prescription tobacco cessation options at this time.       Hyperlipemia    Ongoing and stable.  Continue Atorvastatin 10 MG daily.  Check lipid panel and CMP today.  Recommend diet changes and modest loss.       Relevant Medications   atorvastatin (LIPITOR) 20 MG tablet   Other Relevant Orders   Comprehensive metabolic panel   Lipid Panel w/o Chol/HDL Ratio   Other Visit Diagnoses     Encounter for screening mammogram for malignant neoplasm of breast       Mammogram ordered today and instructed on how to schedule.   Relevant Orders   MM 3D SCREEN BREAST W/IMPLANT BILATERAL   Encounter for annual physical exam       Annual physical today with labs and health maintenance reviewed,  discussed with patient.        Follow up plan: Return in about 6 months (around 08/23/2023) for COPD and HLD.   LABORATORY TESTING:  - Pap smear: up to date  IMMUNIZATIONS:   - Tdap: Tetanus vaccination status reviewed: last tetanus booster within 10 years. - Influenza: Up to date - Pneumovax: Up to date - Prevnar: Obtain next year - COVID: Up to date - HPV: Not applicable - Shingrix vaccine: Up to date  SCREENING: -Mammogram: Up to date  - Colonoscopy: Up to date  - Bone Density: Not applicable  -Hearing Test: Not applicable  -Spirometry: Up To Date  PATIENT COUNSELING:   Advised to take 1 mg of folate supplement per day if capable of pregnancy.   Sexuality: Discussed sexually transmitted diseases, partner selection, use of condoms, avoidance of unintended pregnancy  and contraceptive alternatives.   Advised to avoid cigarette smoking.  I discussed with the patient that most people either abstain from alcohol or drink within safe limits (<=14/week and <=4 drinks/occasion for males, <=7/weeks and <= 3 drinks/occasion for females) and that the risk for alcohol disorders and other health effects rises proportionally with the number of drinks per week and how often a drinker exceeds daily limits.  Discussed cessation/primary prevention of drug use and availability of treatment for abuse.   Diet: Encouraged to adjust caloric intake to maintain  or achieve ideal body weight, to reduce intake of dietary saturated fat and total fat, to limit sodium intake by avoiding high sodium foods and not adding table salt, and to maintain adequate dietary potassium and calcium preferably from fresh fruits, vegetables, and low-fat dairy products.    Stressed the importance of regular exercise  Injury prevention: Discussed safety belts, safety helmets, smoke detector, smoking near bedding or upholstery.   Dental health: Discussed importance of regular tooth brushing, flossing, and dental  visits.    NEXT PREVENTATIVE PHYSICAL DUE IN 1 YEAR. Return in about 6 months (around 08/23/2023) for COPD and HLD.

## 2023-02-21 LAB — CBC WITH DIFFERENTIAL/PLATELET
Basophils Absolute: 0 10*3/uL (ref 0.0–0.2)
Basos: 1 %
EOS (ABSOLUTE): 0.2 10*3/uL (ref 0.0–0.4)
Eos: 2 %
Hematocrit: 37.7 % (ref 34.0–46.6)
Hemoglobin: 12.8 g/dL (ref 11.1–15.9)
Immature Grans (Abs): 0 10*3/uL (ref 0.0–0.1)
Immature Granulocytes: 0 %
Lymphocytes Absolute: 2.5 10*3/uL (ref 0.7–3.1)
Lymphs: 40 %
MCH: 31.1 pg (ref 26.6–33.0)
MCHC: 34 g/dL (ref 31.5–35.7)
MCV: 92 fL (ref 79–97)
Monocytes Absolute: 0.5 10*3/uL (ref 0.1–0.9)
Monocytes: 8 %
Neutrophils Absolute: 3.1 10*3/uL (ref 1.4–7.0)
Neutrophils: 49 %
Platelets: 237 10*3/uL (ref 150–450)
RBC: 4.12 x10E6/uL (ref 3.77–5.28)
RDW: 11.8 % (ref 11.7–15.4)
WBC: 6.3 10*3/uL (ref 3.4–10.8)

## 2023-02-21 LAB — COMPREHENSIVE METABOLIC PANEL
ALT: 11 IU/L (ref 0–32)
AST: 14 IU/L (ref 0–40)
Albumin: 4.3 g/dL (ref 3.9–4.9)
Alkaline Phosphatase: 123 IU/L — ABNORMAL HIGH (ref 44–121)
BUN/Creatinine Ratio: 22 (ref 12–28)
BUN: 21 mg/dL (ref 8–27)
Bilirubin Total: 0.5 mg/dL (ref 0.0–1.2)
CO2: 23 mmol/L (ref 20–29)
Calcium: 9.5 mg/dL (ref 8.7–10.3)
Chloride: 104 mmol/L (ref 96–106)
Creatinine, Ser: 0.95 mg/dL (ref 0.57–1.00)
Globulin, Total: 2.1 g/dL (ref 1.5–4.5)
Glucose: 83 mg/dL (ref 70–99)
Potassium: 4.7 mmol/L (ref 3.5–5.2)
Sodium: 142 mmol/L (ref 134–144)
Total Protein: 6.4 g/dL (ref 6.0–8.5)
eGFR: 67 mL/min/{1.73_m2} (ref >59)

## 2023-02-21 LAB — TSH: TSH: 1.86 u[IU]/mL (ref 0.450–4.500)

## 2023-02-21 LAB — LIPID PANEL W/O CHOL/HDL RATIO
Cholesterol, Total: 156 mg/dL (ref 100–199)
HDL: 63 mg/dL (ref >39)
LDL Chol Calc (NIH): 76 mg/dL (ref 0–99)
Triglycerides: 94 mg/dL (ref 0–149)
VLDL Cholesterol Cal: 17 mg/dL (ref 5–40)

## 2023-02-21 LAB — VITAMIN B12: Vitamin B-12: >2000 pg/mL — ABNORMAL HIGH (ref 232–1245)

## 2023-02-21 NOTE — Progress Notes (Signed)
Contacted via MyChart   Good afternoon Aimee Lawson, your labs have returned and overall look good.  Kidney function, creatinine and eGFR, remains normal, as is liver function, AST and ALT.  B12 level a little high, reduce supplement if taken to every other day.  Any questions? Keep being amazing!!  Thank you for allowing me to participate in your care.  I appreciate you. Kindest regards, Ryenne Lynam

## 2023-02-22 ENCOUNTER — Encounter: Payer: Self-pay | Admitting: Surgery

## 2023-02-24 ENCOUNTER — Encounter: Payer: Self-pay | Admitting: Nurse Practitioner

## 2023-02-24 DIAGNOSIS — R0683 Snoring: Secondary | ICD-10-CM

## 2023-03-01 ENCOUNTER — Ambulatory Visit
Admission: RE | Admit: 2023-03-01 | Discharge: 2023-03-01 | Disposition: A | Discharge status: Home / Self Care | Payer: 59 | Source: Ambulatory Visit | Attending: Nurse Practitioner | Admitting: Nurse Practitioner

## 2023-03-01 DIAGNOSIS — Z1231 Encounter for screening mammogram for malignant neoplasm of breast: Secondary | ICD-10-CM | POA: Diagnosis not present

## 2023-03-05 NOTE — Progress Notes (Signed)
Contacted via MyChart   Normal mammogram, may repeat in one year:)

## 2023-03-07 ENCOUNTER — Ambulatory Visit (INDEPENDENT_AMBULATORY_CARE_PROVIDER_SITE_OTHER): Payer: 59 | Admitting: Vascular Surgery

## 2023-03-07 ENCOUNTER — Ambulatory Visit (INDEPENDENT_AMBULATORY_CARE_PROVIDER_SITE_OTHER): Payer: 59 | Admitting: Nurse Practitioner

## 2023-03-07 ENCOUNTER — Encounter (INDEPENDENT_AMBULATORY_CARE_PROVIDER_SITE_OTHER): Payer: 59

## 2023-03-07 DIAGNOSIS — L298 Other pruritus: Secondary | ICD-10-CM | POA: Diagnosis not present

## 2023-03-07 DIAGNOSIS — L821 Other seborrheic keratosis: Secondary | ICD-10-CM | POA: Diagnosis not present

## 2023-03-07 DIAGNOSIS — L570 Actinic keratosis: Secondary | ICD-10-CM | POA: Diagnosis not present

## 2023-03-12 ENCOUNTER — Ambulatory Visit
Admission: RE | Admit: 2023-03-12 | Discharge: 2023-03-12 | Disposition: A | Discharge status: Home / Self Care | Payer: 59 | Source: Ambulatory Visit | Attending: Nurse Practitioner | Admitting: Nurse Practitioner

## 2023-03-12 DIAGNOSIS — Z87891 Personal history of nicotine dependence: Secondary | ICD-10-CM | POA: Diagnosis not present

## 2023-03-12 DIAGNOSIS — Z122 Encounter for screening for malignant neoplasm of respiratory organs: Secondary | ICD-10-CM

## 2023-03-12 DIAGNOSIS — F17210 Nicotine dependence, cigarettes, uncomplicated: Secondary | ICD-10-CM

## 2023-03-22 ENCOUNTER — Other Ambulatory Visit: Payer: Self-pay | Admitting: Acute Care

## 2023-03-22 DIAGNOSIS — Z122 Encounter for screening for malignant neoplasm of respiratory organs: Secondary | ICD-10-CM

## 2023-03-22 DIAGNOSIS — Z87891 Personal history of nicotine dependence: Secondary | ICD-10-CM

## 2023-04-08 ENCOUNTER — Institutional Professional Consult (permissible substitution): Payer: 59 | Admitting: Neurology

## 2023-04-22 ENCOUNTER — Encounter: Payer: Self-pay | Admitting: Neurology

## 2023-04-22 ENCOUNTER — Ambulatory Visit: Payer: 59 | Admitting: Neurology

## 2023-04-22 VITALS — BP 136/76 | HR 76 | Ht 63.0 in | Wt 175.0 lb

## 2023-04-22 DIAGNOSIS — E66811 Obesity, class 1: Secondary | ICD-10-CM

## 2023-04-22 DIAGNOSIS — J438 Other emphysema: Secondary | ICD-10-CM

## 2023-04-22 DIAGNOSIS — G4719 Other hypersomnia: Secondary | ICD-10-CM

## 2023-04-22 DIAGNOSIS — F17200 Nicotine dependence, unspecified, uncomplicated: Secondary | ICD-10-CM

## 2023-04-22 DIAGNOSIS — Z9189 Other specified personal risk factors, not elsewhere classified: Secondary | ICD-10-CM | POA: Diagnosis not present

## 2023-04-22 DIAGNOSIS — R351 Nocturia: Secondary | ICD-10-CM

## 2023-04-22 DIAGNOSIS — R0683 Snoring: Secondary | ICD-10-CM | POA: Diagnosis not present

## 2023-04-22 NOTE — Patient Instructions (Signed)
Thank you for choosing [facility] for your sleep related care! It was nice to meet you today!   Here is what we discussed today:    Based on your symptoms and your exam I believe you are at risk for obstructive sleep apnea (aka OSA). We should proceed with a sleep study to determine whether you do or do not have OSA and how severe it is. Even, if you have mild OSA, I may want you to consider treatment with CPAP, as treatment of even borderline or mild sleep apnea can result and improvement of symptoms such as sleep disruption, daytime sleepiness, nighttime bathroom breaks, restless leg symptoms, improvement of headache syndromes, even improved mood disorder.   As explained, an attended sleep study (meaning you get to stay overnight in the sleep lab), lets [place] monitor sleep-related behaviors such as sleep talking and leg movements in sleep, in addition to monitoring for sleep apnea.  A home sleep test is a screening tool for sleep apnea diagnosis only, but unfortunately, does not help with any other sleep-related diagnoses.  Please remember, the long-term risks and ramifications of untreated moderate to severe obstructive sleep apnea may include (but are not limited to): increased risk for cardiovascular disease, including congestive heart failure, stroke, difficult to control hypertension, treatment resistant obesity, arrhythmias, especially irregular heartbeat commonly known as A. Fib. (atrial fibrillation); even type 2 diabetes has been linked to untreated OSA.   Other correlations that untreated obstructive sleep apnea include macular edema which is swelling of the retina in the eyes, droopy eyelid syndrome, and elevated hemoglobin and hematocrit levels (often referred to as polycythemia).  Sleep apnea can cause disruption of sleep and sleep deprivation in most cases, which, in turn, can cause recurrent headaches, problems with memory, mood, concentration, focus, and vigilance. Most  people with untreated sleep apnea report excessive daytime sleepiness, which can affect their ability to drive. Please do not drive or use heavy equipment or machinery, if you feel sleepy! Patients with sleep apnea can also develop difficulty initiating and maintaining sleep (aka insomnia).   Having sleep apnea may increase your risk for other sleep disorders, including involuntary behaviors sleep such as sleep terrors, sleep talking, sleepwalking.    Having sleep apnea can also increase your risk for restless leg syndrome and leg movements at night.   Please note that untreated obstructive sleep apnea may carry additional perioperative morbidity. Patients with significant obstructive sleep apnea (typically, in the moderate to severe degree) should receive, if possible, perioperative PAP (positive airway pressure) therapy and the surgeons and particularly the anesthesiologists should be informed of the diagnosis and the severity of the sleep disordered breathing.   We will call you or email you through MyChart with regards to your test results and plan a follow-up in sleep clinic accordingly. Most likely, you will hear from one of our nurses.   Our sleep lab administrative assistant will call you to schedule your sleep study and give you further instructions, regarding the check in process for the sleep study, arrival time, what to bring, when you can expect to leave after the study, etc., and to answer any other logistical questions you may have. If you don't hear back from her by about 2 weeks from now, please feel free to call her direct line at [phone] or you can call our general clinic number, or email [place] through My Chart.

## 2023-04-22 NOTE — Progress Notes (Signed)
Subjective:    Patient ID: Aimee Lawson is a 62 y.o. female.  HPI    Huston Foley, MD, PhD [facility] [address] [address] [place], [place] [address]  Dear Aimee Lawson,  I saw your patient, Aimee Lawson, upon your kind request in my sleep clinic today for initial consultation of her sleep disorder, in particular, concern for underlying obstructive sleep apnea.  The patient is unaccompanied today.  As you know, Aimee Lawson is a 62 year old female with an underlying medical history of lung emphysema, smoking, hyperlipidemia, B12 deficiency, aortic atherosclerosis, squamous cell cancer of the skin, allergies and mild obesity, who reports snoring and excessive daytime somnolence.  Her Epworth sleepiness score is 7 out of 24, fatigue severity score is 23 out of 63. I reviewed your office note from 02/20/2023.  She was advised by a friend that she snores loudly.  She is single, she lives alone, she has 1 grown daughter.  She is not aware of any family history of sleep apnea.  She smokes a pack per day, has cut back from 2 packs/day but has not been able to quit completely or consistently.  She drinks caffeine in the form of diet soda, tea, and coffee.  She drinks several servings per day, typically 3 cups of coffee and 32 ounce serving of tea on an average day and some diet soda.  She has no recurrent nocturnal or morning headaches.  She has nocturia about once per average night.  Bedtime is around 10 PM and rise time can be as early as 3:30 AM or 5 AM.  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  She drinks alcohol in the form of beer, typically on the weekends or on her days off, typically 3 or 4 beers for total of about 7 beers per week.   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   Aortic atherosclerosis (HCC)    Atypical mole  11/04/2013   Right superior lateral pubic   Emphysema lung (HCC)    Emphysema of lung (HCC) 2022   Mixed hyperlipidemia    Squamous cell carcinoma of skin 10/31/2017   right pretibial    Symptomatic cholelithiasis     Her Past Surgical History Is Significant For: Past Surgical History:  Procedure Laterality Date   AUGMENTATION MAMMAPLASTY     BREAST ENHANCEMENT SURGERY     20+ yrs ago   BREAST SURGERY     CHOLECYSTECTOMY  7/24   COLONOSCOPY N/A 02/21/2021   Procedure: COLONOSCOPY;  Surgeon: Wyline Mood, MD;  Location: [facility] ENDOSCOPY;  Service: Gastroenterology;  Laterality: N/A;   COSMETIC SURGERY  2001   breast augmentation   FACIAL COSMETIC SURGERY      Her Family History Is Significant For: Family History  Problem Relation Age of Onset   Heart attack Mother    Stroke Mother    Uterine cancer Mother    Colon cancer Mother    Cancer Mother    Obesity Mother    Heart attack Father    Hypertension Brother    Other Brother        Pacemaker   Emphysema Brother    COPD Brother    Diabetes Maternal Grandmother    Heart disease Maternal Grandmother    Heart disease Maternal Grandfather    Heart disease Paternal Grandmother  Heart disease Paternal Grandfather    Sleep apnea Neg Hx     Her Social History Is Significant For: Social History   Socioeconomic History   Marital status: Single    Spouse name: Not on file   Number of children: Not on file   Years of education: Not on file   Highest education level: Not on file  Occupational History   Not on file  Tobacco Use   Smoking status: Every Day    Current packs/day: 0.50    Average packs/day: 0.7 packs/day for 66.0 years (44.0 ttl pk-yrs)    Types: Cigarettes    Passive exposure: Past   Smokeless tobacco: Never  Vaping Use   Vaping status: Never Used  Substance and Sexual Activity   Alcohol use: Yes    Alcohol/week: 7.0 standard drinks of alcohol    Types: 7 Cans of beer per week   Drug use: Never    Sexual activity: Not Currently    Birth control/protection: None  Other Topics Concern   Not on file  Social History Narrative   Not on file   Social Determinants of Health   Financial Resource Strain: Low Risk  (09/29/2019)   Overall Financial Resource Strain (CARDIA)    Difficulty of Paying Living Expenses: Not hard at all  Food Insecurity: No Food Insecurity (09/29/2019)   Hunger Vital Sign    Worried About Running Out of Food in the Last Year: Never true    Ran Out of Food in the Last Year: Never true  Transportation Needs: No Transportation Needs (09/29/2019)   PRAPARE - Administrator, Civil Service (Medical): No    Lack of Transportation (Non-Medical): No  Physical Activity: Sufficiently Active (09/29/2019)   Exercise Vital Sign    Days of Exercise per Week: 5 days    Minutes of Exercise per Session: 30 min  Stress: No Stress Concern Present (09/29/2019)   [employer] of Occupational Health - Occupational Stress Questionnaire    Feeling of Stress : Not at all  Social Connections: Unknown (09/29/2019)   Social Connection and Isolation Panel [NHANES]    Frequency of Communication with Friends and Family: Three times a week    Frequency of Social Gatherings with Friends and Family: Three times a week    Attends Religious Services: Never    Active Member of Clubs or Organizations: No    Attends Banker Meetings: Never    Marital Status: Not on file    Her Allergies Are:  Allergies  Allergen Reactions   Penicillins Anaphylaxis  :   Her Current Medications Are:  Outpatient Encounter Medications as of 04/22/2023  Medication Sig   albuterol (VENTOLIN HFA) 108 (90 Base) MCG/ACT inhaler Inhale 2 puffs into the lungs every 6 (six) hours as needed for wheezing or shortness of breath.   atorvastatin (LIPITOR) 20 MG tablet Take 1 tablet (20 mg total) by mouth daily.   umeclidinium-vilanterol (ANORO ELLIPTA) 62.5-25 MCG/ACT AEPB INHALE 1 PUFF BY MOUTH  EVERY DAY   No facility-administered encounter medications on file as of 04/22/2023.  :   Review of Systems:  Out of a complete 14 point review of systems, all are reviewed and negative with the exception of these symptoms as listed below:  Review of Systems  Neurological:        Pt here for sleep consult  Pt snores, fatigue  Pt denies headaches,hypertension,sleep study,cpap machine    ESS:7 FSS:23     Objective:  Neurological Exam  Physical Exam Physical Examination:   Vitals:   04/22/23 1330  BP: 136/76  Pulse: 76    General Examination: The patient is a very pleasant 62 y.o. female in no acute distress. She appears well-developed and well-nourished and well groomed.   HEENT: Normocephalic, atraumatic, pupils are equal, round and reactive to light, extraocular tracking is good without limitation to gaze excursion or nystagmus noted. Hearing is grossly intact. Face is symmetric with normal facial animation. Speech is clear with no dysarthria noted. There is no hypophonia. There is no lip, neck/head, jaw or voice tremor. Neck is supple with full range of passive and active motion. There are no carotid bruits on auscultation. Oropharynx exam reveals: mild mouth dryness, adequate dental hygiene with a small permanent partial bridge on top and caps on most teeth on the top.  He has mild airway crowding secondary to a smaller airway, otherwise benign findings, Mallampati class II, neck circumference 14-1/4 inches, minimal overbite.   Tongue protrudes centrally and palate elevates symmetrically.  Chest: Clear to auscultation without wheezing, rhonchi or crackles noted.  Heart: S1+S2+0, regular and normal without murmurs, rubs or gallops noted.   Abdomen: Soft, non-tender and non-distended.  Extremities: There is no pitting edema in the distal lower extremities bilaterally.   Skin: Warm and dry without trophic changes noted.   Musculoskeletal: exam reveals no obvious joint  deformities.   Neurologically:  Mental status: The patient is awake, alert and oriented in all 4 spheres. Her immediate and remote memory, attention, language skills and fund of knowledge are appropriate. There is no evidence of aphasia, agnosia, apraxia or anomia. Speech is clear with normal prosody and enunciation. Thought process is linear. Mood is normal and affect is normal.  Cranial nerves II - XII are as described above under HEENT exam.  Motor exam: Normal bulk, strength and tone is noted. There is no obvious action or resting tremor.  Fine motor skills and coordination: grossly intact.  Cerebellar testing: No dysmetria or intention tremor. There is no truncal or gait ataxia.  Sensory exam: intact to light touch in the upper and lower extremities.  Gait, station and balance: She stands easily. No veering to one side is noted. No leaning to one side is noted. Posture is age-appropriate and stance is narrow based. Gait shows normal stride length and normal pace. No problems turning are noted.   Assessment and Plan:  In summary, Aimee Lawson is a very pleasant 62 y.o.-year old female with an underlying medical history of lung emphysema, smoking, hyperlipidemia, B12 deficiency, aortic atherosclerosis, squamous cell cancer of the skin, allergies and mild obesity, whose history and physical exam are concerning for sleep disordered breathing, particularly obstructive sleep apnea (OSA).  While a laboratory attended sleep study is typically considered "gold standard" for evaluation of sleep disordered breathing, we mutually agreed to proceed with a home sleep test at this time.   I had a long chat with the patient about my findings and the diagnosis of sleep apnea, particularly OSA, its prognosis and treatment options. We talked about medical/conservative treatments, surgical interventions and non-pharmacological approaches for symptom control. I explained, in particular, the risks and ramifications  of untreated moderate to severe OSA, especially with respect to developing cardiovascular disease down the road, including congestive heart failure (CHF), difficult to treat hypertension, cardiac arrhythmias (particularly A-fib), neurovascular complications including TIA, stroke and dementia. Even type 2 diabetes has, in part, been linked to untreated OSA. Symptoms of untreated OSA  may include (but may not be limited to) daytime sleepiness, nocturia (i.e. frequent nighttime urination), memory problems, mood irritability and suboptimally controlled or worsening mood disorder such as depression and/or anxiety, lack of energy, lack of motivation, physical discomfort, as well as recurrent headaches, especially morning or nocturnal headaches. We talked about the importance of maintaining a healthy lifestyle and striving for healthy weight.  The importance of complete smoking cessation was also addressed.  In addition, we talked about the importance of striving for and maintaining good sleep hygiene. I recommended a sleep study at this time. I outlined the differences between a laboratory attended sleep study which is considered more comprehensive and accurate over the option of a home sleep test (HST); the latter may lead to underestimation of sleep disordered breathing in some instances and does not help with diagnosing upper airway resistance syndrome and is not accurate enough to diagnose primary central sleep apnea typically. I outlined possible surgical and non-surgical treatment options of OSA, including the use of a positive airway pressure (PAP) device (i.e. CPAP, AutoPAP/APAP or BiPAP in certain circumstances), a custom-made dental device (aka oral appliance, which would require a referral to a specialist dentist or orthodontist typically, and is generally speaking not considered for patients with full dentures or edentulous state), upper airway surgical options, such as traditional UPPP (which is not  considered a first-line treatment) or the Inspire device (hypoglossal nerve stimulator, which would involve a referral for consultation with an ENT surgeon, after careful selection, following inclusion criteria - also not first-line treatment). I explained the PAP treatment option to the patient in detail, as this is generally considered first-line treatment.  The patient indicated that she would be willing to try PAP therapy, if the need arises. I explained the importance of being compliant with PAP treatment, not only for insurance purposes but primarily to improve patient's symptoms symptoms, and for the patient's long term health benefit, including to reduce Her cardiovascular risks longer-term.    We will pick up our discussion about the next steps and treatment options after testing.  We will keep her posted as to the test results by phone call and/or MyChart messaging where possible.  We will plan to follow-up in sleep clinic accordingly as well.  I answered all her questions today and the patient was in agreement.   I encouraged her to call with any interim questions, concerns, problems or updates or email [place] through MyChart.  Generally speaking, sleep test authorizations may take up to 2 weeks, sometimes less, sometimes longer, the patient is encouraged to get in touch with [place] if they do not hear back from the sleep lab staff directly within the next 2 weeks.  Thank you very much for allowing me to participate in the care of this nice patient. If I can be of any further assistance to you please do not hesitate to call me at [phone].  Sincerely,   Huston Foley, MD, PhD

## 2023-05-09 ENCOUNTER — Ambulatory Visit: Payer: 59 | Admitting: Neurology

## 2023-05-09 DIAGNOSIS — R351 Nocturia: Secondary | ICD-10-CM

## 2023-05-09 DIAGNOSIS — G4719 Other hypersomnia: Secondary | ICD-10-CM

## 2023-05-09 DIAGNOSIS — R0683 Snoring: Secondary | ICD-10-CM

## 2023-05-09 DIAGNOSIS — E66811 Obesity, class 1: Secondary | ICD-10-CM

## 2023-05-09 DIAGNOSIS — J438 Other emphysema: Secondary | ICD-10-CM

## 2023-05-09 DIAGNOSIS — Z9189 Other specified personal risk factors, not elsewhere classified: Secondary | ICD-10-CM

## 2023-05-09 DIAGNOSIS — G4734 Idiopathic sleep related nonobstructive alveolar hypoventilation: Secondary | ICD-10-CM

## 2023-05-09 DIAGNOSIS — F17200 Nicotine dependence, unspecified, uncomplicated: Secondary | ICD-10-CM

## 2023-05-09 DIAGNOSIS — G4733 Obstructive sleep apnea (adult) (pediatric): Secondary | ICD-10-CM | POA: Diagnosis not present

## 2023-05-22 NOTE — Procedures (Signed)
   [facility]  HOME SLEEP TEST (SANSA) REPORT  STUDY DATE: 05/15/2023  DOB: 07/11/1960  MRN: [id number]  ORDERING CLINICIAN: Huston Foley, MD, PhD   REFERRING CLINICIAN: Marjie Skiff, NP   CLINICAL INFORMATION/HISTORY: 62 year old female with an underlying medical history of lung emphysema, smoking, hyperlipidemia, B12 deficiency, aortic atherosclerosis, squamous cell cancer of the skin, allergies and mild obesity, who reports snoring and excessive daytime somnolence.   PATIENT'S LAST REPORTED EPWORTH SLEEPINESS SCORE (ESS): 7/24.  BMI: 31 kg/m  FINDINGS:   Sleep Summary:   Total Recording Time (hours, min): 7 hours, 52 min  Total Sleep Time (hours, min):  4 hours, 57 min  Sleep Efficiency (%):    63%   Respiratory Indices:   Calculated sAHI (per hour):  4/hour         Oxygen Saturation Statistics:    Oxygen Saturation (%) Mean: 95.2%   Minimum oxygen saturation (%):                 84.8%   O2 Saturation Range (%): 84.8 - 99.1%    Pulse Rate Statistics:   Pulse Mean (bpm):    74/min    Pulse Range (65 - 119/min)   IMPRESSION/DIAGNOSES:   Primary snoring Oxygen desaturations during sleep  RECOMMENDATIONS:   This home sleep test does not demonstrate any significant obstructive or central sleep disordered breathing with a total AHI of less than 5/hour. Her total AHI was 4/hour, O2 nadir of 84.8%.  Intermittent and rather mild snoring was detected.  She did have some oxygen desaturations during sleep, but treatment with a positive airway pressure device such as AutoPap or CPAP is not indicated based on this test. Snoring may improve with avoidance of the supine sleep position and some degree of weight loss (where clinically appropriate).  Smoking cessation may aid in improving her oxygen saturations during sleep. Other causes of the patient's symptoms, including circadian rhythm disturbances, an underlying mood disorder, medication effect  and/or an underlying medical problem cannot be ruled out based on this test. Clinical correlation is recommended.  The patient should be cautioned not to drive, work at heights, or operate dangerous or heavy equipment when tired or sleepy. Review and reiteration of good sleep hygiene measures should be pursued with any patient. The patient will be advised to follow up with her referring provider, who will be notified of the test results.   I certify that I have reviewed the raw data recording prior to the issuance of this report in accordance with the standards of the American Academy of Sleep Medicine (AASM).    INTERPRETING PHYSICIAN:   Huston Foley, MD, PhD Medical Director, [facility]) Diplomat, ABPN (Neurology and Sleep)   [facility] [address], [address] [place], [place] [address] [phone]

## 2023-05-22 NOTE — Progress Notes (Signed)
See procedure note.

## 2023-05-23 ENCOUNTER — Telehealth: Payer: Self-pay | Admitting: *Deleted

## 2023-05-23 NOTE — Telephone Encounter (Signed)
-----   Message from Huston Foley sent at 05/22/2023  6:09 PM EST ----- Patient referred by her PCP, seen by me on 04/22/23, HST on 05/15/23 (SANSA device).   Please call and notify the patient that the recent home sleep test did not show any significant obstructive sleep apnea.  There was evidence of rather mild and intermittent snoring.  She did have a few desaturations during sleep but no severe oxygen drops.  Her oxygen level during sleep may very well improve after smoking cessation.  Also, snoring and oxygen saturations may improve with avoidance of the back sleep position and with some degree of weight loss.  At this juncture, she can follow-up with her primary care provider routinely.  Treatment with a CPAP or AutoPap machine is not indicated.  If she has disturbing snoring, an oral appliance through dentistry can be considered and if she would like a referral, we can facilitate.    Thanks,  Huston Foley, MD, PhD [facility])

## 2023-05-23 NOTE — Telephone Encounter (Signed)
LVM for pr to  call back to review sleep study results

## 2023-05-27 ENCOUNTER — Encounter (INDEPENDENT_AMBULATORY_CARE_PROVIDER_SITE_OTHER): Payer: Self-pay | Admitting: Nurse Practitioner

## 2023-05-27 ENCOUNTER — Ambulatory Visit (INDEPENDENT_AMBULATORY_CARE_PROVIDER_SITE_OTHER): Payer: 59 | Admitting: Nurse Practitioner

## 2023-05-27 VITALS — BP 124/71 | HR 90 | Resp 16 | Wt 171.8 lb

## 2023-05-27 DIAGNOSIS — I8312 Varicose veins of left lower extremity with inflammation: Secondary | ICD-10-CM

## 2023-05-27 DIAGNOSIS — I8310 Varicose veins of unspecified lower extremity with inflammation: Secondary | ICD-10-CM

## 2023-05-27 NOTE — Progress Notes (Signed)
Varicose veins of left lower extremity with inflammation (454.1  I83.10) Current Plans   Indication: Patient presents with symptomatic varicose veins of the left lower extremity.   Procedure: Sclerotherapy using hypertonic saline mixed with 1% Lidocaine was performed on the left lower extremity. Compression wraps were placed. The patient tolerated the procedure well. 

## 2023-05-27 NOTE — Telephone Encounter (Signed)
Spoke to pt gave sleep study results Gave Dr. Johny Sax recommendation. Pt expressed understanding and declined referral for oral appliance Pt thanked me for calling

## 2023-06-26 ENCOUNTER — Ambulatory Visit (INDEPENDENT_AMBULATORY_CARE_PROVIDER_SITE_OTHER): Payer: 59 | Admitting: Nurse Practitioner

## 2023-06-26 ENCOUNTER — Encounter (INDEPENDENT_AMBULATORY_CARE_PROVIDER_SITE_OTHER): Payer: Self-pay | Admitting: Nurse Practitioner

## 2023-06-26 VITALS — BP 141/85 | HR 85 | Resp 16 | Ht 64.0 in | Wt 171.0 lb

## 2023-06-26 DIAGNOSIS — I8310 Varicose veins of unspecified lower extremity with inflammation: Secondary | ICD-10-CM

## 2023-06-26 DIAGNOSIS — I8312 Varicose veins of left lower extremity with inflammation: Secondary | ICD-10-CM | POA: Diagnosis not present

## 2023-06-30 NOTE — Progress Notes (Signed)
Varicose veins of left lower extremity with inflammation (454.1  I83.10) Current Plans   Indication: Patient presents with symptomatic varicose veins of the left lower extremity.   Procedure: Sclerotherapy using hypertonic saline mixed with 1% Lidocaine was performed on the left lower extremity. Compression wraps were placed. The patient tolerated the procedure well. 

## 2023-07-12 NOTE — Patient Instructions (Signed)
 Be Involved in Caring For Your Health:  Taking Medications When medications are taken as directed, they can greatly improve your health. But if they are not taken as prescribed, they may not work. In some cases, not taking them correctly can be harmful. To help ensure your treatment remains effective and safe, understand your medications and how to take them. Bring your medications to each visit for review by your provider.  Your lab results, notes, and after visit summary will be available on My Chart. We strongly encourage you to use this feature. If lab results are abnormal the clinic will contact you with the appropriate steps. If the clinic does not contact you assume the results are satisfactory. You can always view your results on My Chart. If you have questions regarding your health or results, please contact the clinic during office hours. You can also ask questions on My Chart.  We at [facility] are grateful that you chose [place] to provide your care. We strive to provide evidence-based and compassionate care and are always looking for feedback. If you get a survey from the clinic please complete this so we can hear your opinions.  Eating Plan for Chronic Obstructive Pulmonary Disease Chronic obstructive pulmonary disease (COPD) causes symptoms such as shortness of breath, coughing, and chest discomfort. These symptoms can make it difficult to eat enough to maintain a healthy weight. Generally, people with COPD should eat a diet that is high in calories, protein, and other nutrients to maintain body weight and to keep the lungs as healthy as possible. Depending on the medicines you take and other health conditions you may have, your health care provider may give you additional recommendations on what to eat or avoid. Talk with your health care provider about your goals for body weight, and work with a dietitian to develop an eating plan that is right for you. What are tips for  following this plan? Reading food labels  Avoid foods with more than 300 milligrams (mg) of salt (sodium) per serving. Choose foods that contain at least 4 grams (g) of fiber per serving. Try to eat 20-30 g of fiber each day. Choose foods that are high in calories and protein, such as nuts, beans, yogurt, and cheese. Shopping Do not buy foods labeled as diet, low-calorie, or low-fat. If you are able to eat dairy products: Avoid low-fat or skim milk. Buy dairy products that have at least 2% fat. Buy nutritional supplement drinks. Buy grains and prepared foods labeled as enriched or fortified. Consider buying low-sodium, pre-made foods to conserve energy for eating. Cooking Add dry milk or protein powder to smoothies. Cook with healthy fats, such as olive oil, canola oil, sunflower oil, and grapeseed oil. Add oil, butter, cream cheese, or nut butters to foods to increase fat and calories. To make foods easier to chew and swallow: Cook vegetables, pasta, and rice until soft. Cut or grind meat into very small pieces. Dip breads in liquid. Meal planning  Eat when you feel hungry. Eat 5-6 small meals throughout the day. Drink 6-8 glasses of water each day. Do not drink liquids with meals. Drink liquids at the end of the meal to avoid feeling full too quickly. Eat a variety of fruits and vegetables every day. Ask for assistance from family or friends with planning and preparing meals as needed. Avoid foods that cause you to feel bloated, such as carbonated drinks, fried foods, beans, broccoli, cabbage, and apples. For older adults, ask your local  agency on aging whether you are eligible for meal assistance programs, such as Meals on Wheels. Lifestyle  Do not smoke. Eat slowly. Take small bites and chew food well before swallowing. Do not overeat. This may make it more difficult to breathe after eating. Sit up while eating. If needed, continue to use supplemental oxygen while  eating. Rest or relax for 30 minutes before and after eating. Monitor your weight as told by your health care provider. Exercise as told by your health care provider. What foods should I eat? Fruits All fresh, dried, canned, or frozen fruits that do not cause gas. Vegetables All fresh, canned (no salt added), or frozen vegetables that do not cause gas. Grains Whole-grain bread. Enriched whole-grain pasta. Fortified whole-grain cereals. Fortified rice. Quinoa. Meats and other proteins Lean meat. Poultry. Fish. Dried beans. Unsalted nuts. Tofu. Eggs. Nut butters. Dairy Whole or 2% milk. Cheese. Yogurt. Fats and oils Olive oil. Canola oil. Butter. Margarine. Beverages Water. Vegetable juice (no salt added). Decaffeinated coffee. Decaffeinated or herbal tea. Seasonings and condiments Fresh or dried herbs. Low-salt or salt-free seasonings. Low-sodium soy sauce. The items listed above may not be a complete list of foods and beverages you can eat. Contact a dietitian for more information. What foods should I avoid? Fruits Fruits that cause gas, such as apples or melon. Vegetables Vegetables that cause gas, such as broccoli, Brussels sprouts, cabbage, cauliflower, and onions. Canned vegetables with added salt. Meats and other proteins Fried meat. Salt-cured meat. Processed meat. Dairy Fat-free or low-fat milk, yogurt, or cheese. Processed cheese. Beverages Carbonated drinks. Caffeinated drinks, such as coffee, tea, and soft drinks. Juice. Alcohol. Vegetable juice with added salt. Seasonings and condiments Salt. Seasoning mixes with salt. Soy sauce. Rosita Fire. Other foods Clear soup or broth. Fried foods. Prepared frozen meals. The items listed above may not be a complete list of foods and beverages you should avoid. Contact a dietitian for more information. Summary COPD symptoms can make it difficult to eat enough to maintain a healthy weight. A COPD eating plan can help you maintain  your body weight and keep your lungs as healthy as possible. Eat a diet that is high in calories, protein, and other nutrients. Read labels to make sure that you are getting the right nutrients. Cook foods to make them easier to chew and swallow. Eat 5-6 small meals throughout the day, and avoid foods that cause gas or make you feel bloated. This information is not intended to replace advice given to you by your health care provider. Make sure you discuss any questions you have with your health care provider. Document Revised: 05/03/2020 Document Reviewed: 05/03/2020 Elsevier Patient Education  2024 [employer].

## 2023-07-15 ENCOUNTER — Encounter: Payer: Self-pay | Admitting: Nurse Practitioner

## 2023-07-15 ENCOUNTER — Ambulatory Visit: Payer: 59 | Admitting: Nurse Practitioner

## 2023-07-15 VITALS — BP 123/79 | HR 89 | Temp 97.8°F | Ht 63.0 in | Wt 164.0 lb

## 2023-07-15 DIAGNOSIS — E538 Deficiency of other specified B group vitamins: Secondary | ICD-10-CM

## 2023-07-15 DIAGNOSIS — J432 Centrilobular emphysema: Secondary | ICD-10-CM

## 2023-07-15 DIAGNOSIS — F17210 Nicotine dependence, cigarettes, uncomplicated: Secondary | ICD-10-CM | POA: Diagnosis not present

## 2023-07-15 DIAGNOSIS — E782 Mixed hyperlipidemia: Secondary | ICD-10-CM

## 2023-07-15 DIAGNOSIS — I700 Atherosclerosis of aorta: Secondary | ICD-10-CM | POA: Diagnosis not present

## 2023-07-15 MED ORDER — PREDNISONE 20 MG PO TABS
40.0000 mg | ORAL_TABLET | Freq: Every day | ORAL | 0 refills | Status: AC
Start: 2023-07-15 — End: 2023-07-20

## 2023-07-15 MED ORDER — AZITHROMYCIN 250 MG PO TABS: ORAL_TABLET | ORAL | 0 refills | Status: AC

## 2023-07-15 NOTE — Assessment & Plan Note (Signed)
 I have recommended complete cessation of tobacco use. I have discussed various options available for assistance with tobacco cessation including over the counter methods (Nicotine gum, patch and lozenges). We also discussed prescription options (Chantix, Nicotine Inhaler / Nasal Spray). The patient is not interested in pursuing any prescription tobacco cessation options at this time.

## 2023-07-15 NOTE — Assessment & Plan Note (Signed)
 Chronic, taking supplement via injections with [place] weight loss.  Check level today.

## 2023-07-15 NOTE — Assessment & Plan Note (Signed)
Chronic.  Noted on lung CT screening.  Continue statin and ASA daily for prevention purposes.  Recommend complete cessation of smoking.

## 2023-07-15 NOTE — Assessment & Plan Note (Addendum)
 Ongoing and stable.  Continue Atorvastatin 10 MG daily.  Check lipid panel and CMP today.  Recommend diet changes and modest weight loss.

## 2023-07-15 NOTE — Assessment & Plan Note (Signed)
Chronic, ongoing, noted on screening.  Recommend complete cessation of smoking. Continue Anoro daily and Albuterol PRN.  Continue annual lung screening.  Zpack and Prednisone sent for her upcoming [place] to use as needed if illness presents.

## 2023-07-15 NOTE — Progress Notes (Signed)
 BP 123/79   Pulse 89   Temp 97.8 F (36.6 C) (Oral)   Ht 5' 3 (1.6 m)   Wt 164 lb (74.4 kg)   LMP 09/07/2010 (Approximate)   SpO2 98%   BMI 29.05 kg/m    Subjective:    Patient ID: Aimee Lawson, female    DOB: 1961-02-02, 63 y.o.   MRN: [id number]  HPI: Aimee Lawson is a 63 y.o. female  Chief Complaint  Patient presents with   COPD   Hyperlipidemia   Goes to [facility] -- getting Mounjaro via them.  Takes twice a month, $200 an injection.  Gets B12 shots from them.  COPD Centrilobular and paraseptal emphysema + aortic atherosclerosis, last screening on 03/12/23. Uses Anoro daily, Albuterol  as needed.    Does smoke -- averages <1 PPD - cutting back, has smoked since she was 15. Has tried various medications for cessation in past without benefit. COPD status: stable Satisfied with current treatment?: no Oxygen use: no Dyspnea frequency: no Cough frequency: occasional Rescue inhaler frequency: used once last year Limitation of activity: no Productive cough: no Last Spirometry: 08/22/22 FEV1 69% and FEV1/FVC 65% Pneumovax: Up To Date Influenza: Up to Date    HYPERLIPIDEMIA Continues on Atorvastatin .   Hyperlipidemia status: good compliance Satisfied with current treatment?  yes Side effects:  no Medication compliance: good compliance Supplements: none Aspirin:  yes The 10-year ASCVD risk score (Arnett DK, et al., 2019) is: 7.1%   Values used to calculate the score:     Age: 19 years     Sex: Female     Is Non-Hispanic African American: No     Diabetic: No     Tobacco smoker: Yes     Systolic Blood Pressure: 123 mmHg     Is BP treated: No     HDL Cholesterol: 63 mg/dL     Total Cholesterol: 156 mg/dL Chest pain:  no Coronary artery disease:  no Family history CAD: yes Family history early CAD:  yes  Relevant past medical, surgical, family and social history reviewed and updated as indicated. Interim medical history since our last visit  reviewed. Allergies and medications reviewed and updated.  Review of Systems  Constitutional:  Negative for activity change, appetite change, diaphoresis, fatigue and fever.  Respiratory:  Negative for cough, chest tightness, shortness of breath and wheezing.   Cardiovascular:  Negative for chest pain, palpitations and leg swelling.  Gastrointestinal: Negative.   Neurological: Negative.   Psychiatric/Behavioral: Negative.      Per HPI unless specifically indicated above     Objective:    BP 123/79   Pulse 89   Temp 97.8 F (36.6 C) (Oral)   Ht 5' 3 (1.6 m)   Wt 164 lb (74.4 kg)   LMP 09/07/2010 (Approximate)   SpO2 98%   BMI 29.05 kg/m   Wt Readings from Last 3 Encounters:  07/15/23 164 lb (74.4 kg)  06/26/23 171 lb (77.6 kg)  05/27/23 171 lb 12.8 oz (77.9 kg)    Physical Exam Vitals and nursing note reviewed.  Constitutional:      General: She is awake. She is not in acute distress.    Appearance: She is well-developed, well-groomed and overweight. She is not ill-appearing.  HENT:     Head: Normocephalic.     Right Ear: Hearing normal.     Left Ear: Hearing normal.  Eyes:     General: Lids are normal.  Right eye: No discharge.        Left eye: No discharge.     Conjunctiva/sclera: Conjunctivae normal.     Pupils: Pupils are equal, round, and reactive to light.  Neck:     Thyroid: No thyromegaly.     Vascular: Carotid bruit present.  Cardiovascular:     Rate and Rhythm: Normal rate and regular rhythm.     Heart sounds: Normal heart sounds. No murmur heard.    No gallop.  Pulmonary:     Effort: Pulmonary effort is normal. No accessory muscle usage or respiratory distress.     Breath sounds: Normal breath sounds.  Abdominal:     General: Bowel sounds are normal.     Palpations: Abdomen is soft. There is no hepatomegaly or splenomegaly.  Musculoskeletal:     Cervical back: Normal range of motion and neck supple.     Right lower leg: No edema.      Left lower leg: No edema.  Lymphadenopathy:     Cervical: No cervical adenopathy.  Skin:    General: Skin is warm and dry.  Neurological:     Mental Status: She is alert and oriented to person, place, and time.  Psychiatric:        Attention and Perception: Attention normal.        Mood and Affect: Mood normal.        Speech: Speech normal.        Behavior: Behavior normal. Behavior is cooperative.        Thought Content: Thought content normal.    Results for orders placed or performed in visit on 02/20/23  CBC with Differential/Platelet   Collection Time: 02/20/23 10:46 AM  Result Value Ref Range   WBC 6.3 3.4 - 10.8 x10E3/uL   RBC 4.12 3.77 - 5.28 x10E6/uL   Hemoglobin 12.8 11.1 - 15.9 g/dL   Hematocrit 62.2 65.9 - 46.6 %   MCV 92 79 - 97 fL   MCH 31.1 26.6 - 33.0 pg   MCHC 34.0 31.5 - 35.7 g/dL   RDW 88.1 88.2 - 84.5 %   Platelets 237 150 - 450 x10E3/uL   Neutrophils 49 Not Estab. %   Lymphs 40 Not Estab. %   Monocytes 8 Not Estab. %   Eos 2 Not Estab. %   Basos 1 Not Estab. %   Neutrophils Absolute 3.1 1.4 - 7.0 x10E3/uL   Lymphocytes Absolute 2.5 0.7 - 3.1 x10E3/uL   Monocytes Absolute 0.5 0.1 - 0.9 x10E3/uL   EOS (ABSOLUTE) 0.2 0.0 - 0.4 x10E3/uL   Basophils Absolute 0.0 0.0 - 0.2 x10E3/uL   Immature Granulocytes 0 Not Estab. %   Immature Grans (Abs) 0.0 0.0 - 0.1 x10E3/uL  Comprehensive metabolic panel   Collection Time: 02/20/23 10:46 AM  Result Value Ref Range   Glucose 83 70 - 99 mg/dL   BUN 21 8 - 27 mg/dL   Creatinine, Ser 9.04 0.57 - 1.00 mg/dL   eGFR 67 >40 fO/fpw/8.26   BUN/Creatinine Ratio 22 12 - 28   Sodium 142 134 - 144 mmol/L   Potassium 4.7 3.5 - 5.2 mmol/L   Chloride 104 96 - 106 mmol/L   CO2 23 20 - 29 mmol/L   Calcium  9.5 8.7 - 10.3 mg/dL   Total Protein 6.4 6.0 - 8.5 g/dL   Albumin 4.3 3.9 - 4.9 g/dL   Globulin, Total 2.1 1.5 - 4.5 g/dL   Bilirubin Total 0.5 0.0 - 1.2 mg/dL  Alkaline Phosphatase 123 (H) 44 - 121 IU/L   AST 14 0 - 40  IU/L   ALT 11 0 - 32 IU/L  TSH   Collection Time: 02/20/23 10:46 AM  Result Value Ref Range   TSH 1.860 0.450 - 4.500 uIU/mL  Lipid Panel w/o Chol/HDL Ratio   Collection Time: 02/20/23 10:46 AM  Result Value Ref Range   Cholesterol, Total 156 100 - 199 mg/dL   Triglycerides 94 0 - 149 mg/dL   HDL 63 >60 mg/dL   VLDL Cholesterol Cal 17 5 - 40 mg/dL   LDL Chol Calc (NIH) 76 0 - 99 mg/dL  Vitamin B12   Collection Time: 02/20/23 10:46 AM  Result Value Ref Range   Vitamin B-12 >2000 (H) 232 - 1245 pg/mL      Assessment & Plan:   Problem List Items Addressed This Visit       Cardiovascular and Mediastinum   Aortic atherosclerosis (HCC)   Chronic.  Noted on lung CT screening.  Continue statin and ASA daily for prevention purposes.  Recommend complete cessation of smoking.      Relevant Orders   Comprehensive metabolic panel   Lipid Panel w/o Chol/HDL Ratio     Respiratory   Centrilobular emphysema (HCC) - Primary   Chronic, ongoing, noted on screening.  Recommend complete cessation of smoking. Continue Anoro daily and Albuterol  PRN.  Continue annual lung screening.  Zpack and Prednisone  [place] to use as needed if illness presents.      Relevant Medications   azithromycin  (ZITHROMAX ) 250 MG tablet   predniSONE  (DELTASONE ) 20 MG tablet   Other Relevant Orders   CBC with Differential/Platelet     Other   B12 deficiency   Chronic, taking supplement via injections with [place] weight loss.  Check level today.      Relevant Orders   CBC with Differential/Platelet   Vitamin B12   Cigarette nicotine  dependence without complication   I have recommended complete cessation of tobacco use. I have discussed various options available for assistance with tobacco cessation including over the counter methods (Nicotine  gum, patch and lozenges). We also discussed prescription options (Chantix , Nicotine  Inhaler / Nasal Spray). The patient is not  interested in pursuing any prescription tobacco cessation options at this time.       Hyperlipemia   Ongoing and stable.  Continue Atorvastatin  10 MG daily.  Check lipid panel and CMP today.  Recommend diet changes and modest weight loss.       Relevant Orders   Comprehensive metabolic panel   Lipid Panel w/o Chol/HDL Ratio     Follow up plan: Return in about 7 months (around 02/21/2024) for Annual Physical after 02/20/24.

## 2023-07-16 LAB — CBC WITH DIFFERENTIAL/PLATELET
Basophils Absolute: 0 10*3/uL (ref 0.0–0.2)
Basos: 0 %
EOS (ABSOLUTE): 0.1 10*3/uL (ref 0.0–0.4)
Eos: 1 %
Hematocrit: 41.6 % (ref 34.0–46.6)
Hemoglobin: 13.7 g/dL (ref 11.1–15.9)
Immature Grans (Abs): 0 10*3/uL (ref 0.0–0.1)
Immature Granulocytes: 0 %
Lymphocytes Absolute: 3.2 10*3/uL — ABNORMAL HIGH (ref 0.7–3.1)
Lymphs: 43 %
MCH: 31.3 pg (ref 26.6–33.0)
MCHC: 32.9 g/dL (ref 31.5–35.7)
MCV: 95 fL (ref 79–97)
Monocytes Absolute: 0.6 10*3/uL (ref 0.1–0.9)
Monocytes: 7 %
Neutrophils Absolute: 3.5 10*3/uL (ref 1.4–7.0)
Neutrophils: 49 %
Platelets: 231 10*3/uL (ref 150–450)
RBC: 4.38 x10E6/uL (ref 3.77–5.28)
RDW: 12.1 % (ref 11.7–15.4)
WBC: 7.4 10*3/uL (ref 3.4–10.8)

## 2023-07-16 LAB — COMPREHENSIVE METABOLIC PANEL
ALT: 10 [IU]/L (ref 0–32)
AST: 13 [IU]/L (ref 0–40)
Albumin: 4.4 g/dL (ref 3.9–4.9)
Alkaline Phosphatase: 92 [IU]/L (ref 44–121)
BUN/Creatinine Ratio: 11 — ABNORMAL LOW (ref 12–28)
BUN: 12 mg/dL (ref 8–27)
Bilirubin Total: 0.5 mg/dL (ref 0.0–1.2)
CO2: 25 mmol/L (ref 20–29)
Calcium: 9.1 mg/dL (ref 8.7–10.3)
Chloride: 105 mmol/L (ref 96–106)
Creatinine, Ser: 1.05 mg/dL — ABNORMAL HIGH (ref 0.57–1.00)
Globulin, Total: 2.1 g/dL (ref 1.5–4.5)
Glucose: 76 mg/dL (ref 70–99)
Potassium: 4 mmol/L (ref 3.5–5.2)
Sodium: 144 mmol/L (ref 134–144)
Total Protein: 6.5 g/dL (ref 6.0–8.5)
eGFR: 59 mL/min/{1.73_m2} — ABNORMAL LOW (ref >59)

## 2023-07-16 LAB — LIPID PANEL W/O CHOL/HDL RATIO
Cholesterol, Total: 162 mg/dL (ref 100–199)
HDL: 64 mg/dL (ref >39)
LDL Chol Calc (NIH): 79 mg/dL (ref 0–99)
Triglycerides: 105 mg/dL (ref 0–149)
VLDL Cholesterol Cal: 19 mg/dL (ref 5–40)

## 2023-07-16 LAB — VITAMIN B12: Vitamin B-12: >2000 pg/mL — ABNORMAL HIGH (ref 232–1245)

## 2023-07-16 NOTE — Progress Notes (Signed)
 Contacted via MyChart   Good morning [facility], your labs have returned: - Kidney function, creatinine and eGFR, showing a little drop this check.  Ensure to take in lots of fluid daily, water.  Liver function, AST and ALT, normal. - B12 level stable and CBC shows no anemia or infection. - Lipid panel remains stable.  Any questions? Keep being stellar!!  Thank you for allowing me to participate in your care.  I appreciate you. Kindest regards, Ameenah Prosser

## 2023-08-06 ENCOUNTER — Encounter: Payer: Self-pay | Admitting: Nurse Practitioner

## 2023-08-14 ENCOUNTER — Ambulatory Visit: Payer: 59 | Admitting: Nurse Practitioner

## 2023-11-05 ENCOUNTER — Encounter: Payer: Self-pay | Admitting: Nurse Practitioner

## 2023-11-06 ENCOUNTER — Telehealth (INDEPENDENT_AMBULATORY_CARE_PROVIDER_SITE_OTHER): Admitting: Nurse Practitioner

## 2023-11-06 ENCOUNTER — Encounter: Payer: Self-pay | Admitting: Nurse Practitioner

## 2023-11-06 DIAGNOSIS — J441 Chronic obstructive pulmonary disease with (acute) exacerbation: Secondary | ICD-10-CM

## 2023-11-06 MED ORDER — AZITHROMYCIN 250 MG PO TABS: ORAL_TABLET | ORAL | 0 refills | Status: AC

## 2023-11-06 NOTE — Progress Notes (Signed)
 LMP 09/07/2010 (Approximate)    Subjective:    Patient ID: Aimee Lawson, female    DOB: 1960-08-28, 63 y.o.   MRN: [id number]  HPI: Aimee Lawson is a 62 y.o. female  Chief Complaint  Patient presents with   URI    Patient states she has been having a cough and chest congestion for the last 2 weeks. States she is taking allergy medication and Prednisone . States the prednisone  is not helping.    Virtual Visit via Video Note  I connected with Aimee Lawson on 11/06/23 at  9:00 AM EDT by a video enabled telemedicine application and verified that I am speaking with the correct person using two identifiers.  Location: Patient: home Provider: work   I discussed the limitations of evaluation and management by telemedicine and the availability of in person appointments. The patient expressed understanding and agreed to proceed.  I discussed the assessment and treatment plan with the patient. The patient was provided an opportunity to ask questions and all were answered. The patient agreed with the plan and demonstrated an understanding of the instructions.   The patient was advised to call back or seek an in-person evaluation if the symptoms worsen or if the condition fails to improve as anticipated.  I provided 25 minutes of non-face-to-face time during this encounter.   Jahkari Maclin T Gredmarie Delange, NP   UPPER RESPIRATORY TRACT INFECTION Started with a cough and congestion 2 weeks ago, had allergy issues at time.  Last Thursday tried some leftover Prednisone , which did not help.  Going to [place]  [employer] and wants to feel better. Fever: no Cough: yes Shortness of breath: yes Wheezing: yes Chest pain: no Chest tightness: yes Chest congestion: yes Nasal congestion: no Runny nose: yes Post nasal drip: yes Sneezing: no Sore throat: yes Swollen glands: no Sinus pressure: no Headache: no Face pain: no Toothache: no Ear pain: none Ear pressure: none Eyes  red/itching:no Eye drainage/crusting: no  Vomiting: no Rash: no Fatigue: yes Sick contacts: yes Strep contacts: no  Context: fluctuating Recurrent sinusitis: no Relief with OTC cold/cough medications: yes - allergy meds help runny nose Treatments attempted: allergy medication and Prednisone     Relevant past medical, surgical, family and social history reviewed and updated as indicated. Interim medical history since our last visit reviewed. Allergies and medications reviewed and updated.  Review of Systems  Constitutional:  Positive for fatigue. Negative for activity change, appetite change, chills and fever.  HENT:  Positive for postnasal drip, rhinorrhea and sore throat. Negative for congestion, ear discharge, ear pain, facial swelling, sinus pressure, sinus pain, sneezing and voice change.   Eyes:  Negative for pain and visual disturbance.  Respiratory:  Positive for cough, chest tightness, shortness of breath and wheezing.   Cardiovascular:  Negative for chest pain, palpitations and leg swelling.  Endocrine: Negative.   Neurological: Negative.   Psychiatric/Behavioral: Negative.      Per HPI unless specifically indicated above     Objective:    LMP 09/07/2010 (Approximate)   Wt Readings from Last 3 Encounters:  07/15/23 164 lb (74.4 kg)  06/26/23 171 lb (77.6 kg)  05/27/23 171 lb 12.8 oz (77.9 kg)    Physical Exam Vitals and nursing note reviewed.  Constitutional:      General: She is awake. She is not in acute distress.    Appearance: She is well-developed and well-groomed. She is not ill-appearing or toxic-appearing.  HENT:     Head: Normocephalic.  Right Ear: Hearing normal.     Left Ear: Hearing normal.  Eyes:     General: Lids are normal.        Right eye: No discharge.        Left eye: No discharge.     Conjunctiva/sclera: Conjunctivae normal.  Pulmonary:     Effort: Pulmonary effort is normal. No accessory muscle usage or respiratory distress.   Musculoskeletal:     Cervical back: Normal range of motion.  Neurological:     Mental Status: She is alert and oriented to person, place, and time.  Psychiatric:        Attention and Perception: Attention normal.        Mood and Affect: Mood normal.        Behavior: Behavior normal. Behavior is cooperative.        Thought Content: Thought content normal.        Judgment: Judgment normal.    Results for orders placed or performed in visit on 07/15/23  CBC with Differential/Platelet   Collection Time: 07/15/23  3:27 PM  Result Value Ref Range   WBC 7.4 3.4 - 10.8 x10E3/uL   RBC 4.38 3.77 - 5.28 x10E6/uL   Hemoglobin 13.7 11.1 - 15.9 g/dL   Hematocrit 14.7 82.9 - 46.6 %   MCV 95 79 - 97 fL   MCH 31.3 26.6 - 33.0 pg   MCHC 32.9 31.5 - 35.7 g/dL   RDW 56.2 13.0 - 86.5 %   Platelets 231 150 - 450 x10E3/uL   Neutrophils 49 Not Estab. %   Lymphs 43 Not Estab. %   Monocytes 7 Not Estab. %   Eos 1 Not Estab. %   Basos 0 Not Estab. %   Neutrophils Absolute 3.5 1.4 - 7.0 x10E3/uL   Lymphocytes Absolute 3.2 (H) 0.7 - 3.1 x10E3/uL   Monocytes Absolute 0.6 0.1 - 0.9 x10E3/uL   EOS (ABSOLUTE) 0.1 0.0 - 0.4 x10E3/uL   Basophils Absolute 0.0 0.0 - 0.2 x10E3/uL   Immature Granulocytes 0 Not Estab. %   Immature Grans (Abs) 0.0 0.0 - 0.1 x10E3/uL  Comprehensive metabolic panel   Collection Time: 07/15/23  3:27 PM  Result Value Ref Range   Glucose 76 70 - 99 mg/dL   BUN 12 8 - 27 mg/dL   Creatinine, Ser 7.84 (H) 0.57 - 1.00 mg/dL   eGFR 59 (L) >69 GE/XBM/8.41   BUN/Creatinine Ratio 11 (L) 12 - 28   Sodium 144 134 - 144 mmol/L   Potassium 4.0 3.5 - 5.2 mmol/L   Chloride 105 96 - 106 mmol/L   CO2 25 20 - 29 mmol/L   Calcium  9.1 8.7 - 10.3 mg/dL   Total Protein 6.5 6.0 - 8.5 g/dL   Albumin 4.4 3.9 - 4.9 g/dL   Globulin, Total 2.1 1.5 - 4.5 g/dL   Bilirubin Total 0.5 0.0 - 1.2 mg/dL   Alkaline Phosphatase 92 44 - 121 IU/L   AST 13 0 - 40 IU/L   ALT 10 0 - 32 IU/L  Lipid Panel w/o  Chol/HDL Ratio   Collection Time: 07/15/23  3:27 PM  Result Value Ref Range   Cholesterol, Total 162 100 - 199 mg/dL   Triglycerides 324 0 - 149 mg/dL   HDL 64 >40 mg/dL   VLDL Cholesterol Cal 19 5 - 40 mg/dL   LDL Chol Calc (NIH) 79 0 - 99 mg/dL  Vitamin B12   Collection Time: 07/15/23  3:27 PM  Result Value Ref  Range   Vitamin B-12 >2000 (H) 232 - 1245 pg/mL      Assessment & Plan:   Problem List Items Addressed This Visit       Respiratory   COPD exacerbation (HCC) - Primary   Underlying emphysema with current acute exacerbation ongoing for 2 weeks.  Recommend she take no more Prednisone  as has taken 40 MG daily for 5 days.  Start Zpack which has worked well for her in past.  Continue OTC antihistamine therapy.  Recommend: - Increased rest - Increasing Fluids - Acetaminophen  as needed for fever/pain.  - Salt water gargling, chloraseptic spray and throat lozenges - Mucinex.  - Humidifying the air.       Relevant Medications   azithromycin  (ZITHROMAX ) 250 MG tablet     Follow up plan: Return if symptoms worsen or fail to improve.

## 2023-11-06 NOTE — Assessment & Plan Note (Signed)
 Underlying emphysema with current acute exacerbation ongoing for 2 weeks.  Recommend she take no more Prednisone  as has taken 40 MG daily for 5 days.  Start Zpack which has worked well for her in past.  Continue OTC antihistamine therapy.  Recommend: - Increased rest - Increasing Fluids - Acetaminophen  as needed for fever/pain.  - Salt water gargling, chloraseptic spray and throat lozenges - Mucinex.  - Humidifying the air.

## 2023-11-06 NOTE — Patient Instructions (Signed)
 COPD Exacerbation This video will teach you what types of triggers can make COPD worse, and how to avoid them. To view the content, go to this web address: [email or web address]  This video will expire on: 06/19/2025. If you need access to this video following this date, please reach out to the healthcare provider who assigned it to you. This information is not intended to replace advice given to you by your health care provider. Make sure you discuss any questions you have with your health care provider. Elsevier Patient Education  2024 [employer].

## 2023-11-26 ENCOUNTER — Encounter (INDEPENDENT_AMBULATORY_CARE_PROVIDER_SITE_OTHER): Payer: Self-pay

## 2023-12-04 ENCOUNTER — Telehealth: Payer: Self-pay

## 2023-12-04 NOTE — Telephone Encounter (Signed)
 Error

## 2024-01-13 ENCOUNTER — Ambulatory Visit: Admitting: Dermatology

## 2024-01-13 ENCOUNTER — Encounter: Payer: Self-pay | Admitting: Dermatology

## 2024-01-13 DIAGNOSIS — L570 Actinic keratosis: Secondary | ICD-10-CM

## 2024-01-13 DIAGNOSIS — L578 Other skin changes due to chronic exposure to nonionizing radiation: Secondary | ICD-10-CM | POA: Diagnosis not present

## 2024-01-13 DIAGNOSIS — Z5111 Encounter for antineoplastic chemotherapy: Secondary | ICD-10-CM

## 2024-01-13 DIAGNOSIS — D1801 Hemangioma of skin and subcutaneous tissue: Secondary | ICD-10-CM

## 2024-01-13 DIAGNOSIS — L814 Other melanin hyperpigmentation: Secondary | ICD-10-CM | POA: Diagnosis not present

## 2024-01-13 DIAGNOSIS — Z1283 Encounter for screening for malignant neoplasm of skin: Secondary | ICD-10-CM | POA: Diagnosis not present

## 2024-01-13 DIAGNOSIS — Z79899 Other long term (current) drug therapy: Secondary | ICD-10-CM

## 2024-01-13 DIAGNOSIS — D229 Melanocytic nevi, unspecified: Secondary | ICD-10-CM

## 2024-01-13 DIAGNOSIS — L820 Inflamed seborrheic keratosis: Secondary | ICD-10-CM

## 2024-01-13 DIAGNOSIS — Z86018 Personal history of other benign neoplasm: Secondary | ICD-10-CM

## 2024-01-13 DIAGNOSIS — W908XXA Exposure to other nonionizing radiation, initial encounter: Secondary | ICD-10-CM | POA: Diagnosis not present

## 2024-01-13 DIAGNOSIS — Z85828 Personal history of other malignant neoplasm of skin: Secondary | ICD-10-CM

## 2024-01-13 DIAGNOSIS — Z8589 Personal history of malignant neoplasm of other organs and systems: Secondary | ICD-10-CM

## 2024-01-13 DIAGNOSIS — L821 Other seborrheic keratosis: Secondary | ICD-10-CM

## 2024-01-13 DIAGNOSIS — Z7189 Other specified counseling: Secondary | ICD-10-CM

## 2024-01-13 NOTE — Patient Instructions (Addendum)
 Cryotherapy Aftercare  Wash gently with soap and water everyday.   Apply Vaseline and Band-Aid daily until healed.   - Start 5-fluorouracil /calcipotriene  cream twice a day for up to 7 days to affected areas including forehead and temples. 3 weeks later treat cheeks twice a day for up to 7 days and 3 weeks after that treat nose, upper lip and chin twice a day for up to 7 days. In January apply cream twice daily for up to 7 days at chest. Prescription sent to [facility]. Patient advised they will receive an email to purchase the medication online and have it sent to their home. Patient provided with handout reviewing treatment course and side effects and advised to call or message us  on MyChart with any concerns.  Instructions for [facility] Medications  One or more of your medications was sent to the [facility] mail order compounding pharmacy. You will receive an email from them and can purchase the medicine through that link. It will then be mailed to your home at the address you confirmed. If for any reason you do not receive an email from them, please check your spam folder. If you still do not find the email, please let us  know. [facility] phone number is [phone].  Melanoma ABCDEs  Melanoma is the most dangerous type of skin cancer, and is the leading cause of death from skin disease.  You are more likely to develop melanoma if you: Have light-colored skin, light-colored eyes, or red or blond hair Spend a lot of time in the sun Tan regularly, either outdoors or in a tanning bed Have had blistering sunburns, especially during childhood Have a close family member who has had a melanoma Have atypical moles or large birthmarks  Early detection of melanoma is key since treatment is typically straightforward and cure rates are extremely high if we catch it early.   The first sign of melanoma is often a change in a mole or a new dark spot.  The ABCDE  system is a way of remembering the signs of melanoma.  A for asymmetry:  The two halves do not match. B for border:  The edges of the growth are irregular. C for color:  A mixture of colors are present instead of an even brown color. D for diameter:  Melanomas are usually (but not always) greater than 6mm - the size of a pencil eraser. E for evolution:  The spot keeps changing in size, shape, and color.  Please check your skin once per month between visits. You can use a small mirror in front and a large mirror behind you to keep an eye on the back side or your body.   If you see any new or changing lesions before your next follow-up, please call to schedule a visit.  Please continue daily skin protection including broad spectrum sunscreen SPF 30+ to sun-exposed areas, reapplying every 2 hours as needed when you're outdoors.    Due to recent changes in healthcare laws, you may see results of your pathology and/or laboratory studies on MyChart before the doctors have had a chance to review them. We understand that in some cases there may be results that are confusing or concerning to you. Please understand that not all results are received at the same time and often the doctors may need to interpret multiple results in order to provide you with the best plan of care or course of treatment. Therefore, we ask that you please  give us  2 business days to thoroughly review all your results before contacting the office for clarification. Should we see a critical lab result, you will be contacted sooner.   If You Need Anything After Your Visit  If you have any questions or concerns for your doctor, please call our main line at [phone] and press option 4 to reach your doctor's medical assistant. If no one answers, please leave a voicemail as directed and we will return your call as soon as possible. Messages left after 4 pm will be answered the following business day.   You may also send us  a message  via MyChart. We typically respond to MyChart messages within 1-2 business days.  For prescription refills, please ask your pharmacy to contact our office. Our fax number is [phone].  If you have an urgent issue when the clinic is closed that cannot wait until the next business day, you can page your doctor at the number below.    Please note that while we do our best to be available for urgent issues outside of office hours, we are not available 24/7.   If you have an urgent issue and are unable to reach us , you may choose to seek medical care at your doctor's office, retail clinic, urgent care center, or emergency room.  If you have a medical emergency, please immediately call 911 or go to the emergency department.  Pager Numbers  - Dr. Hester: [phone]  - Dr. Jackquline: [phone]  - Dr. Claudene: [phone]   In the event of inclement weather, please call our main line at [phone] for an update on the status of any delays or closures.  Dermatology Medication Tips: Please keep the boxes that topical medications come in in order to help keep track of the instructions about where and how to use these. Pharmacies typically print the medication instructions only on the boxes and not directly on the medication tubes.   If your medication is too expensive, please contact our office at [phone] option 4 or send us  a message through MyChart.   We are unable to tell what your co-pay for medications will be in advance as this is different depending on your insurance coverage. However, we may be able to find a substitute medication at lower cost or fill out paperwork to get insurance to cover a needed medication.   If a prior authorization is required to get your medication covered by your insurance company, please allow us  1-2 business days to complete this process.  Drug prices often vary depending on where the prescription is filled and some pharmacies may offer cheaper  prices.  The website www.goodrx.com contains coupons for medications through different pharmacies. The prices here do not account for what the cost may be with help from insurance (it may be cheaper with your insurance), but the website can give you the price if you did not use any insurance.  - You can print the associated coupon and take it with your prescription to the pharmacy.  - You may also stop by our office during regular business hours and pick up a GoodRx coupon card.  - If you need your prescription sent electronically to a different pharmacy, notify our office through [facility] or by phone at [phone] option 4.     Si Usted Necesita Algo Despus de Su Visita  Tambin puede enviarnos un mensaje a travs de Clinical cytogeneticist. Por lo general respondemos a los mensajes de MyChart en el transcurso de  1 a 2 das hbiles.  Para renovar recetas, por favor pida a su farmacia que se ponga en contacto con nuestra oficina. Randi lakes de fax es [place] [phone].  Si tiene un asunto urgente cuando la clnica est cerrada y que no puede esperar hasta el siguiente da hbil, puede llamar/localizar a su doctor(a) al nmero que aparece a continuacin.   Por favor, tenga en cuenta que aunque hacemos todo lo posible para estar disponibles para asuntos urgentes fuera del horario de [place], no estamos disponibles las 24 horas del da, los 7 [address]  [address] de la [place].   Si tiene un problema urgente y no puede comunicarse con nosotros, puede optar por buscar atencin mdica  en el consultorio de su doctor(a), en una clnica privada, en un centro de atencin urgente o en una sala de emergencias.  Si tiene Engineer, drilling, por favor llame inmediatamente al 911 o vaya a la sala de emergencias.  Nmeros de bper  - Dr. Hester: [phone]  - Dra. Jackquline: [phone]  - Dr. Claudene: [phone]   En caso de inclemencias del tiempo, por favor llame a landry capes principal al [phone]  para una actualizacin sobre el [place] de cualquier retraso o cierre.  Consejos para la medicacin en dermatologa: Por favor, guarde las cajas en las que vienen los medicamentos de uso tpico para ayudarle a seguir las instrucciones sobre dnde y cmo usarlos. Las farmacias generalmente imprimen las instrucciones del medicamento slo en las cajas y no directamente en los tubos del [place].   Si su medicamento es muy caro, por favor, pngase en contacto con landry rieger llamando al [phone] y presione la opcin 4 o envenos un mensaje a travs de Clinical cytogeneticist.   No podemos decirle cul ser su copago por los medicamentos por adelantado ya que esto es diferente dependiendo de la cobertura de su seguro. Sin embargo, es posible que podamos encontrar un medicamento sustituto a Audiological scientist un formulario para que el seguro cubra el medicamento que se considera necesario.   Si se requiere una autorizacin previa para que su compaa de seguros malta su medicamento, por favor permtanos de 1 a 2 das hbiles para completar este proceso.  Los precios de los medicamentos varan con frecuencia dependiendo del Environmental consultant de dnde se surte la receta y alguna farmacias pueden ofrecer precios ms baratos.  El sitio web www.goodrx.com tiene cupones para medicamentos de Health and safety inspector. Los precios aqu no tienen en cuenta lo que podra costar con la ayuda del seguro (puede ser ms barato con su seguro), pero el sitio web puede darle el precio si no utiliz Tourist information centre manager.  - Puede imprimir el cupn correspondiente y llevarlo con su receta a la farmacia.  - Tambin puede pasar por nuestra oficina durante el horario de atencin regular y Education officer, museum una tarjeta de cupones de GoodRx.  - Si necesita que su receta se enve electrnicamente a una farmacia diferente, informe a nuestra oficina a travs de MyChart de [place] o por telfono llamando al [phone] y presione la opcin 4.

## 2024-01-13 NOTE — Progress Notes (Signed)
 Follow-Up Visit   Subjective  Aimee Lawson is a 63 y.o. female who presents for the following: Skin Cancer Screening and Full Body Skin Exam  The patient presents for Total-Body Skin Exam (TBSE) for skin cancer screening and mole check. The patient has spots, moles and lesions to be evaluated, some may be new or changing and the patient may have concern these could be cancer.  Patient with hx of SCC, AK, dysplastic nevi. She does have a few rough spots at legs, face and chest. Patient has treated chest and face with red light.   The following portions of the chart were reviewed this encounter and updated as appropriate: medications, allergies, medical history  Review of Systems:  No other skin or systemic complaints except as noted in HPI or Assessment and Plan.  Objective  Well appearing patient in no apparent distress; mood and affect are within normal limits.  A full examination was performed including scalp, head, eyes, ears, nose, lips, neck, chest, axillae, abdomen, back, buttocks, bilateral upper extremities, bilateral lower extremities, hands, feet, fingers, toes, fingernails, and toenails. All findings within normal limits unless otherwise noted below.   Relevant physical exam findings are noted in the Assessment and Plan.  R bicep x 1, back x 6 (7) Erythematous stuck-on, waxy papule or plaque  Assessment & Plan   SKIN CANCER SCREENING PERFORMED TODAY.  ACTINIC DAMAGE WITH PRECANCEROUS ACTINIC KERATOSES Counseling for Topical Chemotherapy Management: Patient exhibits: - Severe, confluent actinic changes with pre-cancerous actinic keratoses that is secondary to cumulative UV radiation exposure over time - Condition that is severe; chronic, not at goal. - diffuse scaly erythematous macules and papules with underlying dyspigmentation - Discussed Prescription Field Treatment topical Chemotherapy for Severe, Chronic Confluent Actinic Changes with Pre-Cancerous Actinic  Keratoses Field treatment involves treatment of an entire area of skin that has confluent Actinic Changes (Sun/ Ultraviolet light damage) and PreCancerous Actinic Keratoses by method of PhotoDynamic Therapy (PDT) and/or prescription Topical Chemotherapy agents such as 5-fluorouracil , 5-fluorouracil /calcipotriene , and/or imiquimod.  The purpose is to decrease the number of clinically evident and subclinical PreCancerous lesions to prevent progression to development of skin cancer by chemically destroying early precancer changes that may or may not be visible.  It has been shown to reduce the risk of developing skin cancer in the treated area. As a result of treatment, redness, scaling, crusting, and open sores may occur during treatment course. One or more than one of these methods may be used and may have to be used several times to control, suppress and eliminate the PreCancerous changes. Discussed treatment course, expected reaction, and possible side effects. - Recommend daily broad spectrum sunscreen SPF 30+ to sun-exposed areas, reapply every 2 hours as needed.  - Staying in the shade or wearing long sleeves, sun glasses (UVA+UVB protection) and wide brim hats (4-inch brim around the entire circumference of the hat) are also recommended. - Call for new or changing lesions. - Start 5-fluorouracil /calcipotriene  cream twice a day for up to 7 days to affected areas including forehead and temples. 3 weeks later treat cheeks twice a day for up to 7 days and 3 weeks after that treat nose, upper lip and chin twice a day for up to 7 days. In January apply cream twice daily for up to 7 days at chest. Prescription sent to [facility]. Patient advised they will receive an email to purchase the medication online and have it sent to their home. Patient provided with handout  reviewing treatment course and side effects and advised to call or message us  on MyChart with any concerns.  Reviewed  course of treatment and expected reaction.  Patient advised to expect inflammation and crusting and advised that erosions are possible.  Patient advised to be diligent with sun protection during and after treatment. Counseled to keep medication out of reach of children and pets.  In September start forehead, temples, 3 weeks later start cheekc, 3 weeks later treat nose, upper lip and chin  LENTIGINES, SEBORRHEIC KERATOSES, HEMANGIOMAS - Benign normal skin lesions - Benign-appearing - Call for any changes  MELANOCYTIC NEVI - Tan-brown and/or pink-flesh-colored symmetric macules and papules - Benign appearing on exam today - Observation - Call clinic for new or changing moles - Recommend daily use of broad spectrum spf 30+ sunscreen to sun-exposed areas.   HISTORY OF SQUAMOUS CELL CARCINOMA OF THE SKIN - No evidence of recurrence today - No lymphadenopathy - Recommend regular full body skin exams - Recommend daily broad spectrum sunscreen SPF 30+ to sun-exposed areas, reapply every 2 hours as needed.  - Call if any new or changing lesions are noted between office visits  History of Dysplastic Nevi - No evidence of recurrence today - Recommend regular full body skin exams - Recommend daily broad spectrum sunscreen SPF 30+ to sun-exposed areas, reapply every 2 hours as needed.  - Call if any new or changing lesions are noted between office visits  AK (ACTINIC KERATOSIS) (19) L forearm x 1, L deltoid x 1, R hand x 1, face x 12, neck x 1, L pretibial x 3 (19) Actinic keratoses are precancerous spots that appear secondary to cumulative UV radiation exposure/sun exposure over time. They are chronic with expected duration over 1 year. A portion of actinic keratoses will progress to squamous cell carcinoma of the skin. It is not possible to reliably predict which spots will progress to skin cancer and so treatment is recommended to prevent development of skin cancer.  Recommend daily broad  spectrum sunscreen SPF 30+ to sun-exposed areas, reapply every 2 hours as needed.  Recommend staying in the shade or wearing long sleeves, sun glasses (UVA+UVB protection) and wide brim hats (4-inch brim around the entire circumference of the hat). Call for new or changing lesions.  Recheck hypertrophic AK's at left pretibial in 3 months. Consider bx on follow up if indicated.  Destruction of lesion - L forearm x 1, L deltoid x 1, R hand x 1, face x 12, neck x 1, L pretibial x 3 (19) Complexity: simple   Destruction method: cryotherapy   Informed consent: discussed and consent obtained   Timeout:  patient name, date of birth, surgical site, and procedure verified Lesion destroyed using liquid nitrogen: Yes   Region frozen until ice ball extended beyond lesion: Yes   Outcome: patient tolerated procedure well with no complications   Post-procedure details: wound care instructions given    INFLAMED SEBORRHEIC KERATOSIS (7) R bicep x 1, back x 6 (7) Symptomatic, irritating, patient would like treated.  Benign-appearing.  Call clinic for new or changing lesions.   Destruction of lesion - R bicep x 1, back x 6 (7) Complexity: simple   Destruction method: cryotherapy   Informed consent: discussed and consent obtained   Timeout:  patient name, date of birth, surgical site, and procedure verified Lesion destroyed using liquid nitrogen: Yes   Region frozen until ice ball extended beyond lesion: Yes   Outcome: patient tolerated procedure well with no  complications   Post-procedure details: wound care instructions given    Return in about 3 months (around 04/14/2024) for AK follow up.  LILLETTE Lonell Drones, RMA, am acting as scribe for Alm Rhyme, MD .   Documentation: I have reviewed the above documentation for accuracy and completeness, and I agree with the above.  Alm Rhyme, MD

## 2024-01-14 ENCOUNTER — Telehealth: Payer: Self-pay

## 2024-01-14 NOTE — Telephone Encounter (Signed)
 Patient called regarding 5FU/Calcipotriene  Cream. She does not want to purchase from [facility] and wants this through [facility] with her insurance. Advised patient this is a compounded product. She would like to purchase two separate RXS. Okay to send in?

## 2024-01-15 MED ORDER — FLUOROURACIL 5 % EX CREA: TOPICAL_CREAM | Freq: Two times a day (BID) | CUTANEOUS | 1 refills | Status: AC

## 2024-01-15 MED ORDER — CALCIPOTRIENE 0.005 % EX CREA
TOPICAL_CREAM | Freq: Two times a day (BID) | CUTANEOUS | 1 refills | Status: AC
Start: 2024-01-15 — End: ?

## 2024-01-15 NOTE — Telephone Encounter (Signed)
 Patient advised of information and prescriptions sent in. aw

## 2024-01-20 ENCOUNTER — Telehealth (INDEPENDENT_AMBULATORY_CARE_PROVIDER_SITE_OTHER): Payer: Self-pay

## 2024-01-20 NOTE — Telephone Encounter (Signed)
 Patient left a message stating that she is having left leg swelling, numbness between the knee and foot, and some pain in the groin. Patient states that the swelling has been going on sometime but last week the swelling increased. She stated that the left ankle was very swollen and she did elevation and then the swelling went down. Patient using compression most of the time. Patient would like to know if she should be seen in our office or PCP. Please Advise

## 2024-01-20 NOTE — Telephone Encounter (Signed)
 Patient notified with medical recommendations and would like to move forward with scheduling appt

## 2024-01-20 NOTE — Telephone Encounter (Signed)
 We can see her but I would recommend that she see her PCP too as some of her pain may not be vascular related (such as the groin pain).  She should come in for ABIs and a left venous reflux

## 2024-01-22 ENCOUNTER — Other Ambulatory Visit (INDEPENDENT_AMBULATORY_CARE_PROVIDER_SITE_OTHER): Payer: Self-pay | Admitting: Nurse Practitioner

## 2024-01-22 DIAGNOSIS — M7989 Other specified soft tissue disorders: Secondary | ICD-10-CM

## 2024-01-22 DIAGNOSIS — R200 Anesthesia of skin: Secondary | ICD-10-CM

## 2024-01-22 DIAGNOSIS — R1032 Left lower quadrant pain: Secondary | ICD-10-CM

## 2024-01-24 ENCOUNTER — Encounter: Payer: Self-pay | Admitting: Advanced Practice Midwife

## 2024-01-27 ENCOUNTER — Ambulatory Visit (INDEPENDENT_AMBULATORY_CARE_PROVIDER_SITE_OTHER): Payer: Self-pay

## 2024-01-27 DIAGNOSIS — M7989 Other specified soft tissue disorders: Secondary | ICD-10-CM | POA: Diagnosis not present

## 2024-01-27 DIAGNOSIS — R200 Anesthesia of skin: Secondary | ICD-10-CM

## 2024-01-27 DIAGNOSIS — R1032 Left lower quadrant pain: Secondary | ICD-10-CM | POA: Diagnosis not present

## 2024-01-28 ENCOUNTER — Ambulatory Visit (INDEPENDENT_AMBULATORY_CARE_PROVIDER_SITE_OTHER): Payer: Self-pay | Admitting: Nurse Practitioner

## 2024-01-28 ENCOUNTER — Encounter (INDEPENDENT_AMBULATORY_CARE_PROVIDER_SITE_OTHER): Payer: Self-pay | Admitting: Nurse Practitioner

## 2024-01-28 VITALS — BP 136/81 | HR 71 | Ht 64.0 in | Wt 171.0 lb

## 2024-01-28 DIAGNOSIS — I8310 Varicose veins of unspecified lower extremity with inflammation: Secondary | ICD-10-CM

## 2024-01-28 DIAGNOSIS — R200 Anesthesia of skin: Secondary | ICD-10-CM

## 2024-01-28 DIAGNOSIS — E782 Mixed hyperlipidemia: Secondary | ICD-10-CM

## 2024-01-28 LAB — VAS US ABI WITH/WO TBI
Left ABI: 1.05
Right ABI: 1.1

## 2024-01-28 NOTE — Progress Notes (Signed)
 Subjective:    Patient ID: Aimee Lawson, female    DOB: [birth year], 63 y.o.   MRN: [id number] Chief Complaint  Patient presents with   fu ABIs and a left venous reflux (scheduled 01/27/24).   Leg Problem    Left calf numbness     The patient presents today for evaluation of numbness, pain and swelling in her left lower extremity.  She notes that the swelling has been going on for some time but is recently worsened.  She notes that there are lumpy nodules on her leg that are tender which are actually varicosities.  She also has some numbness running from her knee to her thigh area.  She does have a history of knee issues and she does work on her feet for extended periods of time.  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  She currently denies any open wounds or ulcerations.  She notes that the pain and discomfort from the nodules has come significant and it is now causing her some ongoing issues which make it difficult for her to continue her activities.  Today noninvasive studies show an ABI of 1.10 on the right and 1.05 on the left.  She has strong triphasic tibial artery waveforms bilaterally with normal toe waveforms.  Additional left lower extremity arterial duplex was done that showed no evidence of DVT or superficial phlebitis.  Her previous reflux study showed no significant evidence of reflux after her endovenous laser ablation however today it is noted that she has significant reflux in her left anterior accessory saphenous vein.    Review of Systems  Cardiovascular:  Positive for leg swelling.  Musculoskeletal:  Positive for arthralgias.  All other systems reviewed and are negative.      Objective:   Physical Exam Vitals reviewed.  HENT:     Head: Normocephalic.  Cardiovascular:     Rate and Rhythm: Normal rate.     Pulses: Normal pulses.  Pulmonary:     Effort: Pulmonary effort is normal.  Musculoskeletal:        General:  Tenderness present.  Skin:    General: Skin is warm and dry.  Neurological:     Mental Status: She is alert and oriented to person, place, and time.  Psychiatric:        Mood and Affect: Mood normal.        Behavior: Behavior normal.        Thought Content: Thought content normal.        Judgment: Judgment normal.     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   Aortic atherosclerosis (HCC)    Atypical mole 11/04/2013   Right superior lateral pubic   Emphysema lung (HCC)    Emphysema of lung (HCC) 2022   Mixed hyperlipidemia    Squamous cell carcinoma of skin 10/31/2017   right pretibial    Symptomatic cholelithiasis     Social History   Socioeconomic History   Marital status: Single    Spouse name: Not on file   Number of children: Not on file   Years of education: Not on file   Highest education level: Bachelor's degree (e.g., BA, AB, BS)  Occupational History   Not on file  Tobacco Use   Smoking status: Every Day    Current packs/day: 0.50    Average packs/day: 0.7 packs/day for 66.0 years (44.0 ttl pk-yrs)    Types: Cigarettes    Passive exposure: Past   Smokeless tobacco: Never  Vaping Use   Vaping status: Never Used  Substance and Sexual Activity   Alcohol use: Yes    Alcohol/week: 4.0 standard drinks of alcohol    Types: 4 Cans of beer per week   Drug use: Never   Sexual activity: Not Currently    Birth control/protection: None  Other Topics Concern   Not on file  Social History Narrative   Not on file   Social Drivers of Health   Financial Resource Strain: Low Risk  (07/12/2023)   Overall Financial Resource Strain (CARDIA)    Difficulty of Paying Living Expenses: Not hard at all  Food Insecurity: No Food Insecurity (07/12/2023)   Hunger Vital Sign    Worried About Running Out of Food in the Last Year:  Never true    Ran Out of Food in the Last Year: Never true  Transportation Needs: No Transportation Needs (07/12/2023)   PRAPARE - Administrator, Civil Service (Medical): No    Lack of Transportation (Non-Medical): No  Physical Activity: Insufficiently Active (07/12/2023)   Exercise Vital Sign    Days of Exercise per Week: 1 day    Minutes of Exercise per Session: 10 min  Stress: No Stress Concern Present (07/12/2023)   [employer] of Occupational Health - Occupational Stress Questionnaire    Feeling of Stress : Not at all  Social Connections: Moderately Isolated (07/12/2023)   Social Connection and Isolation Panel    Frequency of Communication with Friends and Family: More than three times a week    Frequency of Social Gatherings with Friends and Family: More than three times a week    Attends Religious Services: More than 4 times per year    Active Member of [employer] or Organizations: No    Attends Engineer, structural: Not on file    Marital Status: Divorced  Catering manager Violence: Not on file    Past Surgical History:  Procedure Laterality Date   AUGMENTATION MAMMAPLASTY     BREAST ENHANCEMENT SURGERY     20+ yrs ago   BREAST SURGERY     CHOLECYSTECTOMY  7/24   COLONOSCOPY N/A 02/21/2021   Procedure: COLONOSCOPY;  Surgeon: Therisa Bi, MD;  Location: [facility] ENDOSCOPY;  Service: Gastroenterology;  Laterality: N/A;   COSMETIC SURGERY  2001   breast augmentation   FACIAL COSMETIC SURGERY      Family History  Problem Relation Age of Onset   Heart attack Mother    Stroke Mother    Uterine cancer Mother    Colon cancer Mother    Cancer Mother    Obesity Mother    Heart attack Father    Hypertension Brother    Other Brother        Pacemaker   Emphysema Brother    COPD Brother    Diabetes Maternal Grandmother    Heart disease Maternal Grandmother    Heart disease Maternal Grandfather    Heart disease Paternal Grandmother    Heart disease Paternal  Grandfather    Sleep apnea Neg Hx     Allergies  Allergen Reactions   Penicillins Anaphylaxis       Latest Ref Rng & Units 07/15/2023    3:27 PM 02/20/2023  10:46 AM 01/28/2023    1:15 PM  CBC  WBC 3.4 - 10.8 x10E3/uL 7.4  6.3  5.7   Hemoglobin 11.1 - 15.9 g/dL 86.2  87.1  86.6   Hematocrit 34.0 - 46.6 % 41.6  37.7  38.8   Platelets 150 - 450 x10E3/uL 231  237  225       CMP     Component Value Date/Time   NA 144 07/15/2023 1527   K 4.0 07/15/2023 1527   CL 105 07/15/2023 1527   CO2 25 07/15/2023 1527   GLUCOSE 76 07/15/2023 1527   GLUCOSE 125 (H) 01/28/2023 1315   BUN 12 07/15/2023 1527   CREATININE 1.05 (H) 07/15/2023 1527   CALCIUM  9.1 07/15/2023 1527   PROT 6.5 07/15/2023 1527   ALBUMIN 4.4 07/15/2023 1527   AST 13 07/15/2023 1527   ALT 10 07/15/2023 1527   ALKPHOS 92 07/15/2023 1527   BILITOT 0.5 07/15/2023 1527   EGFR 59 (L) 07/15/2023 1527   GFRNONAA >60 01/28/2023 1315     VAS US  ABI WITH/WO TBI Result Date: 01/28/2024  LOWER EXTREMITY DOPPLER STUDY Patient Name:  AVIANCE COOPERWOOD  Date of Exam:   01/27/2024 Medical Rec #: [id number]         Accession #:    [id number] Date of Birth: 1960-09-25         Patient Gender: F Patient Age:   54 years Exam Location:  [facility] Procedure:      VAS US  ABI WITH/WO TBI Referring Phys: ORVIN Anabelle Bungert --------------------------------------------------------------------------------  High Risk Factors: Hyperlipidemia, current smoker.  Performing Technologist: Donnice Charnley RVT  Examination Guidelines: A complete evaluation includes at minimum, Doppler waveform signals and systolic blood pressure reading at the level of bilateral brachial, anterior tibial, and posterior tibial arteries, when vessel segments are accessible. Bilateral testing is considered an integral part of a complete examination. Photoelectric Plethysmograph (PPG) waveforms and toe systolic pressure readings are included as required and additional duplex  testing as needed. Limited examinations for reoccurring indications may be performed as noted.  ABI Findings: +---------+------------------+-----+---------+--------+ Right    Rt Pressure (mmHg)IndexWaveform Comment  +---------+------------------+-----+---------+--------+ Brachial 175                                      +---------+------------------+-----+---------+--------+ PTA      192               1.10 triphasic         +---------+------------------+-----+---------+--------+ DP       189               1.08 biphasic          +---------+------------------+-----+---------+--------+ Great Toe151               0.86                   +---------+------------------+-----+---------+--------+ +---------+------------------+-----+---------+-------+ Left     Lt Pressure (mmHg)IndexWaveform Comment +---------+------------------+-----+---------+-------+ Brachial 170                                     +---------+------------------+-----+---------+-------+ PTA      184               1.05 triphasic        +---------+------------------+-----+---------+-------+ DP  181               1.03 triphasic        +---------+------------------+-----+---------+-------+ Great Toe140               0.80                  +---------+------------------+-----+---------+-------+ +-------+-----------+-----------+------------+------------+ ABI/TBIToday's ABIToday's TBIPrevious ABIPrevious TBI +-------+-----------+-----------+------------+------------+ Right  1.10       0.86                                +-------+-----------+-----------+------------+------------+ Left   1.05       0.83                                +-------+-----------+-----------+------------+------------+   Summary: Right: Resting right ankle-brachial index is within normal range. The right toe-brachial index is normal. Left: Resting left ankle-brachial index is within normal range. The left  toe-brachial index is normal. *See table(s) above for measurements and observations.  Electronically signed by Selinda Gu MD on 01/28/2024 at 7:32:09 AM.    Final        Assessment & Plan:   1. Leg numbness (Primary) Today the patient had ABIs which were normal in nature.  At further numbness is related to vascular issues.  She does have history of some lower back pain which may be contributing to the numbness.  In addition I suspect that her knee may also be contributing to the swelling in addition to the varicosity.  2. Varicose veins with inflammation Recommend:  The patient has had successful ablation of the previously incompetent saphenous venous system but still has persistent symptoms of pain and swelling that are having a negative impact on daily life and daily activities.  Patient should undergo foam injection sclerotherapy to treat the residual varicosities.  The risks, benefits and alternative therapies were reviewed in detail with the patient.  All questions were answered.  The patient agrees to proceed with foam sclerotherapy at their convenience.  The patient will continue wearing the graduated compression stockings and using the over-the-counter pain medications to treat her symptoms.      3. Mixed hyperlipidemia Continue statin as ordered and reviewed, no changes at this time   Current Outpatient Medications on File Prior to Visit  Medication Sig Dispense Refill   albuterol  (VENTOLIN  HFA) 108 (90 Base) MCG/ACT inhaler Inhale 2 puffs into the lungs every 6 (six) hours as needed for wheezing or shortness of breath. 18 g 4   atorvastatin  (LIPITOR) 20 MG tablet Take 1 tablet (20 mg total) by mouth daily. 90 tablet 4   calcipotriene  (DOVONOX) 0.005 % cream Apply topically 2 (two) times daily. For seven days 60 g 1   fluorouracil  (EFUDEX ) 5 % cream Apply topically 2 (two) times daily. For seven days 40 g 1   tirzepatide (MOUNJARO) 5 MG/0.5ML Pen Inject 5 mg into the skin once  a week.     umeclidinium-vilanterol (ANORO ELLIPTA ) 62.5-25 MCG/ACT AEPB INHALE 1 PUFF BY MOUTH EVERY DAY 180 each 4   No current facility-administered medications on file prior to visit.    There are no Patient Instructions on file for this visit. No follow-ups on file.   Zeya Balles E Lindzee Gouge, NP

## 2024-02-04 ENCOUNTER — Telehealth (INDEPENDENT_AMBULATORY_CARE_PROVIDER_SITE_OTHER): Payer: Self-pay | Admitting: Vascular Surgery

## 2024-02-04 NOTE — Telephone Encounter (Signed)
 LVM for pt TCB and schedule FOAM sclero appt with Dr. Jama.   left leg FOAM sclero. see gs. no prior shara champagne- decision #[id number] - 2 units

## 2024-02-12 ENCOUNTER — Other Ambulatory Visit: Payer: Self-pay | Admitting: Nurse Practitioner

## 2024-02-12 DIAGNOSIS — Z1231 Encounter for screening mammogram for malignant neoplasm of breast: Secondary | ICD-10-CM

## 2024-02-22 NOTE — Patient Instructions (Incomplete)
 Be Involved in Caring For Your Health:  Taking Medications When medications are taken as directed, they can greatly improve your health. But if they are not taken as prescribed, they may not work. In some cases, not taking them correctly can be harmful. To help ensure your treatment remains effective and safe, understand your medications and how to take them. Bring your medications to each visit for review by your provider.  Your lab results, notes, and after visit summary will be available on My Chart. We strongly encourage you to use this feature. If lab results are abnormal the clinic will contact you with the appropriate steps. If the clinic does not contact you assume the results are satisfactory. You can always view your results on My Chart. If you have questions regarding your health or results, please contact the clinic during office hours. You can also ask questions on My Chart.  We at [facility] are grateful that you chose [place] to provide your care. We strive to provide evidence-based and compassionate care and are always looking for feedback. If you get a survey from the clinic please complete this so we can hear your opinions.  Heart-Healthy Eating Plan Many factors influence your heart health, including eating and exercise habits. Heart health is also called coronary health. Coronary risk increases with abnormal blood fat (lipid) levels. A heart-healthy eating plan includes limiting unhealthy fats, increasing healthy fats, limiting salt (sodium) intake, and making other diet and lifestyle changes. What is my plan? Your health care provider may recommend that: You limit your fat intake to _________% or less of your total calories each day. You limit your saturated fat intake to _________% or less of your total calories each day. You limit the amount of cholesterol in your diet to less than _________ mg per day. You limit the amount of sodium in your diet to less than _________  mg per day. What are tips for following this plan? Cooking Cook foods using methods other than frying. Baking, boiling, grilling, and broiling are all good options. Other ways to reduce fat include: Removing the skin from poultry. Removing all visible fats from meats. Steaming vegetables in water or broth. Meal planning  At meals, imagine dividing your plate into fourths: Fill one-half of your plate with vegetables and green salads. Fill one-fourth of your plate with whole grains. Fill one-fourth of your plate with lean protein foods. Eat 2-4 cups of vegetables per day. One cup of vegetables equals 1 cup (91 g) broccoli or cauliflower florets, 2 medium carrots, 1 large bell pepper, 1 large sweet potato, 1 large tomato, 1 medium white potato, 2 cups (150 g) raw leafy greens. Eat 1-2 cups of fruit per day. One cup of fruit equals 1 small apple, 1 large banana, 1 cup (237 g) mixed fruit, 1 large orange,  cup (82 g) dried fruit, 1 cup (240 mL) 100% fruit juice. Eat more foods that contain soluble fiber. Examples include apples, broccoli, carrots, beans, peas, and barley. Aim to get 25-30 g of fiber per day. Increase your consumption of legumes, nuts, and seeds to 4-5 servings per week. One serving of dried beans or legumes equals  cup (90 g) cooked, 1 serving of nuts is  oz (12 almonds, 24 pistachios, or 7 walnut halves), and 1 serving of seeds equals  oz (8 g). Fats Choose healthy fats more often. Choose monounsaturated and polyunsaturated fats, such as olive and canola oils, avocado oil, flaxseeds, walnuts, almonds, and seeds. Eat  more omega-3 fats. Choose salmon, mackerel, sardines, tuna, flaxseed oil, and ground flaxseeds. Aim to eat fish at least 2 times each week. Check food labels carefully to identify foods with trans fats or high amounts of saturated fat. Limit saturated fats. These are found in animal products, such as meats, butter, and cream. Plant sources of saturated fats  include palm oil, palm kernel oil, and coconut oil. Avoid foods with partially hydrogenated oils in them. These contain trans fats. Examples are stick margarine, some tub margarines, cookies, crackers, and other baked goods. Avoid fried foods. General information Eat more home-cooked food and less restaurant, buffet, and fast food. Limit or avoid alcohol. Limit foods that are high in added sugar and simple starches such as foods made using white refined flour (white breads, pastries, sweets). Lose weight if you are overweight. Losing just 5-10% of your body weight can help your overall health and prevent diseases such as diabetes and heart disease. Monitor your sodium intake, especially if you have high blood pressure. Talk with your health care provider about your sodium intake. Try to incorporate more vegetarian meals weekly. What foods should I eat? Fruits All fresh, canned (in natural juice), or frozen fruits. Vegetables Fresh or frozen vegetables (raw, steamed, roasted, or grilled). Green salads. Grains Most grains. Choose whole wheat and whole grains most of the time. Rice and pasta, including brown rice and pastas made with whole wheat. Meats and other proteins Lean, well-trimmed beef, veal, pork, and lamb. Chicken and [place] without skin. All fish and shellfish. Wild duck, rabbit, pheasant, and venison. Egg whites or low-cholesterol egg substitutes. Dried beans, peas, lentils, and tofu. Seeds and most nuts. Dairy Low-fat or nonfat cheeses, including ricotta and mozzarella. Skim or 1% milk (liquid, powdered, or evaporated). Buttermilk made with low-fat milk. Nonfat or low-fat yogurt. Fats and oils Non-hydrogenated (trans-free) margarines. Vegetable oils, including soybean, sesame, sunflower, olive, avocado, peanut, safflower, corn, canola, and cottonseed. Salad dressings or mayonnaise made with a vegetable oil. Beverages Water (mineral or sparkling). Coffee and tea. Unsweetened ice  tea. Diet beverages. Sweets and desserts Sherbet, gelatin, and fruit ice. Small amounts of dark chocolate. Limit all sweets and desserts. Seasonings and condiments All seasonings and condiments. The items listed above may not be a complete list of foods and beverages you can eat. Contact a dietitian for more options. What foods should I avoid? Fruits Canned fruit in heavy syrup. Fruit in cream or butter sauce. Fried fruit. Limit coconut. Vegetables Vegetables cooked in cheese, cream, or butter sauce. Fried vegetables. Grains Breads made with saturated or trans fats, oils, or whole milk. Croissants. Sweet rolls. Donuts. High-fat crackers, such as cheese crackers and chips. Meats and other proteins Fatty meats, such as hot dogs, ribs, sausage, bacon, rib-eye roast or steak. High-fat deli meats, such as salami and bologna. Caviar. Domestic duck and goose. Organ meats, such as liver. Dairy Cream, sour cream, cream cheese, and creamed cottage cheese. Whole-milk cheeses. Whole or 2% milk (liquid, evaporated, or condensed). Whole buttermilk. Cream sauce or high-fat cheese sauce. Whole-milk yogurt. Fats and oils Meat fat, or shortening. Cocoa butter, hydrogenated oils, palm oil, coconut oil, palm kernel oil. Solid fats and shortenings, including bacon fat, salt pork, lard, and butter. Nondairy cream substitutes. Salad dressings with cheese or sour cream. Beverages Regular sodas and any drinks with added sugar. Sweets and desserts Frosting. Pudding. Cookies. Cakes. Pies. Milk chocolate or white chocolate. Buttered syrups. Full-fat ice cream or ice cream drinks. The items listed above may  not be a complete list of foods and beverages to avoid. Contact a dietitian for more information. Summary Heart-healthy meal planning includes limiting unhealthy fats, increasing healthy fats, limiting salt (sodium) intake and making other diet and lifestyle changes. Lose weight if you are overweight. Losing just  5-10% of your body weight can help your overall health and prevent diseases such as diabetes and heart disease. Focus on eating a balance of foods, including fruits and vegetables, low-fat or nonfat dairy, lean protein, nuts and legumes, whole grains, and heart-healthy oils and fats. This information is not intended to replace advice given to you by your health care provider. Make sure you discuss any questions you have with your health care provider. Document Revised: 07/31/2021 Document Reviewed: 07/31/2021 Elsevier Patient Education  2024 [employer].

## 2024-02-26 ENCOUNTER — Other Ambulatory Visit: Payer: Self-pay | Admitting: Nurse Practitioner

## 2024-02-26 ENCOUNTER — Ambulatory Visit: Payer: Self-pay | Admitting: Nurse Practitioner

## 2024-02-26 ENCOUNTER — Encounter: Payer: Self-pay | Admitting: Nurse Practitioner

## 2024-02-26 VITALS — BP 124/70 | HR 67 | Temp 98.2°F | Ht 64.5 in | Wt 162.4 lb

## 2024-02-26 DIAGNOSIS — I700 Atherosclerosis of aorta: Secondary | ICD-10-CM | POA: Diagnosis not present

## 2024-02-26 DIAGNOSIS — Z1211 Encounter for screening for malignant neoplasm of colon: Secondary | ICD-10-CM

## 2024-02-26 DIAGNOSIS — J432 Centrilobular emphysema: Secondary | ICD-10-CM | POA: Diagnosis not present

## 2024-02-26 DIAGNOSIS — R0989 Other specified symptoms and signs involving the circulatory and respiratory systems: Secondary | ICD-10-CM | POA: Diagnosis not present

## 2024-02-26 DIAGNOSIS — Z23 Encounter for immunization: Secondary | ICD-10-CM

## 2024-02-26 DIAGNOSIS — Z0000 Encounter for general adult medical examination without abnormal findings: Secondary | ICD-10-CM

## 2024-02-26 DIAGNOSIS — E782 Mixed hyperlipidemia: Secondary | ICD-10-CM | POA: Diagnosis not present

## 2024-02-26 DIAGNOSIS — F17210 Nicotine dependence, cigarettes, uncomplicated: Secondary | ICD-10-CM

## 2024-02-26 DIAGNOSIS — E538 Deficiency of other specified B group vitamins: Secondary | ICD-10-CM

## 2024-02-26 DIAGNOSIS — Z780 Asymptomatic menopausal state: Secondary | ICD-10-CM

## 2024-02-26 DIAGNOSIS — K5909 Other constipation: Secondary | ICD-10-CM

## 2024-02-26 DIAGNOSIS — R8281 Pyuria: Secondary | ICD-10-CM

## 2024-02-26 LAB — URINALYSIS, ROUTINE W REFLEX MICROSCOPIC
Bilirubin, UA: NEGATIVE
Glucose, UA: NEGATIVE
Ketones, UA: NEGATIVE
Leukocytes,UA: NEGATIVE
Nitrite, UA: POSITIVE — AB
Protein,UA: NEGATIVE
RBC, UA: NEGATIVE
Specific Gravity, UA: 1.01 (ref 1.005–1.030)
Urobilinogen, Ur: 0.2 mg/dL (ref 0.2–1.0)
pH, UA: 5.5 (ref 5.0–7.5)

## 2024-02-26 LAB — MICROSCOPIC EXAMINATION

## 2024-02-26 MED ORDER — AZITHROMYCIN 250 MG PO TABS: ORAL_TABLET | ORAL | 0 refills | Status: AC

## 2024-02-26 MED ORDER — ATORVASTATIN CALCIUM 20 MG PO TABS: 20.0000 mg | ORAL_TABLET | Freq: Every day | ORAL | 4 refills | Status: AC

## 2024-02-26 MED ORDER — NITROFURANTOIN MONOHYD MACRO 100 MG PO CAPS: 100.0000 mg | ORAL_CAPSULE | Freq: Two times a day (BID) | ORAL | 0 refills | Status: DC

## 2024-02-26 MED ORDER — ALBUTEROL SULFATE HFA 108 (90 BASE) MCG/ACT IN AERS: 2.0000 | INHALATION_SPRAY | Freq: Four times a day (QID) | RESPIRATORY_TRACT | 4 refills | Status: AC | PRN

## 2024-02-26 MED ORDER — UMECLIDINIUM-VILANTEROL 62.5-25 MCG/ACT IN AEPB: INHALATION_SPRAY | RESPIRATORY_TRACT | 4 refills | Status: AC

## 2024-02-26 NOTE — Assessment & Plan Note (Signed)
 Chronic, taking supplement via injections with [place] weight loss.  Check level today.

## 2024-02-26 NOTE — Assessment & Plan Note (Signed)
 Ongoing and stable.  Continue Atorvastatin 10 MG daily.  Check lipid panel and CMP today.  Recommend diet changes and modest weight loss.

## 2024-02-26 NOTE — Assessment & Plan Note (Signed)
Chronic.  Noted on lung CT screening.  Continue statin and ASA daily for prevention purposes.  Recommend complete cessation of smoking.

## 2024-02-26 NOTE — Addendum Note (Signed)
 Addended by: Zella Dewan T on: 02/26/2024 02:20 PM   Modules accepted: Orders

## 2024-02-26 NOTE — Progress Notes (Signed)
 BP 124/70 (BP Location: Left Arm, Patient Position: Sitting, Cuff Size: Normal)   Pulse 67   Temp 98.2 F (36.8 C) (Oral)   Ht 5' 4.5 (1.638 m)   Wt 162 lb 6.4 oz (73.7 kg)   LMP 09/07/2010 (Approximate)   SpO2 97%   BMI 27.45 kg/m    Subjective:    Patient ID: Aimee Lawson, female    DOB: 1961-01-13, 63 y.o.   MRN: [id number]  HPI: Aimee Lawson is a 63 y.o. female presenting on 02/26/2024 for comprehensive medical examination. Current medical complaints include:none  She currently lives with: husband Menopausal Symptoms: no  Sees [address] obtains Mounjaro and B12 injections for weight loss.  COPD Using Anoro daily and Albuterol . Last lung screening was on 03/12/23 and continues to note centrilobular emphysema and aortic atherosclerosis. Smokes 1 PPD, sometimes less.  Has been smoking since age 56.   COPD status: controlled Satisfied with current treatment?: yes Oxygen use: no Dyspnea frequency: no Cough frequency: no Rescue inhaler frequency:  none recent Limitation of activity: no Productive cough: none Last Spirometry: 08/22/22 last Pneumovax: Up to Date Influenza: Up to Date  HYPERLIPIDEMIA Takes Atorvastatin  daily, sometimes misses doses.  Saw vascular in 2021 for bilateral bruits noted.  Imaging with <50% stenosis, comparable to previous studies -- no intervention.  To return to vascular in 5 years. Hyperlipidemia status: good compliance Satisfied with current treatment?  yes Side effects:  no Medication compliance: good compliance Supplements: none Aspirin:  no The 10-year ASCVD risk score (Arnett DK, et al., 2019) is: 8.1%   Values used to calculate the score:     Age: 22 years     Clincally relevant sex: Female     Is Non-Hispanic African American: No     Diabetic: No     Tobacco smoker: Yes     Systolic Blood Pressure: 124 mmHg     Is BP treated: No     HDL Cholesterol: 64 mg/dL     Total Cholesterol: 162 mg/dL Chest  pain:  no Coronary artery disease:  no Family history CAD:  yes - stroke and MI, her brother had open heart surgery recently Family history early CAD:  no   Depression Screen done today and results listed below:     07/15/2023    3:00 PM 02/20/2023   10:06 AM 08/22/2022    2:25 PM 01/22/2022    1:14 PM 11/22/2021    2:32 PM  Depression screen PHQ 2/9  Decreased Interest 0 0 0 0 0  Down, Depressed, Hopeless 0 0 0 0 0  PHQ - 2 Score 0 0 0 0 0  Altered sleeping 0 0 0 0 0  Tired, decreased energy 0 2 0 1 0  Change in appetite 0 2 0 2 0  Feeling bad or failure about yourself  0 0 0 0 0  Trouble concentrating 0 0 0 0 0  Moving slowly or fidgety/restless 0 0 0 0 0  Suicidal thoughts 0 0 0 0 0  PHQ-9 Score 0 4 0 3 0  Difficult doing work/chores Not difficult at all Not difficult at all Not difficult at all Not difficult at all       07/15/2023    3:01 PM 02/20/2023   10:07 AM 08/22/2022    2:25 PM 01/22/2022    1:15 PM  GAD 7 : Generalized Anxiety Score  Nervous, Anxious, on Edge 0 0 0 0  Control/stop worrying 0  0 0 0  Worry too much - different things 0 0 0 0  Trouble relaxing 0 0 0 0  Restless 0 0 0 0  Easily annoyed or irritable 0 0 0 0  Afraid - awful might happen 0 0 0 0  Total GAD 7 Score 0 0 0 0  Anxiety Difficulty Not difficult at all Not difficult at all Not difficult at all Not difficult at all      11/22/2021    2:32 PM 01/22/2022    1:13 PM 08/22/2022    2:25 PM 02/20/2023   10:06 AM 07/15/2023    3:00 PM  Fall Risk  Falls in the past year? 0 0 0 0 0  Was there an injury with Fall? 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   Aortic atherosclerosis (HCC)    Atypical mole 11/04/2013   Right superior lateral pubic   Emphysema lung (HCC)    Emphysema of lung (HCC) 2022   Mixed hyperlipidemia    Squamous cell carcinoma of skin 10/31/2017   right pretibial    Symptomatic cholelithiasis     Surgical History:  Past Surgical History:  Procedure Laterality Date   AUGMENTATION MAMMAPLASTY     BREAST ENHANCEMENT SURGERY     20+ yrs ago   BREAST SURGERY     CHOLECYSTECTOMY  7/24   COLONOSCOPY N/A 02/21/2021   Procedure: COLONOSCOPY;  Surgeon: Therisa Bi, MD;  Location: [facility]COPY;  Service: Gastroenterology;  Laterality: N/A;   COSMETIC SURGERY  2001   breast augmentation   FACIAL COSMETIC SURGERY      Medications:  Current Outpatient Medications on File Prior to Visit  Medication Sig   calcipotriene  (DOVONOX) 0.005 % cream Apply topically 2 (two) times daily. For seven days   fluorouracil  (EFUDEX ) 5 % cream Apply topically 2 (two) times daily. For seven days   tirzepatide (MOUNJARO) 5 MG/0.5ML Pen Inject 5 mg into the skin once a week.   No current facility-administered medications on file prior to visit.    Allergies:  Allergies  Allergen Reactions   Penicillins Anaphylaxis    Social History:  Social History   Socioeconomic History   Marital status: Single    Spouse name: Not on file   Number of children: Not on file   Years of education: Not on file   Highest education level: Bachelor's degree (e.g., BA, AB, BS)  Occupational History   Not on file  Tobacco Use   Smoking status: Every Day    Current packs/day: 0.50    Average packs/day: 0.7 packs/day for 66.0 years (44.0 ttl pk-yrs)    Types: Cigarettes    Passive exposure: Past   Smokeless tobacco: Never  Vaping Use   Vaping status: Never Used  Substance and Sexual Activity   Alcohol use: Yes     Alcohol/week: 4.0 standard drinks of alcohol  Types: 4 Cans of beer per week    Comment: only 3 a week not 30 but won't change   Drug use: Never   Sexual activity: Not Currently    Birth control/protection: None  Other Topics Concern   Not on file  Social History Narrative   Not on file   Social Drivers of Health   Financial Resource Strain: Low Risk  (02/23/2024)   Overall Financial Resource Strain (CARDIA)    Difficulty of Paying Living Expenses: Not hard at all  Food Insecurity: No Food Insecurity (02/23/2024)   Hunger Vital Sign    Worried About Running Out of Food in the Last Year: Never true    Ran Out of Food in the Last Year: Never true  Transportation Needs: No Transportation Needs (02/23/2024)   PRAPARE - Administrator, Civil Service (Medical): No    Lack of Transportation (Non-Medical): No  Physical Activity: Insufficiently Active (02/23/2024)   Exercise Vital Sign    Days of Exercise per Week: 1 day    Minutes of Exercise per Session: 10 min  Stress: No Stress Concern Present (02/23/2024)   [employer] of Occupational Health - Occupational Stress Questionnaire    Feeling of Stress: Not at all  Social Connections: Moderately Isolated (02/23/2024)   Social Connection and Isolation Panel    Frequency of Communication with Friends and Family: More than three times a week    Frequency of Social Gatherings with Friends and Family: More than three times a week    Attends Religious Services: More than 4 times per year    Active Member of [employer] or Organizations: No    Attends Engineer, structural: Not on file    Marital Status: Divorced  Intimate Partner Violence: Not At Risk (02/26/2024)   Humiliation, Afraid, Rape, and Kick questionnaire    Fear of Current or Ex-Partner: No    Emotionally Abused: No    Physically Abused: No    Sexually Abused: No   Social History   Tobacco Use  Smoking Status Every Day   Current packs/day: 0.50   Average  packs/day: 0.7 packs/day for 66.0 years (44.0 ttl pk-yrs)   Types: Cigarettes   Passive exposure: Past  Smokeless Tobacco Never   Social History   Substance and Sexual Activity  Alcohol Use Yes   Alcohol/week: 4.0 standard drinks of alcohol   Types: 4 Cans of beer per week   Comment: only 3 a week not 30 but won't change    Family History:  Family History  Problem Relation Age of Onset   Heart attack Mother    Stroke Mother    Uterine cancer Mother    Colon cancer Mother    Cancer Mother    Obesity Mother    Heart attack Father    Hypertension Brother    Other Brother        Pacemaker   Emphysema Brother    COPD Brother    Diabetes Maternal Grandmother    Heart disease Maternal Grandmother    Heart disease Maternal Grandfather    Heart disease Paternal Grandmother    Heart disease Paternal Grandfather    Sleep apnea Neg Hx     Past medical history, surgical history, medications, allergies, family history and social history reviewed with patient today and changes made to appropriate areas of the chart.   ROS All other ROS negative except what is listed above and in the HPI.  Objective:    BP 124/70 (BP Location: Left Arm, Patient Position: Sitting, Cuff Size: Normal)   Pulse 67   Temp 98.2 F (36.8 C) (Oral)   Ht 5' 4.5 (1.638 m)   Wt 162 lb 6.4 oz (73.7 kg)   LMP 09/07/2010 (Approximate)   SpO2 97%   BMI 27.45 kg/m   Wt Readings from Last 3 Encounters:  02/26/24 162 lb 6.4 oz (73.7 kg)  01/28/24 171 lb (77.6 kg)  07/15/23 164 lb (74.4 kg)    Physical Exam Vitals and nursing note reviewed. Exam conducted with a chaperone present.  Constitutional:      General: She is awake. She is not in acute distress.    Appearance: She is well-developed and well-groomed. She is not ill-appearing or toxic-appearing.  HENT:     Head: Normocephalic and atraumatic.     Right Ear: Hearing, tympanic membrane, ear canal and external ear normal. No drainage.     Left  Ear: Hearing, tympanic membrane, ear canal and external ear normal. No drainage.     Nose: Nose normal.     Right Sinus: No maxillary sinus tenderness or frontal sinus tenderness.     Left Sinus: No maxillary sinus tenderness or frontal sinus tenderness.     Mouth/Throat:     Mouth: Mucous membranes are moist.     Pharynx: Oropharynx is clear. Uvula midline. No pharyngeal swelling, oropharyngeal exudate or posterior oropharyngeal erythema.  Eyes:     General: Lids are normal.        Right eye: No discharge.        Left eye: No discharge.     Extraocular Movements: Extraocular movements intact.     Conjunctiva/sclera: Conjunctivae normal.     Pupils: Pupils are equal, round, and reactive to light.     Visual Fields: Right eye visual fields normal and left eye visual fields normal.  Neck:     Thyroid: No thyromegaly.     Vascular: Carotid bruit (mild bilateral) present.     Trachea: Trachea normal.  Cardiovascular:     Rate and Rhythm: Normal rate and regular rhythm.     Heart sounds: Normal heart sounds. No murmur heard.    No gallop.  Pulmonary:     Effort: Pulmonary effort is normal. No accessory muscle usage or respiratory distress.     Breath sounds: Normal breath sounds.  Chest:  Breasts:    Right: Normal.     Left: Normal.  Abdominal:     General: Bowel sounds are normal.     Palpations: Abdomen is soft. There is no hepatomegaly or splenomegaly.     Tenderness: There is no abdominal tenderness.  Musculoskeletal:        General: Normal range of motion.     Cervical back: Normal range of motion and neck supple.     Right lower leg: No edema.     Left lower leg: No edema.  Lymphadenopathy:     Head:     Right side of head: No submental, submandibular, tonsillar, preauricular or posterior auricular adenopathy.     Left side of head: No submental, submandibular, tonsillar, preauricular or posterior auricular adenopathy.     Cervical: No cervical adenopathy.     Upper Body:      Right upper body: No supraclavicular, axillary or pectoral adenopathy.     Left upper body: No supraclavicular, axillary or pectoral adenopathy.  Skin:    General: Skin is warm and dry.  Capillary Refill: Capillary refill takes less than 2 seconds.     Findings: No rash.  Neurological:     Mental Status: She is alert and oriented to person, place, and time.     Gait: Gait is intact.     Deep Tendon Reflexes: Reflexes are normal and symmetric.     Reflex Scores:      Brachioradialis reflexes are 2+ on the right side and 2+ on the left side.      Patellar reflexes are 2+ on the right side and 2+ on the left side. Psychiatric:        Attention and Perception: Attention normal.        Mood and Affect: Mood normal.        Speech: Speech normal.        Behavior: Behavior normal. Behavior is cooperative.        Thought Content: Thought content normal.        Judgment: Judgment normal.    Results for orders placed or performed in visit on 01/27/24  VAS US  ABI WITH/WO TBI   Collection Time: 01/27/24  3:26 PM  Result Value Ref Range   Right ABI 1.10    Left ABI 1.05       Assessment & Plan:   Problem List Items Addressed This Visit       Cardiovascular and Mediastinum   Aortic atherosclerosis (HCC)   Chronic.  Noted on lung CT screening.  Continue statin and ASA daily for prevention purposes.  Recommend complete cessation of smoking.      Relevant Medications   atorvastatin  (LIPITOR) 20 MG tablet   Other Relevant Orders   Comprehensive metabolic panel with GFR   TSH   Lipid Panel w/o Chol/HDL Ratio   Urinalysis, Routine w reflex microscopic     Respiratory   Centrilobular emphysema (HCC) - Primary   Chronic, ongoing, noted on screening.  Recommend complete cessation of smoking. Continue Anoro daily and Albuterol  PRN.  Continue annual lung screening.  Zpack sent for her upcoming travels [place] to use as needed if illness presents. Spirometry next visit.       Relevant Medications   azithromycin  (ZITHROMAX ) 250 MG tablet   albuterol  (VENTOLIN  HFA) 108 (90 Base) MCG/ACT inhaler   umeclidinium-vilanterol (ANORO ELLIPTA ) 62.5-25 MCG/ACT AEPB   Other Relevant Orders   CBC with Differential/Platelet   TSH     Other   Hyperlipemia   Ongoing and stable.  Continue Atorvastatin  10 MG daily.  Check lipid panel and CMP today.  Recommend diet changes and modest weight loss.       Relevant Medications   atorvastatin  (LIPITOR) 20 MG tablet   Other Relevant Orders   Comprehensive metabolic panel with GFR   TSH   Lipid Panel w/o Chol/HDL Ratio   Cigarette nicotine  dependence without complication   I have recommended complete cessation of tobacco use. I have discussed various options available for assistance with tobacco cessation including over the counter methods (Nicotine  gum, patch and lozenges). We also discussed prescription options (Chantix , Nicotine  Inhaler / Nasal Spray). The patient is not interested in pursuing any prescription tobacco cessation options at this time.       Bilateral carotid bruits   <50% stenosis on imaging.  Follow-up with vascular PRN or is symptomatic, plan to recheck imaging in 5-6 years.  Recommend complete cessation of smoking.      B12 deficiency   Chronic, taking supplement via injections with [place] weight loss.  Check level today.      Relevant Orders   CBC with Differential/Platelet   Vitamin B12   Other Visit Diagnoses       Postmenopausal estrogen deficiency       DEXA ordered and she is aware to schedule.   Relevant Orders   DG Bone Density     Need for pneumococcal 20-valent conjugate vaccination       PCV20 in office today, educated patient.   Relevant Orders   Pneumococcal conjugate vaccine 20-valent (Prevnar 20) (Completed)     Colon cancer screening       GI referral placed   Relevant Orders   Ambulatory referral to Gastroenterology     Encounter for annual physical exam       Annual  physical today with labs and health maintenance reviewed, discussed with patient.        Follow up plan: Return in about 3 months (around 05/28/2024) for WELCOME TO MEDICARE VISIT.   LABORATORY TESTING:  - Pap smear: up to date  IMMUNIZATIONS:   - Tdap: Tetanus vaccination status reviewed: last tetanus booster within 10 years. - Influenza: Up to date - Pneumovax: Up to date - Prevnar: Provided today - COVID: Up to date - HPV: Not applicable - Shingrix  vaccine: Up to date  SCREENING: -Mammogram: Up to date  - Colonoscopy: Ordered today - Bone Density: Ordered today -Hearing Test: Not applicable  -Spirometry: Up To Date  PATIENT COUNSELING:   Advised to take 1 mg of folate supplement per day if capable of pregnancy.   Sexuality: Discussed sexually transmitted diseases, partner selection, use of condoms, avoidance of unintended pregnancy  and contraceptive alternatives.   Advised to avoid cigarette smoking.  I discussed with the patient that most people either abstain from alcohol or drink within safe limits (<=14/week and <=4 drinks/occasion for males, <=7/weeks and <= 3 drinks/occasion for females) and that the risk for alcohol disorders and other health effects rises proportionally with the number of drinks per week and how often a drinker exceeds daily limits.  Discussed cessation/primary prevention of drug use and availability of treatment for abuse.   Diet: Encouraged to adjust caloric intake to maintain  or achieve ideal body weight, to reduce intake of dietary saturated fat and total fat, to limit sodium intake by avoiding high sodium foods and not adding table salt, and to maintain adequate dietary potassium and calcium  preferably from fresh fruits, vegetables, and low-fat dairy products.    Stressed the importance of regular exercise  Injury prevention: Discussed safety belts, safety helmets, smoke detector, smoking near bedding or upholstery.   Dental health:  Discussed importance of regular tooth brushing, flossing, and dental visits.    NEXT PREVENTATIVE PHYSICAL DUE IN 1 YEAR. Return in about 3 months (around 05/28/2024) for WELCOME TO MEDICARE VISIT.

## 2024-02-26 NOTE — Assessment & Plan Note (Signed)
<  50% stenosis on imaging.  Follow-up with vascular PRN or is symptomatic, plan to recheck imaging in 5-6 years.  Recommend complete cessation of smoking. 

## 2024-02-26 NOTE — Assessment & Plan Note (Addendum)
 Chronic, ongoing, noted on screening.  Recommend complete cessation of smoking. Continue Anoro daily and Albuterol  PRN.  Continue annual lung screening.  Zpack sent for her upcoming travels [place] to use as needed if illness presents. Spirometry next visit.

## 2024-02-26 NOTE — Assessment & Plan Note (Signed)
 I have recommended complete cessation of tobacco use. I have discussed various options available for assistance with tobacco cessation including over the counter methods (Nicotine gum, patch and lozenges). We also discussed prescription options (Chantix, Nicotine Inhaler / Nasal Spray). The patient is not interested in pursuing any prescription tobacco cessation options at this time.

## 2024-02-27 ENCOUNTER — Ambulatory Visit (INDEPENDENT_AMBULATORY_CARE_PROVIDER_SITE_OTHER): Admitting: Vascular Surgery

## 2024-02-27 LAB — CBC WITH DIFFERENTIAL/PLATELET
Basophils Absolute: 0 x10E3/uL (ref 0.0–0.2)
Basos: 0 %
EOS (ABSOLUTE): 0.2 x10E3/uL (ref 0.0–0.4)
Eos: 3 %
Hematocrit: 38.1 % (ref 34.0–46.6)
Hemoglobin: 12.4 g/dL (ref 11.1–15.9)
Immature Grans (Abs): 0 x10E3/uL (ref 0.0–0.1)
Immature Granulocytes: 0 %
Lymphocytes Absolute: 2.9 x10E3/uL (ref 0.7–3.1)
Lymphs: 47 %
MCH: 31.4 pg (ref 26.6–33.0)
MCHC: 32.5 g/dL (ref 31.5–35.7)
MCV: 97 fL (ref 79–97)
Monocytes Absolute: 0.4 x10E3/uL (ref 0.1–0.9)
Monocytes: 7 %
Neutrophils Absolute: 2.6 x10E3/uL (ref 1.4–7.0)
Neutrophils: 43 %
Platelets: 191 x10E3/uL (ref 150–450)
RBC: 3.95 x10E6/uL (ref 3.77–5.28)
RDW: 12 % (ref 11.7–15.4)
WBC: 6.2 x10E3/uL (ref 3.4–10.8)

## 2024-02-27 LAB — COMPREHENSIVE METABOLIC PANEL WITH GFR
ALT: 9 IU/L (ref 0–32)
AST: 13 IU/L (ref 0–40)
Albumin: 4.5 g/dL (ref 3.9–4.9)
Alkaline Phosphatase: 100 IU/L (ref 44–121)
BUN/Creatinine Ratio: 14 (ref 12–28)
BUN: 15 mg/dL (ref 8–27)
Bilirubin Total: 0.5 mg/dL (ref 0.0–1.2)
CO2: 23 mmol/L (ref 20–29)
Calcium: 9.3 mg/dL (ref 8.7–10.3)
Chloride: 104 mmol/L (ref 96–106)
Creatinine, Ser: 1.04 mg/dL — ABNORMAL HIGH (ref 0.57–1.00)
Globulin, Total: 1.8 g/dL (ref 1.5–4.5)
Glucose: 92 mg/dL (ref 70–99)
Potassium: 4.2 mmol/L (ref 3.5–5.2)
Sodium: 141 mmol/L (ref 134–144)
Total Protein: 6.3 g/dL (ref 6.0–8.5)
eGFR: 60 mL/min/1.73 (ref >59)

## 2024-02-27 LAB — LIPID PANEL W/O CHOL/HDL RATIO
Cholesterol, Total: 153 mg/dL (ref 100–199)
HDL: 68 mg/dL (ref >39)
LDL Chol Calc (NIH): 71 mg/dL (ref 0–99)
Triglycerides: 75 mg/dL (ref 0–149)
VLDL Cholesterol Cal: 14 mg/dL (ref 5–40)

## 2024-02-27 LAB — TSH: TSH: 1.65 u[IU]/mL (ref 0.450–4.500)

## 2024-02-27 LAB — VITAMIN B12: Vitamin B-12: >2000 pg/mL — ABNORMAL HIGH (ref 232–1245)

## 2024-02-27 NOTE — Telephone Encounter (Signed)
 Requested Prescriptions  Refused Prescriptions Disp Refills   ANORO ELLIPTA  62.5-25 MCG/ACT AEPB [Pharmacy Med Name: ANORO ELLIPTA  62.5-25 MCG INH] 180 each 4    Sig: INHALE 1 PUFF BY MOUTH EVERY DAY     Pulmonology:  Combination Products Passed - 02/27/2024 12:59 PM      Passed - Valid encounter within last 12 months    Recent Outpatient Visits           Yesterday Centrilobular emphysema (HCC)   [place] [facility] [place], Melanie T, NP   3 months ago COPD exacerbation [facility])   [place] [facility] Melanie DASEN, NP       Future Appointments             In 2 months Hester Alm BROCKS, MD [facility]

## 2024-02-27 NOTE — Progress Notes (Signed)
 Contacted via MyChart  Good morning Aimee Lawson, your labs have returned and overall are stable.  Please continue all current medications.  Your B12 level is definitely elevated.  I am not sure how often they are providing you with shots, but you may want to cut back a little.  Any questions? Keep being amazing!!  Thank you for allowing me to participate in your care.  I appreciate you. Kindest regards, Jasalyn Frysinger

## 2024-02-29 LAB — URINE CULTURE

## 2024-02-29 NOTE — Progress Notes (Signed)
 Contacted via MyChart  Good evening Aimee Lawson, your urine culture returned and the antibiotic you are taking is susceptible to the bacteria growing.  Good news.  Complete full course.:)

## 2024-03-02 ENCOUNTER — Encounter: Payer: Self-pay | Admitting: Nurse Practitioner

## 2024-03-02 ENCOUNTER — Other Ambulatory Visit: Payer: Self-pay | Admitting: Nurse Practitioner

## 2024-03-02 MED ORDER — CIPROFLOXACIN HCL 500 MG PO TABS: 500.0000 mg | ORAL_TABLET | Freq: Every day | ORAL | 0 refills | Status: AC

## 2024-03-11 ENCOUNTER — Ambulatory Visit
Admission: RE | Admit: 2024-03-11 | Discharge: 2024-03-11 | Disposition: A | Discharge status: Home / Self Care | Source: Ambulatory Visit | Attending: Nurse Practitioner | Admitting: Nurse Practitioner

## 2024-03-11 DIAGNOSIS — Z1231 Encounter for screening mammogram for malignant neoplasm of breast: Secondary | ICD-10-CM | POA: Insufficient documentation

## 2024-03-12 ENCOUNTER — Ambulatory Visit
Admission: RE | Admit: 2024-03-12 | Discharge: 2024-03-12 | Disposition: A | Discharge status: Home / Self Care | Source: Ambulatory Visit | Attending: Acute Care | Admitting: Acute Care

## 2024-03-12 DIAGNOSIS — Z87891 Personal history of nicotine dependence: Secondary | ICD-10-CM

## 2024-03-12 DIAGNOSIS — Z122 Encounter for screening for malignant neoplasm of respiratory organs: Secondary | ICD-10-CM

## 2024-03-16 ENCOUNTER — Ambulatory Visit: Payer: Self-pay | Admitting: Nurse Practitioner

## 2024-03-16 NOTE — Progress Notes (Signed)
 Contacted via MyChart   Normal mammogram, may repeat in one year:)

## 2024-03-26 ENCOUNTER — Other Ambulatory Visit: Payer: Self-pay | Admitting: Acute Care

## 2024-03-26 ENCOUNTER — Ambulatory Visit (INDEPENDENT_AMBULATORY_CARE_PROVIDER_SITE_OTHER): Admitting: Vascular Surgery

## 2024-03-26 DIAGNOSIS — Z122 Encounter for screening for malignant neoplasm of respiratory organs: Secondary | ICD-10-CM

## 2024-03-26 DIAGNOSIS — Z87891 Personal history of nicotine dependence: Secondary | ICD-10-CM

## 2024-04-24 ENCOUNTER — Telehealth (INDEPENDENT_AMBULATORY_CARE_PROVIDER_SITE_OTHER): Payer: Self-pay | Admitting: Vascular Surgery

## 2024-04-24 NOTE — Telephone Encounter (Signed)
 LVM for pt in response to her VM stating that she has to cancel her sclerotherapy again. I advised a call back to reschedule.  left leg FOAM sclero. see gs. no prior shara champagne- decision #[id number] - 2 units

## 2024-04-27 ENCOUNTER — Ambulatory Visit: Admitting: Dermatology

## 2024-04-27 ENCOUNTER — Encounter: Payer: Self-pay | Admitting: Dermatology

## 2024-04-27 DIAGNOSIS — D485 Neoplasm of uncertain behavior of skin: Secondary | ICD-10-CM | POA: Diagnosis not present

## 2024-04-27 DIAGNOSIS — Z79899 Other long term (current) drug therapy: Secondary | ICD-10-CM

## 2024-04-27 DIAGNOSIS — D0472 Carcinoma in situ of skin of left lower limb, including hip: Secondary | ICD-10-CM | POA: Diagnosis not present

## 2024-04-27 DIAGNOSIS — C44729 Squamous cell carcinoma of skin of left lower limb, including hip: Secondary | ICD-10-CM | POA: Diagnosis not present

## 2024-04-27 DIAGNOSIS — Z7189 Other specified counseling: Secondary | ICD-10-CM

## 2024-04-27 DIAGNOSIS — L570 Actinic keratosis: Secondary | ICD-10-CM

## 2024-04-27 DIAGNOSIS — C4492 Squamous cell carcinoma of skin, unspecified: Secondary | ICD-10-CM

## 2024-04-27 DIAGNOSIS — L820 Inflamed seborrheic keratosis: Secondary | ICD-10-CM | POA: Diagnosis not present

## 2024-04-27 DIAGNOSIS — L578 Other skin changes due to chronic exposure to nonionizing radiation: Secondary | ICD-10-CM

## 2024-04-27 DIAGNOSIS — Z5111 Encounter for antineoplastic chemotherapy: Secondary | ICD-10-CM

## 2024-04-27 DIAGNOSIS — W908XXA Exposure to other nonionizing radiation, initial encounter: Secondary | ICD-10-CM

## 2024-04-27 HISTORY — DX: Squamous cell carcinoma of skin, unspecified: C44.92

## 2024-04-27 NOTE — Progress Notes (Unsigned)
 Follow-Up Visit   Subjective  Aimee Lawson is a 63 y.o. female who presents for the following: Ak f/u, pt hasn't been able to use 5FU yet due to schedule. He has a skin growth on each leg, one that has been frozen several times, and one that is new and she would like treated with LN2.  The following portions of the chart were reviewed this encounter and updated as appropriate: medications, allergies, medical history  Review of Systems:  No other skin or systemic complaints except as noted in HPI or Assessment and Plan.  Objective  Well appearing patient in no apparent distress; mood and affect are within normal limits.  A focused examination was performed of the following areas: the face, arms, and legs  Relevant exam findings are noted in the Assessment and Plan.  L pretibial 0.8 cm hyperkeratotic papule.  L lat pretibial 1.5 cm hyperkeratotic papule. R shoulder x 3, R lat neck at prox mandible x 1, R lat calf x 1 (5) Erythematous stuck-on, waxy papule or plaque Head - Anterior (Face) (15) Erythematous thin papules/macules with gritty scale.   Assessment & Plan   ACTINIC DAMAGE WITH PRECANCEROUS ACTINIC KERATOSES Counseling for Topical Chemotherapy Management: Patient exhibits: - Severe, confluent actinic changes with pre-cancerous actinic keratoses that is secondary to cumulative UV radiation exposure over time - Condition that is severe; chronic, not at goal. - diffuse scaly erythematous macules and papules with underlying dyspigmentation - Discussed Prescription Field Treatment topical Chemotherapy for Severe, Chronic Confluent Actinic Changes with Pre-Cancerous Actinic Keratoses Field treatment involves treatment of an entire area of skin that has confluent Actinic Changes (Sun/ Ultraviolet light damage) and PreCancerous Actinic Keratoses by method of PhotoDynamic Therapy (PDT) and/or prescription Topical Chemotherapy agents such as 5-fluorouracil ,  5-fluorouracil /calcipotriene , and/or imiquimod.  The purpose is to decrease the number of clinically evident and subclinical PreCancerous lesions to prevent progression to development of skin cancer by chemically destroying early precancer changes that may or may not be visible.  It has been shown to reduce the risk of developing skin cancer in the treated area. As a result of treatment, redness, scaling, crusting, and open sores may occur during treatment course. One or more than one of these methods may be used and may have to be used several times to control, suppress and eliminate the PreCancerous changes. Discussed treatment course, expected reaction, and possible side effects. - Recommend daily broad spectrum sunscreen SPF 30+ to sun-exposed areas, reapply every 2 hours as needed.  - Staying in the shade or wearing long sleeves, sun glasses (UVA+UVB protection) and wide brim hats (4-inch brim around the entire circumference of the hat) are also recommended. - Call for new or changing lesions. - Starting December 14th apply 5-fluorouracil /calcipotriene  cream twice a day for up to 7 days to affected areas including forehead and temples. 3 weeks later treat cheeks twice a day for up to 7 days and 3 weeks after that treat nose, upper lip and chin twice a day for up to 7 days. In January apply cream twice daily for up to 7 days at chest. Pt declines rx being sent to [facility], prescription sent to pharmacy on 07/092025.   Reviewed course of treatment and expected reaction.  Patient advised to expect inflammation and crusting and advised that erosions are possible.  Patient advised to be diligent with sun protection during and after treatment. Counseled to keep medication out of reach of children and pets.   NEOPLASM OF UNCERTAIN  BEHAVIOR OF SKIN (2) L pretibial Epidermal / dermal shaving  Lesion diameter (cm):  0.8 Informed consent: discussed and consent obtained   Timeout: patient name, date of  birth, surgical site, and procedure verified   Procedure prep:  Patient was prepped and draped in usual sterile fashion Prep type:  Isopropyl alcohol Anesthesia: the lesion was anesthetized in a standard fashion   Anesthetic:  1% lidocaine  w/ epinephrine  1-100,000 buffered w/ 8.4% NaHCO3 Instrument used: flexible razor blade   Hemostasis achieved with: pressure, aluminum chloride and electrodesiccation   Outcome: patient tolerated procedure well   Post-procedure details: sterile dressing applied and wound care instructions given   Dressing type: bandage (Mupirocin 2% ointment)    Destruction of lesion Complexity: extensive   Destruction method: electrodesiccation and curettage   Informed consent: discussed and consent obtained   Timeout:  patient name, date of birth, surgical site, and procedure verified Procedure prep:  Patient was prepped and draped in usual sterile fashion Prep type:  Isopropyl alcohol Anesthesia: the lesion was anesthetized in a standard fashion   Anesthetic:  1% lidocaine  w/ epinephrine  1-100,000 buffered w/ 8.4% NaHCO3 Curettage performed in three different directions: Yes   Electrodesiccation performed over the curetted area: Yes   Lesion length (cm):  0.8 Lesion width (cm):  0.8 Margin per side (cm):  0.2 Final wound size (cm):  1.2 Hemostasis achieved with:  pressure, aluminum chloride and electrodesiccation Outcome: patient tolerated procedure well with no complications   Post-procedure details: sterile dressing applied and wound care instructions given   Dressing type: bandage and petrolatum    Specimen 1 - Surgical pathology Differential Diagnosis: D48.5 r/o SCC ED&C today Check Margins: No L lat pretibial Epidermal / dermal shaving  Lesion diameter (cm):  1.5 Informed consent: discussed and consent obtained   Timeout: patient name, date of birth, surgical site, and procedure verified   Procedure prep:  Patient was prepped and draped in usual  sterile fashion Prep type:  Isopropyl alcohol Anesthesia: the lesion was anesthetized in a standard fashion   Anesthetic:  1% lidocaine  w/ epinephrine  1-100,000 buffered w/ 8.4% NaHCO3 Instrument used: flexible razor blade   Hemostasis achieved with: pressure, aluminum chloride and electrodesiccation   Outcome: patient tolerated procedure well   Post-procedure details: sterile dressing applied and wound care instructions given   Dressing type: bandage (Mupirocin 2% ointment)    Destruction of lesion Complexity: extensive   Destruction method: electrodesiccation and curettage   Informed consent: discussed and consent obtained   Timeout:  patient name, date of birth, surgical site, and procedure verified Procedure prep:  Patient was prepped and draped in usual sterile fashion Prep type:  Isopropyl alcohol Anesthesia: the lesion was anesthetized in a standard fashion   Anesthetic:  1% lidocaine  w/ epinephrine  1-100,000 buffered w/ 8.4% NaHCO3 Curettage performed in three different directions: Yes   Electrodesiccation performed over the curetted area: Yes   Lesion length (cm):  1.5 Lesion width (cm):  1.5 Margin per side (cm):  0.2 Final wound size (cm):  1.9 Hemostasis achieved with:  pressure, aluminum chloride and electrodesiccation Outcome: patient tolerated procedure well with no complications   Post-procedure details: sterile dressing applied and wound care instructions given   Dressing type: bandage and petrolatum    Specimen 2 - Surgical pathology Differential Diagnosis: D48.5 r/o SCC ED&C today Check Margins: No INFLAMED SEBORRHEIC KERATOSIS (5) R shoulder x 3, R lat neck at prox mandible x 1, R lat calf x 1 (5) Symptomatic, irritating, patient  would like treated.  Destruction of lesion - R shoulder x 3, R lat neck at prox mandible x 1, R lat calf x 1 (5) Complexity: simple   Destruction method: cryotherapy   Informed consent: discussed and consent obtained   Timeout:   patient name, date of birth, surgical site, and procedure verified Lesion destroyed using liquid nitrogen: Yes   Region frozen until ice ball extended beyond lesion: Yes   Outcome: patient tolerated procedure well with no complications   Post-procedure details: wound care instructions given    AK (ACTINIC KERATOSIS) (15) Head - Anterior (Face) (15) Actinic keratoses are precancerous spots that appear secondary to cumulative UV radiation exposure/sun exposure over time. They are chronic with expected duration over 1 year. A portion of actinic keratoses will progress to squamous cell carcinoma of the skin. It is not possible to reliably predict which spots will progress to skin cancer and so treatment is recommended to prevent development of skin cancer.  Recommend daily broad spectrum sunscreen SPF 30+ to sun-exposed areas, reapply every 2 hours as needed.  Recommend staying in the shade or wearing long sleeves, sun glasses (UVA+UVB protection) and wide brim hats (4-inch brim around the entire circumference of the hat). Call for new or changing lesions. At  Destruction of lesion - Head - Anterior (Face) (15) Complexity: simple   Destruction method: cryotherapy   Informed consent: discussed and consent obtained   Timeout:  patient name, date of birth, surgical site, and procedure verified Lesion destroyed using liquid nitrogen: Yes   Region frozen until ice ball extended beyond lesion: Yes   Outcome: patient tolerated procedure well with no complications   Post-procedure details: wound care instructions given    CHEMOTHERAPY MANAGEMENT, ENCOUNTER FOR   COUNSELING AND COORDINATION OF CARE   MEDICATION MANAGEMENT   ACTINIC SKIN DAMAGE    SEBORRHEIC KERATOSIS - Stuck-on, waxy, tan-brown papules and/or plaques  - Benign-appearing - Discussed benign etiology and prognosis. - Observe - Call for any changes  Return in about 6 months (around 10/26/2024) for Ak follow up.  LILLETTE Rosina Mayans, CMA, am acting as scribe for Alm Rhyme, MD .  Documentation: I have reviewed the above documentation for accuracy and completeness, and I agree with the above.  Alm Rhyme, MD

## 2024-04-27 NOTE — Patient Instructions (Signed)
 Electrodesiccation and Curettage ("Scrape and Burn") Wound Care Instructions  Leave the original bandage on for 24 hours if possible.  If the bandage becomes soaked or soiled before that time, it is OK to remove it and examine the wound.  A small amount of post-operative bleeding is normal.  If excessive bleeding occurs, remove the bandage, place gauze over the site and apply continuous pressure (no peeking) over the area for 30 minutes. If this does not work, please call our clinic as soon as possible or page your doctor if it is after hours.   Once a day, cleanse the wound with soap and water. It is fine to shower. If a thick crust develops you may use a Q-tip dipped into dilute hydrogen peroxide (mix 1:1 with water) to dissolve it.  Hydrogen peroxide can slow the healing process, so use it only as needed.    After washing, apply petroleum jelly (Vaseline) or an antibiotic ointment if your doctor prescribed one for you, followed by a bandage.    For best healing, the wound should be covered with a layer of ointment at all times. If you are not able to keep the area covered with a bandage to hold the ointment in place, this may mean re-applying the ointment several times a day.  Continue this wound care until the wound has healed and is no longer open. It may take several weeks for the wound to heal and close.  Itching and mild discomfort is normal during the healing process.  If you have any discomfort, you can take Tylenol  (acetaminophen ) or ibuprofen as directed on the bottle. (Please do not take these if you have an allergy to them or cannot take them for another reason).  Some redness, tenderness and white or yellow material in the wound is normal healing.  If the area becomes very sore and red, or develops a thick yellow-green material (pus), it may be infected; please notify us .    Wound healing continues for up to one year following surgery. It is not unusual to experience pain in the scar  from time to time during the interval.  If the pain becomes severe or the scar thickens, you should notify the office.    A slight amount of redness in a scar is expected for the first six months.  After six months, the redness will fade and the scar will soften and fade.  The color difference becomes less noticeable with time.  If there are any problems, return for a post-op surgery check at your earliest convenience.  To improve the appearance of the scar, you can use silicone scar gel, cream, or sheets (such as Mederma or Serica) every night for up to one year. These are available over the counter (without a prescription).  Please call our office at [phone] for any questions or concerns.  Due to recent changes in healthcare laws, you may see results of your pathology and/or laboratory studies on MyChart before the doctors have had a chance to review them. We understand that in some cases there may be results that are confusing or concerning to you. Please understand that not all results are received at the same time and often the doctors may need to interpret multiple results in order to provide you with the best plan of care or course of treatment. Therefore, we ask that you please give us  2 business days to thoroughly review all your results before contacting the office for clarification. Should we see a  critical lab result, you will be contacted sooner.   If You Need Anything After Your Visit  If you have any questions or concerns for your doctor, please call our main line at [phone] and press option 4 to reach your doctor's medical assistant. If no one answers, please leave a voicemail as directed and we will return your call as soon as possible. Messages left after 4 pm will be answered the following business day.   You may also send us  a message via MyChart. We typically respond to MyChart messages within 1-2 business days.  For prescription refills, please ask your pharmacy to  contact our office. Our fax number is [phone].  If you have an urgent issue when the clinic is closed that cannot wait until the next business day, you can page your doctor at the number below.    Please note that while we do our best to be available for urgent issues outside of office hours, we are not available 24/7.   If you have an urgent issue and are unable to reach us , you may choose to seek medical care at your doctor's office, retail clinic, urgent care center, or emergency room.  If you have a medical emergency, please immediately call 911 or go to the emergency department.  Pager Numbers  - Dr. Hester: [phone]  - Dr. Jackquline: [phone]  - Dr. Claudene: [phone]   - Dr. Raymund: [phone]  In the event of inclement weather, please call our main line at [phone] for an update on the status of any delays or closures.  Dermatology Medication Tips: Please keep the boxes that topical medications come in in order to help keep track of the instructions about where and how to use these. Pharmacies typically print the medication instructions only on the boxes and not directly on the medication tubes.   If your medication is too expensive, please contact our office at [phone] option 4 or send us  a message through MyChart.   We are unable to tell what your co-pay for medications will be in advance as this is different depending on your insurance coverage. However, we may be able to find a substitute medication at lower cost or fill out paperwork to get insurance to cover a needed medication.   If a prior authorization is required to get your medication covered by your insurance company, please allow us  1-2 business days to complete this process.  Drug prices often vary depending on where the prescription is filled and some pharmacies may offer cheaper prices.  The website www.goodrx.com contains coupons for medications through different pharmacies. The prices  here do not account for what the cost may be with help from insurance (it may be cheaper with your insurance), but the website can give you the price if you did not use any insurance.  - You can print the associated coupon and take it with your prescription to the pharmacy.  - You may also stop by our office during regular business hours and pick up a GoodRx coupon card.  - If you need your prescription sent electronically to a different pharmacy, notify our office through [facility] or by phone at [phone] option 4.     Si Usted Necesita Algo Despus de Su Visita  Tambin puede enviarnos un mensaje a travs de Clinical cytogeneticist. Por lo general respondemos a los mensajes de MyChart en el transcurso de 1 a 2 das hbiles.  Para renovar recetas, por favor pida a su farmacia que  se ponga en contacto con nuestra oficina. Randi lakes de fax es [place] [phone].  Si tiene un asunto urgente cuando la clnica est cerrada y que no puede esperar hasta el siguiente da hbil, puede llamar/localizar a su doctor(a) al nmero que aparece a continuacin.   Por favor, tenga en cuenta que aunque hacemos todo lo posible para estar disponibles para asuntos urgentes fuera del horario de [place], no estamos disponibles las 24 horas del da, los 7 [address]  [address] de la [place].   Si tiene un problema urgente y no puede comunicarse con nosotros, puede optar por buscar atencin mdica  en el consultorio de su doctor(a), en una clnica privada, en un centro de atencin urgente o en una sala de emergencias.  Si tiene Engineer, drilling, por favor llame inmediatamente al 911 o vaya a la sala de emergencias.  Nmeros de bper  - Dr. Hester: [phone]  - Dra. Jackquline: [phone]  - Dr. Claudene: [phone]  - Dra. Kitts: [phone]  En caso de inclemencias del [place], por favor llame a nuestra lnea principal al [phone] para una actualizacin sobre el estado de cualquier retraso o cierre.  Consejos  para la medicacin en dermatologa: Por favor, guarde las cajas en las que vienen los medicamentos de uso tpico para ayudarle a seguir las instrucciones sobre dnde y cmo usarlos. Las farmacias generalmente imprimen las instrucciones del medicamento slo en las cajas y no directamente en los tubos del [place].   Si su medicamento es muy caro, por favor, pngase en contacto con landry rieger llamando al [phone] y presione la opcin 4 o envenos un mensaje a travs de Clinical cytogeneticist.   No podemos decirle cul ser su copago por los medicamentos por adelantado ya que esto es diferente dependiendo de la cobertura de su seguro. Sin embargo, es posible que podamos encontrar un medicamento sustituto a Audiological scientist un formulario para que el seguro cubra el medicamento que se considera necesario.   Si se requiere una autorizacin previa para que su compaa de seguros malta su medicamento, por favor permtanos de 1 a 2 das hbiles para completar este proceso.  Los precios de los medicamentos varan con frecuencia dependiendo del Environmental consultant de dnde se surte la receta y alguna farmacias pueden ofrecer precios ms baratos.  El sitio web www.goodrx.com tiene cupones para medicamentos de Health and safety inspector. Los precios aqu no tienen en cuenta lo que podra costar con la ayuda del seguro (puede ser ms barato con su seguro), pero el sitio web puede darle el precio si no utiliz Tourist information centre manager.  - Puede imprimir el cupn correspondiente y llevarlo con su receta a la farmacia.  - Tambin puede pasar por nuestra oficina durante el horario de atencin regular y Education officer, museum una tarjeta de cupones de GoodRx.  - Si necesita que su receta se enve electrnicamente a una farmacia diferente, informe a nuestra oficina a travs de MyChart de [place] o por telfono llamando al [phone] y presione la opcin 4.

## 2024-04-30 ENCOUNTER — Ambulatory Visit (INDEPENDENT_AMBULATORY_CARE_PROVIDER_SITE_OTHER): Admitting: Vascular Surgery

## 2024-04-30 ENCOUNTER — Encounter (INDEPENDENT_AMBULATORY_CARE_PROVIDER_SITE_OTHER): Payer: Self-pay

## 2024-04-30 LAB — SURGICAL PATHOLOGY

## 2024-05-01 ENCOUNTER — Ambulatory Visit: Payer: Self-pay | Admitting: Dermatology

## 2024-05-04 ENCOUNTER — Encounter: Payer: Self-pay | Admitting: Nurse Practitioner

## 2024-05-04 ENCOUNTER — Encounter: Payer: Self-pay | Admitting: Dermatology

## 2024-05-04 NOTE — Telephone Encounter (Signed)
 Scheduled

## 2024-05-04 NOTE — Telephone Encounter (Addendum)
 Called and discussed results with patient. She verbalized understanding and denied further questions Will recheck at follow up  ----- Message from Alm Rhyme sent at 05/01/2024  4:35 PM EDT ----- FINAL DIAGNOSIS        1. Skin, left pretibial :       SQUAMOUS CELL CARCINOMA IN SITU, BASE INVOLVED        2. Skin, left lat pretibial :       WELL DIFFERENTIATED SQUAMOUS CELL CARCINOMA   1&2 - both Cancer = SCC Both already treated Recheck next visit ----- Message ----- From: Interface, Lab In [place] Sent: 04/30/2024   4:32 PM EDT To: Alm JAYSON Rhyme, MD

## 2024-05-04 NOTE — Patient Instructions (Signed)
 COPD Exacerbation Learn about triggers that can make COPD worse and how to avoid them. To view the content, go to this web address: [email or web address]  This video will expire on: 12/02/2025. If you need access to this video following this date, please reach out to the healthcare provider who assigned it to you. This information is not intended to replace advice given to you by your health care provider. Make sure you discuss any questions you have with your health care provider. Elsevier Patient Education  [employer].

## 2024-05-05 ENCOUNTER — Encounter: Payer: Self-pay | Admitting: Nurse Practitioner

## 2024-05-05 ENCOUNTER — Telehealth (INDEPENDENT_AMBULATORY_CARE_PROVIDER_SITE_OTHER): Admitting: Nurse Practitioner

## 2024-05-05 DIAGNOSIS — J441 Chronic obstructive pulmonary disease with (acute) exacerbation: Secondary | ICD-10-CM | POA: Diagnosis not present

## 2024-05-05 MED ORDER — ALBUTEROL SULFATE (2.5 MG/3ML) 0.083% IN NEBU: 2.5000 mg | INHALATION_SOLUTION | Freq: Four times a day (QID) | RESPIRATORY_TRACT | 3 refills | Status: AC | PRN

## 2024-05-05 MED ORDER — HYDROCOD POLI-CHLORPHE POLI ER 10-8 MG/5ML PO SUER: 5.0000 mL | Freq: Every evening | ORAL | 0 refills | Status: DC | PRN

## 2024-05-05 MED ORDER — DOXYCYCLINE HYCLATE 100 MG PO TABS: 100.0000 mg | ORAL_TABLET | Freq: Two times a day (BID) | ORAL | 0 refills | Status: AC

## 2024-05-05 NOTE — Assessment & Plan Note (Addendum)
 Acute for a little over one week, tried Prednisone  at home with no benefit + took a leftover Zpack.  Symptoms ongoing.  Will send in Doxycycline 100 MG BID For 7 days, Tussionex as needed, and more Albuterol  nebs. Recommend complete cessation of smoking, she is cutting back.  Recommend: - Increased rest - Increasing Fluids - Acetaminophen  / ibuprofen  as needed for fever/pain.  - Salt water gargling, chloraseptic spray and throat lozenges - Mucinex.  - Saline sinus flushes or a neti pot.  - Humidifying the air.  - Strict ER precautions given.

## 2024-05-05 NOTE — Progress Notes (Signed)
 LMP 09/07/2010 (Approximate)    Subjective:    Patient ID: Aimee Lawson, female    DOB: 03-08-1961, 63 y.o.   MRN: [id number]  HPI: ATIYAH BAUER is a 63 y.o. female  Chief Complaint  Patient presents with   URI    Symptoms last Monday. Chest tightness, mucous, congestion she can't cough up, coughing, pink tinge to the phelgm this morning.    Virtual Visit via Video Note  I connected with Aimee Lawson on 05/05/24 at  4:20 PM EDT by a video enabled telemedicine application and verified that I am speaking with the correct person using two identifiers.  Location: Patient: home Provider: work   I discussed the limitations of evaluation and management by telemedicine and the availability of in person appointments. The patient expressed understanding and agreed to proceed.  I discussed the assessment and treatment plan with the patient. The patient was provided an opportunity to ask questions and all were answered. The patient agreed with the plan and demonstrated an understanding of the instructions.   The patient was advised to call back or seek an in-person evaluation if the symptoms worsen or if the condition fails to improve as anticipated.  I provided 25 minutes of non-face-to-face time during this encounter.   Mehlani Blankenburg T Zalyn Amend, NP   UPPER RESPIRATORY TRACT INFECTION Underlying COPD. Started with symptoms over a week ago. Using inhalers daily and nebulizer.  Continues to smoke, less than 1/2 PPD. Fever: no Cough: yes Shortness of breath: with coughing fits Wheezing: yes Chest pain: no Chest tightness: yes Chest congestion: yes Nasal congestion: no Runny nose: no Post nasal drip: no Sneezing: no Sore throat: no Swollen glands: no Sinus pressure: no Headache: yes with coughing a lot Face pain: no Toothache: no Ear pain: none Ear pressure: none Eyes red/itching:no Eye drainage/crusting: no  Vomiting: no Rash: no Fatigue: yes Sick contacts: yes  grandkids Strep contacts: no  Context: fluctuating Recurrent sinusitis: no Relief with OTC cold/cough medications: no  Treatments attempted: mucinex and cough syrup  , took some leftover Prednisone  40 MG last week (5 days total)  Relevant past medical, surgical, family and social history reviewed and updated as indicated. Interim medical history since our last visit reviewed. Allergies and medications reviewed and updated.  Review of Systems  Constitutional:  Positive for fatigue. Negative for activity change, appetite change, chills and fever.  HENT:  Positive for congestion, postnasal drip and rhinorrhea. Negative for ear discharge, ear pain, facial swelling, sinus pressure, sinus pain, sneezing, sore throat and voice change.   Respiratory:  Positive for cough, chest tightness and shortness of breath. Negative for wheezing.   Cardiovascular:  Negative for chest pain, palpitations and leg swelling.  Gastrointestinal: Negative.   Endocrine: Negative.   Neurological:  Positive for headaches. Negative for dizziness and numbness.  Psychiatric/Behavioral: Negative.      Per HPI unless specifically indicated above     Objective:    LMP 09/07/2010 (Approximate)   Wt Readings from Last 3 Encounters:  02/26/24 162 lb 6.4 oz (73.7 kg)  01/28/24 171 lb (77.6 kg)  07/15/23 164 lb (74.4 kg)    Physical Exam Vitals and nursing note reviewed.  Constitutional:      General: She is awake. She is not in acute distress.    Appearance: She is well-developed and well-groomed. She is ill-appearing. She is not toxic-appearing.  HENT:     Head: Normocephalic.     Right Ear: Hearing normal.  Left Ear: Hearing normal.  Eyes:     General: Lids are normal.        Right eye: No discharge.        Left eye: No discharge.     Conjunctiva/sclera: Conjunctivae normal.  Pulmonary:     Effort: Pulmonary effort is normal. No accessory muscle usage or respiratory distress.     Comments: Intermittent  dry cough noted. No SOB with talking. Musculoskeletal:     Cervical back: Normal range of motion.  Neurological:     Mental Status: She is alert and oriented to person, place, and time.  Psychiatric:        Attention and Perception: Attention normal.        Mood and Affect: Mood normal.        Behavior: Behavior normal. Behavior is cooperative.        Thought Content: Thought content normal.        Judgment: Judgment normal.     Results for orders placed or performed in visit on 04/27/24  Surgical pathology   Collection Time: 04/27/24 12:00 AM  Result Value Ref Range   SURGICAL PATHOLOGY      SURGICAL PATHOLOGY [facility] [address] [place], [place] [address] Telephone [phone] or [phone] Fax [phone]  REPORT OF DERMATOPATHOLOGY   Accession #: [id number] Patient Name: DECLYN, OFFIELD Visit # : [id number]  MRN: [id number] Cytotechnologist: Santa Md, Eleanor, Dermatopathologist, Electronic Signature DOB/Age [birth year] (Age: 69) Gender: F Collected Date: 04/27/2024 Received Date: 04/28/2024  FINAL DIAGNOSIS       1. Skin, left pretibial :       SQUAMOUS CELL CARCINOMA IN SITU, BASE INVOLVED       2. Skin, left lat pretibial :       WELL DIFFERENTIATED SQUAMOUS CELL CARCINOMA       ELECTRONIC SIGNATURE : Munoz-Bishop Md, Melissa, Dermatopathologist, Electronic Signature  MICROSCOPIC DESCRIPTION 1. There is a proliferation of atypical keratinocytes which show slow surface maturation and, in areas, there is full thickness epidermal atypia.The base of the biopsy is involved and an invasiv e component cannot be excluded. 2. There is a proliferation of atypical epithelial cells with squamous differentiation invading the dermis.This is a well differentiated squamous cell carcinoma.  CASE COMMENTS STAINS USED IN DIAGNOSIS: H&E H&E    CLINICAL HISTORY  SPECIMEN(S) OBTAINED 1. Skin, Left Pretibial 2.  Skin, Left Lat Pretibial  SPECIMEN COMMENTS: 1. 0.8 cm hyperkeratotic papule, ED&C today 2. 1.5 cm hyperkeratotic papule, ED&C today SPECIMEN CLINICAL INFORMATION: 1. Neoplasm of uncertain behavior of skin, R/O SCC 2. Neoplasm of uncertain behavior of skin, R/O SCC    Gross Description 1. Formalin fixed specimen received:  10 X 7 X 1 MM, TOTO (4 P) (1 B) ( mm ) 2. Formalin fixed specimen received:  15 X 13 X 3 MM, TOTO (5 P) (1 B) ( mm )        Report signed out from the following location(s) [place].  [facility] [address] [place], [place] [address] CLIA #: [id number]  [facility] [address] [place], [place] [address] CLIA #: [id number]       Assessment & Plan:   Problem List Items Addressed This Visit       Respiratory   COPD exacerbation (HCC) - Primary   Acute for a little over one week, tried Prednisone  at home with no benefit + took a leftover Zpack.  Symptoms  ongoing.  Will send in Doxycycline 100 MG BID For 7 days, Tussionex as needed, and more Albuterol  nebs. Recommend complete cessation of smoking, she is cutting back.  Recommend: - Increased rest - Increasing Fluids - Acetaminophen  / ibuprofen  as needed for fever/pain.  - Salt water gargling, chloraseptic spray and throat lozenges - Mucinex.  - Saline sinus flushes or a neti pot.  - Humidifying the air.  - Strict ER precautions given.      Relevant Medications   albuterol  (PROVENTIL ) (2.5 MG/3ML) 0.083% nebulizer solution   chlorpheniramine-HYDROcodone (TUSSIONEX) 10-8 MG/5ML     Follow up plan: Return for as scheduled 05/18/24.

## 2024-05-16 NOTE — Patient Instructions (Incomplete)
 Please call to schedule your mammogram and/or bone density: [facility] at [facility]  Address: [address], [place], [place] [address] Phone: [phone]  [facility] [address] [place],  [place]  [address] Phone: [phone]    Ms. Aimee Lawson,  Thank you for taking the time for your Medicare Wellness Visit. I appreciate your continued commitment to your health goals. Please review the care plan we discussed, and feel free to reach out if I can assist you further.  Please note that Annual Wellness Visits do not include a physical exam. Some assessments may be limited, especially if the visit was conducted virtually. If needed, we may recommend an in-person follow-up with your provider.  Ongoing Care Seeing your primary care provider every 3 to 6 months helps us  monitor your health and provide consistent, personalized care.   Referrals If a referral was made during today's visit and you haven't received any updates within two weeks, please contact the referred provider directly to check on the status.  Recommended Screenings:  Health Maintenance  Topic Date Due   DEXA scan (bone density measurement)  Never done   Colon Cancer Screening  02/22/2024   COVID-19 Vaccine (4 - 2025-26 season) 06/01/2024*   Hepatitis B Vaccine (3 of 3 - 19+ 3-dose series) 02/25/2025*   Screening for Lung Cancer  03/12/2025   Pap with HPV screening  04/26/2025   Medicare Annual Wellness Visit  05/18/2025   Breast Cancer Screening  03/11/2026   DTaP/Tdap/Td vaccine (3 - Td or Tdap) 01/21/2031   Pneumococcal Vaccine for age over 34  Completed   Flu Shot  Completed   Hepatitis C Screening  Completed   HIV Screening  Completed   Zoster (Shingles) Vaccine  Completed   Meningitis B Vaccine  Aged Out  *Topic was postponed. The date shown is not the original due date.       05/18/2024    2:36 PM  Advanced Directives  Does Patient  Have a Medical Advance Directive? No  Would patient like information on creating a medical advance directive? Yes (MAU/Ambulatory/Procedural Areas - Information given)    Vision: Annual vision screenings are recommended for early detection of glaucoma, cataracts, and diabetic retinopathy. These exams can also reveal signs of chronic conditions such as diabetes and high blood pressure.  Dental: Annual dental screenings help detect early signs of oral cancer, gum disease, and other conditions linked to overall health, including heart disease and diabetes.  Please see the attached documents for additional preventive care recommendations.     You have an order for:  []   2D Mammogram  []   3D Mammogram  [x]   Bone Density     Please call for appointment:  [facility]  [address] [place] [address] [phone] [facility] [address] [place], [place] [address] [phone] [facility] [address] [place], [place] [address] [phone]   Make sure to wear two-piece clothing.  No lotions, powders, or deodorants the day of the appointment. Make sure to bring picture ID and insurance card.  Bring list of medications you are currently taking including any supplements.   Schedule your [place] screening mammogram through MyChart!   Log into your MyChart account.  Go to 'Visit' (or 'Appointments' if on mobile App) --> Schedule an Appointment  Under 'Select  a Reason for Visit' choose the Mammogram Screening option.  Complete the pre-visit questions and select the time and place that best fits your schedule.    Be Involved in Caring For Your Health:  Taking Medications When medications are taken as directed, they can greatly improve your health. But if they are not taken as prescribed, they may not work. In some cases, not taking them correctly  can be harmful. To help ensure your treatment remains effective and safe, understand your medications and how to take them. Bring your medications to each visit for review by your provider.  Your lab results, notes, and after visit summary will be available on My Chart. We strongly encourage you to use this feature. If lab results are abnormal the clinic will contact you with the appropriate steps. If the clinic does not contact you assume the results are satisfactory. You can always view your results on My Chart. If you have questions regarding your health or results, please contact the clinic during office hours. You can also ask questions on My Chart.  We at [facility] are grateful that you chose us  to provide your care. We strive to provide evidence-based and compassionate care and are always looking for feedback. If you get a survey from the clinic please complete this so we can hear your opinions.   Eating Plan for Chronic Obstructive Pulmonary Disease Chronic obstructive pulmonary disease (COPD) causes symptoms such as shortness of breath, coughing, and chest discomfort. These symptoms can make it difficult to eat enough to maintain a healthy weight. Generally, people with COPD should eat a diet that is high in calories, protein, and other nutrients to maintain body weight and to keep the lungs as healthy as possible. Depending on the medicines you take and other health conditions you may have, your health care provider may give you additional recommendations on what to eat or avoid. Talk with your health care provider about your goals for body weight, and work with a dietitian to develop an eating plan that is right for you. What are tips for following this plan? Reading food labels  Avoid foods with more than 300 milligrams (mg) of salt (sodium) per serving. Choose foods that contain at least 4 grams (g) of fiber per serving. Try to eat 20-30 g of fiber each day. Choose foods  that are high in calories and protein, such as nuts, beans, yogurt, and cheese. Shopping Do not buy foods labeled as diet, low-calorie, or low-fat. If you are able to eat dairy products: Avoid low-fat or skim milk. Buy dairy products that have at least 2% fat. Buy nutritional supplement drinks. Buy grains and prepared foods labeled as enriched or fortified. Consider buying low-sodium, pre-made foods to conserve energy for eating. Cooking Add dry milk or protein powder to smoothies. Cook with healthy fats, such as olive oil, canola oil, sunflower oil, and grapeseed oil. Add oil, butter, cream cheese, or nut butters to foods to increase fat and calories. To make foods easier to chew and swallow: Cook vegetables, pasta, and rice until soft. Cut or grind meat into very small pieces. Dip breads in liquid. Meal planning  Eat when you feel hungry. Eat 5-6 small meals throughout the day. Drink 6-8 glasses of water each day. Do not drink liquids with meals. Drink liquids at the end of the meal to avoid feeling full too quickly. Eat a variety of fruits and vegetables every day. Ask for assistance from family or friends with planning  and preparing meals as needed. Avoid foods that cause you to feel bloated, such as carbonated drinks, fried foods, beans, broccoli, cabbage, and apples. For older adults, ask your local agency on aging whether you are eligible for meal assistance programs, such as Meals on Wheels. Lifestyle  Do not smoke. Eat slowly. Take small bites and chew food well before swallowing. Do not overeat. This may make it more difficult to breathe after eating. Sit up while eating. If needed, continue to use supplemental oxygen while eating. Rest or relax for 30 minutes before and after eating. Monitor your weight as told by your health care provider. Exercise as told by your health care provider. What foods should I eat? Fruits All fresh, dried, canned, or frozen fruits that  do not cause gas. Vegetables All fresh, canned (no salt added), or frozen vegetables that do not cause gas. Grains Whole-grain bread. Enriched whole-grain pasta. Fortified whole-grain cereals. Fortified rice. Quinoa. Meats and other proteins Lean meat. Poultry. Fish. Dried beans. Unsalted nuts. Tofu. Eggs. Nut butters. Dairy Whole or 2% milk. Cheese. Yogurt. Fats and oils Olive oil. Canola oil. Butter. Margarine. Beverages Water. Vegetable juice (no salt added). Decaffeinated coffee. Decaffeinated or herbal tea. Seasonings and condiments Fresh or dried herbs. Low-salt or salt-free seasonings. Low-sodium soy sauce. The items listed above may not be a complete list of foods and beverages you can eat. Contact a dietitian for more information. What foods should I avoid? Fruits Fruits that cause gas, such as apples or melon. Vegetables Vegetables that cause gas, such as broccoli, Brussels sprouts, cabbage, cauliflower, and onions. Canned vegetables with added salt. Meats and other proteins Fried meat. Salt-cured meat. Processed meat. Dairy Fat-free or low-fat milk, yogurt, or cheese. Processed cheese. Beverages Carbonated drinks. Caffeinated drinks, such as coffee, tea, and soft drinks. Juice. Alcohol. Vegetable juice with added salt. Seasonings and condiments Salt. Seasoning mixes with salt. Soy sauce. Dene. Other foods Clear soup or broth. Fried foods. Prepared frozen meals. The items listed above may not be a complete list of foods and beverages you should avoid. Contact a dietitian for more information. Summary COPD symptoms can make it difficult to eat enough to maintain a healthy weight. A COPD eating plan can help you maintain your body weight and keep your lungs as healthy as possible. Eat a diet that is high in calories, protein, and other nutrients. Read labels to make sure that you are getting the right nutrients. Cook foods to make them easier to chew and swallow. Eat  5-6 small meals throughout the day, and avoid foods that cause gas or make you feel bloated. This information is not intended to replace advice given to you by your health care provider. Make sure you discuss any questions you have with your health care provider. Document Revised: 05/03/2023 Document Reviewed: 05/03/2023 Elsevier Patient Education  2024 Elsevier Inc. Bone Density Test: What to Expect A bone density test uses a type of X-ray to measure the amount of calcium  and other minerals in your bones. It can measure bone density in the hip and the spine. This test may also be called: Bone densitometry. Bone mineral density test. Dual-energy X-ray absorptiometry (DEXA). You may have this test to: Diagnose or screen for a condition that causes weak or thin bones, called osteoporosis. See what your risk is for a broken bone, also called a fracture. Check how well your treatment for weak or thin bones is working. The test is similar to having a regular X-ray. Tell  a health care provider about: Any allergies you have. All medicines you're taking, including vitamins, herbs, eye drops, creams, and over-the-counter medicines. Any problems you or family members have had with anesthesia. Any bleeding problems you have. Any surgeries you've had. Any medical conditions you have. Whether you're pregnant or may be pregnant. Any medical tests you've had within the past 14 days that used contrast. What are the risks? Your health care provider will talk with you about risks. These may include: Being exposed to a small amount of radiation. This can slightly increase your cancer risk. What happens before the test? Do not take any calcium  supplements within the 24 hours before your test. You'll need to take off: All metal jewelry. Eyeglasses. Removable dental appliances. Any other metal objects on your body. What happens during the test?  You'll lie down on an exam table. There will be an X-ray  machine below you and an imaging device above you. Other devices, such as boxes or braces, may be used to position your body for the scan. The machine will slowly scan your body. You'll need to keep very still while the machine does the scan. The images will show up on a screen in the room. Images will be checked by a specialist after your test is finished. These steps may vary. Ask what you can expect. What can I expect after the test? Ask when your results will be ready and how to get them. You may need to call or meet with your provider to get your results. This information is not intended to replace advice given to you by your health care provider. Make sure you discuss any questions you have with your health care provider. Document Revised: 11/04/2022 Document Reviewed: 11/04/2022 Elsevier Patient Education  2024 [employer]. Fall Prevention in the Home, Adult Falls can cause injuries and can happen to people of all ages. There are many things you can do to make your home safer and to help prevent falls. What actions can I take to prevent falls? General information Use good lighting in all rooms. Make sure to: Replace any light bulbs that burn out. Turn on the lights in dark areas and use night-lights. Keep items that you use often in easy-to-reach places. Lower the shelves around your home if needed. Move furniture so that there are clear paths around it. Do not use throw rugs or other things on the floor that can make you trip. If any of your floors are uneven, fix them. Add color or contrast paint or tape to clearly mark and help you see: Grab bars or handrails. First and last steps of staircases. Where the edge of each step is. If you use a ladder or stepladder: Make sure that it is fully opened. Do not climb a closed ladder. Make sure the sides of the ladder are locked in place. Have someone hold the ladder while you use it. Know where your pets are as you move through your  home. What can I do in the bathroom?     Keep the floor dry. Clean up any water on the floor right away. Remove soap buildup in the bathtub or shower. Buildup makes bathtubs and showers slippery. Use non-skid mats or decals on the floor of the bathtub or shower. Attach bath mats securely with double-sided, non-slip rug tape. If you need to sit down in the shower, use a non-slip stool. Install grab bars by the toilet and in the bathtub and shower. Do not use  towel bars as grab bars. What can I do in the bedroom? Make sure that you have a light by your bed that is easy to reach. Do not use any sheets or blankets on your bed that hang to the floor. Have a firm chair or bench with side arms that you can use for support when you get dressed. What can I do in the kitchen? Clean up any spills right away. If you need to reach something above you, use a step stool with a grab bar. Keep electrical cords out of the way. Do not use floor polish or wax that makes floors slippery. What can I do with my stairs? Do not leave anything on the stairs. Make sure that you have a light switch at the top and the bottom of the stairs. Make sure that there are handrails on both sides of the stairs. Fix handrails that are broken or loose. Install non-slip stair treads on all your stairs if they do not have carpet. Avoid having throw rugs at the top or bottom of the stairs. Choose a carpet that does not hide the edge of the steps on the stairs. Make sure that the carpet is firmly attached to the stairs. Fix carpet that is loose or worn. What can I do on the outside of my home? Use bright outdoor lighting. Fix the edges of walkways and driveways and fix any cracks. Clear paths of anything that can make you trip, such as tools or rocks. Add color or contrast paint or tape to clearly mark and help you see anything that might make you trip as you walk through a door, such as a raised step or threshold. Trim any  bushes or trees on paths to your home. Check to see if handrails are loose or broken and that both sides of all steps have handrails. Install guardrails along the edges of any raised decks and porches. Have leaves, snow, or ice cleared regularly. Use sand, salt, or ice melter on paths if you live where there is ice and snow during the winter. Clean up any spills in your garage right away. This includes grease or oil spills. What other actions can I take? Review your medicines with your doctor. Some medicines can cause dizziness or changes in blood pressure, which increase your risk of falling. Wear shoes that: Have a low heel. Do not wear high heels. Have rubber bottoms and are closed at the toe. Feel good on your feet and fit well. Use tools that help you move around if needed. These include: Canes. Walkers. Scooters. Crutches. Ask your doctor what else you can do to help prevent falls. This may include seeing a physical therapist to learn to do exercises to move better and get stronger. Where to find more information Centers for Disease Control and Prevention, [employer]: tonerpromos.no [employer] on Aging: baseringtones.pl National Institute on Aging: baseringtones.pl Contact a doctor if: You are afraid of falling at home. You feel weak, drowsy, or dizzy at home. You fall at home. Get help right away if you: Lose consciousness or have trouble moving after a fall. Have a fall that causes a head injury. These symptoms may be an emergency. Get help right away. Call 911. Do not wait to see if the symptoms will go away. Do not drive yourself to the hospital. This information is not intended to replace advice given to you by your health care provider. Make sure you discuss any questions you have with your health care  provider. Document Revised: 02/26/2022 Document Reviewed: 02/26/2022 Elsevier Patient Education  2024 Elsevier Inc. Health Maintenance, Female Adopting a healthy lifestyle and getting  preventive care are important in promoting health and wellness. Ask your health care provider about: The right schedule for you to have regular tests and exams. Things you can do on your own to prevent diseases and keep yourself healthy. What should I know about diet, weight, and exercise? Eat a healthy diet  Eat a diet that includes plenty of vegetables, fruits, low-fat dairy products, and lean protein. Do not eat a lot of foods that are high in solid fats, added sugars, or sodium. Maintain a healthy weight Body mass index (BMI) is used to identify weight problems. It estimates body fat based on height and weight. Your health care provider can help determine your BMI and help you achieve or maintain a healthy weight. Get regular exercise Get regular exercise. This is one of the most important things you can do for your health. Most adults should: Exercise for at least 150 minutes each week. The exercise should increase your heart rate and make you sweat (moderate-intensity exercise). Do strengthening exercises at least twice a week. This is in addition to the moderate-intensity exercise. Spend less time sitting. Even light physical activity can be beneficial. Watch cholesterol and blood lipids Have your blood tested for lipids and cholesterol at 65 years of age, then have this test every 5 years. Have your cholesterol levels checked more often if: Your lipid or cholesterol levels are high. You are older than 65 years of age. You are at high risk for heart disease. What should I know about cancer screening? Depending on your health history and family history, you may need to have cancer screening at various ages. This may include screening for: Breast cancer. Cervical cancer. Colorectal cancer. Skin cancer. Lung cancer. What should I know about heart disease, diabetes, and high blood pressure? Blood pressure and heart disease High blood pressure causes heart disease and increases the  risk of stroke. This is more likely to develop in people who have high blood pressure readings or are overweight. Have your blood pressure checked: Every 3-5 years if you are 84-13 years of age. Every year if you are 91 years old or older. Diabetes Have regular diabetes screenings. This checks your fasting blood sugar level. Have the screening done: Once every three years after age 35 if you are at a normal weight and have a low risk for diabetes. More often and at a younger age if you are overweight or have a high risk for diabetes. What should I know about preventing infection? Hepatitis B If you have a higher risk for hepatitis B, you should be screened for this virus. Talk with your health care provider to find out if you are at risk for hepatitis B infection. Hepatitis C Testing is recommended for: Everyone born from [birth year] through 1967. Anyone with known risk factors for hepatitis C. Sexually transmitted infections (STIs) Get screened for STIs, including gonorrhea and chlamydia, if: You are sexually active and are younger than 65 years of age. You are older than 65 years of age and your health care provider tells you that you are at risk for this type of infection. Your sexual activity has changed since you were last screened, and you are at increased risk for chlamydia or gonorrhea. Ask your health care provider if you are at risk. Ask your health care provider about whether you are at high risk  for HIV. Your health care provider may recommend a prescription medicine to help prevent HIV infection. If you choose to take medicine to prevent HIV, you should first get tested for HIV. You should then be tested every 3 months for as long as you are taking the medicine. Pregnancy If you are about to stop having your period (premenopausal) and you may become pregnant, seek counseling before you get pregnant. Take 400 to 800 micrograms (mcg) of folic acid every day if you become pregnant. Ask for  birth control (contraception) if you want to prevent pregnancy. Osteoporosis and menopause Osteoporosis is a disease in which the bones lose minerals and strength with aging. This can result in bone fractures. If you are 60 years old or older, or if you are at risk for osteoporosis and fractures, ask your health care provider if you should: Be screened for bone loss. Take a calcium  or vitamin D supplement to lower your risk of fractures. Be given hormone replacement therapy (HRT) to treat symptoms of menopause. Follow these instructions at home: Alcohol use Do not drink alcohol if: Your health care provider tells you not to drink. You are pregnant, may be pregnant, or are planning to become pregnant. If you drink alcohol: Limit how much you have to: 0-1 drink a day. Know how much alcohol is in your drink. In the [place] one drink equals one 12 oz bottle of beer (355 mL), one 5 oz glass of wine (148 mL), or one 1 oz glass of hard liquor (44 mL). Lifestyle Do not use any products that contain nicotine  or tobacco. These products include cigarettes, chewing tobacco, and vaping devices, such as e-cigarettes. If you need help quitting, ask your health care provider. Do not use street drugs. Do not share needles. Ask your health care provider for help if you need support or information about quitting drugs. General instructions Schedule regular health, dental, and eye exams. Stay current with your vaccines. Tell your health care provider if: You often feel depressed. You have ever been abused or do not feel safe at home. Summary Adopting a healthy lifestyle and getting preventive care are important in promoting health and wellness. Follow your health care provider's instructions about healthy diet, exercising, and getting tested or screened for diseases. Follow your health care provider's instructions on monitoring your cholesterol and blood pressure. This information is not intended to replace  advice given to you by your health care provider. Make sure you discuss any questions you have with your health care provider. Document Revised: 11/14/2020 Document Reviewed: 11/14/2020 Elsevier Patient Education  2024 [employer].

## 2024-05-18 ENCOUNTER — Ambulatory Visit (INDEPENDENT_AMBULATORY_CARE_PROVIDER_SITE_OTHER): Admitting: Nurse Practitioner

## 2024-05-18 ENCOUNTER — Encounter: Payer: Self-pay | Admitting: Nurse Practitioner

## 2024-05-18 VITALS — BP 116/72 | HR 89 | Temp 98.5°F | Resp 17 | Ht 65.2 in | Wt 155.6 lb

## 2024-05-18 DIAGNOSIS — Z780 Asymptomatic menopausal state: Secondary | ICD-10-CM

## 2024-05-18 DIAGNOSIS — Z0000 Encounter for general adult medical examination without abnormal findings: Secondary | ICD-10-CM

## 2024-05-18 DIAGNOSIS — J432 Centrilobular emphysema: Secondary | ICD-10-CM

## 2024-05-18 DIAGNOSIS — Z23 Encounter for immunization: Secondary | ICD-10-CM

## 2024-05-18 NOTE — Progress Notes (Signed)
 BP 116/72 (BP Location: Left Arm, Patient Position: Sitting, Cuff Size: Normal)   Pulse 89   Temp 98.5 F (36.9 C) (Oral)   Resp 17   Ht 5' 5.2 (1.656 m)   Wt 155 lb 9.6 oz (70.6 kg)   LMP 09/07/2010 (Approximate)   SpO2 97%   BMI 25.74 kg/m    Subjective:    Patient ID: Aimee Lawson, female    DOB: 05/07/1961, 63 y.o.   MRN: [id number]  HPI: Aimee Lawson is a 63 y.o. female presenting on 05/18/2024 for Welcome To Medicare Visit. Current medical complaints include:none  She currently lives with: self Menopausal Symptoms: no     02/20/2023   10:06 AM 07/15/2023    3:00 PM 05/05/2024    4:26 PM 05/17/2024    4:44 PM 05/18/2024    2:32 PM  Fall Risk  Falls in the past year? 0 0 0 1 0  Was there an injury with Fall? 0 0 0 0 0  Fall Risk Category Calculator 0 0 0 1  0  Patient at Risk for Falls Due to No Fall Risks No Fall Risks No Fall Risks  No Fall Risks  Fall risk Follow up Falls evaluation completed Falls evaluation completed Falls evaluation completed  Falls evaluation completed     Patient-reported     Depression Screen done today and results listed below:     05/18/2024    2:33 PM 05/05/2024    4:26 PM 07/15/2023    3:00 PM 02/20/2023   10:06 AM 08/22/2022    2:25 PM  Depression screen PHQ 2/9  Decreased Interest 0 0 0 0 0  Down, Depressed, Hopeless 0 0 0 0 0  PHQ - 2 Score 0 0 0 0 0  Altered sleeping 0  0 0 0  Tired, decreased energy 0  0 2 0  Change in appetite 0  0 2 0  Feeling bad or failure about yourself  0  0 0 0  Trouble concentrating 0  0 0 0  Moving slowly or fidgety/restless 0  0 0 0  Suicidal thoughts 0  0 0 0  PHQ-9 Score 0  0  4  0   Difficult doing work/chores   Not difficult at all Not difficult at all Not difficult at all     Data saved with a previous flowsheet row definition      05/18/2024    2:33 PM 07/15/2023    3:01 PM 02/20/2023   10:07 AM 08/22/2022    2:25 PM  GAD 7 : Generalized Anxiety Score  Nervous, Anxious, on Edge  0 0 0 0  Control/stop worrying 0 0 0 0  Worry too much - different things 0 0 0 0  Trouble relaxing 0 0 0 0  Restless 0 0 0 0  Easily annoyed or irritable 0 0 0 0  Afraid - awful might happen 0 0 0 0  Total GAD 7 Score 0 0 0 0  Anxiety Difficulty  Not difficult at all Not difficult at all Not difficult at all   Functional Status Survey: Is the patient deaf or have difficulty hearing?: (Patient-Rptd) No Does the patient have difficulty seeing, even when wearing glasses/contacts?: (Patient-Rptd) Yes Does the patient have difficulty concentrating, remembering, or making decisions?: (Patient-Rptd) No Does the patient have difficulty walking or climbing stairs?: (Patient-Rptd) Yes Does the patient have difficulty dressing or bathing?: (Patient-Rptd) No Does the patient have difficulty doing errands alone such  as visiting a doctor's office or shopping?: (Patient-Rptd) No   Past Medical History:  Past Medical History:  Diagnosis Date   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   Aortic atherosclerosis    Atypical mole 11/04/2013   Right superior lateral pubic   Emphysema lung (HCC)    Emphysema of lung (HCC) 2022   Mixed hyperlipidemia    SCC (squamous cell carcinoma) 04/27/2024   left pretibial - ED&C done recheck at followup   SCC (squamous cell carcinoma) 04/27/2024   left lateral pretibial - ED&C done - will recheck at followup   Squamous cell carcinoma of skin 10/31/2017   right pretibial    Symptomatic cholelithiasis     Surgical History:  Past Surgical History:  Procedure Laterality Date   AUGMENTATION MAMMAPLASTY     BREAST ENHANCEMENT SURGERY     20+ yrs ago   BREAST SURGERY     CHOLECYSTECTOMY  7/24   COLONOSCOPY N/A 02/21/2021   Procedure: COLONOSCOPY;  Surgeon: Therisa Bi, MD;  Location[facility] ENDOSCOPY;  Service: Gastroenterology;  Laterality: N/A;   COSMETIC SURGERY  2001   breast augmentation   FACIAL COSMETIC SURGERY      Medications:  Current Outpatient Medications on  File Prior to Visit  Medication Sig   albuterol  (PROVENTIL ) (2.5 MG/3ML) 0.083% nebulizer solution Take 3 mLs (2.5 mg total) by nebulization every 6 (six) hours as needed for wheezing or shortness of breath.   albuterol  (VENTOLIN  HFA) 108 (90 Base) MCG/ACT inhaler Inhale 2 puffs into the lungs every 6 (six) hours as needed for wheezing or shortness of breath.   atorvastatin  (LIPITOR) 20 MG tablet Take 1 tablet (20 mg total) by mouth daily.   calcipotriene  (DOVONOX) 0.005 % cream Apply topically 2 (two) times daily. For seven days   fluorouracil  (EFUDEX ) 5 % cream Apply topically 2 (two) times daily. For seven days   tirzepatide (MOUNJARO) 5 MG/0.5ML Pen Inject 5 mg into the skin once a week.   umeclidinium-vilanterol (ANORO ELLIPTA ) 62.5-25 MCG/ACT AEPB INHALE 1 PUFF BY MOUTH EVERY DAY   No current facility-administered medications on file prior to visit.    Allergies:  Allergies  Allergen Reactions   Penicillins Anaphylaxis    Social History:  Social History   Socioeconomic History   Marital status: Single    Spouse name: Not on file   Number of children: Not on file   Years of education: Not on file   Highest education level: Bachelor's degree (e.g., BA, AB, BS)  Occupational History   Not on file  Tobacco Use   Smoking status: Every Day    Current packs/day: 1.00    Average packs/day: 1.8 packs/day for 49.9 years (91.9 ttl pk-yrs)    Types: Cigarettes    Start date: 1976    Passive exposure: Past   Smokeless tobacco: Never  Vaping Use   Vaping status: Never Used  Substance and Sexual Activity   Alcohol use: Yes    Alcohol/week: 4.0 standard drinks of alcohol    Types: 4 Cans of beer per week    Comment: only 3 a week not 30 but won't change   Drug use: Never   Sexual activity: Not Currently    Birth control/protection: Post-menopausal, None  Other Topics Concern   Not on file  Social History Narrative   Not on file   Social Drivers of Health   Financial  Resource Strain: Low Risk  (05/04/2024)   Overall Financial Resource Strain (CARDIA)  Difficulty of Paying Living Expenses: Not hard at all  Food Insecurity: No Food Insecurity (05/18/2024)   Hunger Vital Sign    Worried About Running Out of Food in the Last Year: Never true    Ran Out of Food in the Last Year: Never true  Transportation Needs: No Transportation Needs (05/18/2024)   PRAPARE - Administrator, Civil Service (Medical): No    Lack of Transportation (Non-Medical): No  Physical Activity: Insufficiently Active (05/04/2024)   Exercise Vital Sign    Days of Exercise per Week: 2 days    Minutes of Exercise per Session: 10 min  Stress: No Stress Concern Present (05/04/2024)   [employer] of Occupational Health - Occupational Stress Questionnaire    Feeling of Stress: Not at all  Social Connections: Moderately Isolated (05/18/2024)   Social Connection and Isolation Panel    Frequency of Communication with Friends and Family: More than three times a week    Frequency of Social Gatherings with Friends and Family: More than three times a week    Attends Religious Services: More than 4 times per year    Active Member of [employer] or Organizations: No    Attends Banker Meetings: Never    Marital Status: Divorced  Catering Manager Violence: Not At Risk (05/18/2024)   Humiliation, Afraid, Rape, and Kick questionnaire    Fear of Current or Ex-Partner: No    Emotionally Abused: No    Physically Abused: No    Sexually Abused: No   Social History   Tobacco Use  Smoking Status Every Day   Current packs/day: 1.00   Average packs/day: 1.8 packs/day for 49.9 years (91.9 ttl pk-yrs)   Types: Cigarettes   Start date: 1976   Passive exposure: Past  Smokeless Tobacco Never   Social History   Substance and Sexual Activity  Alcohol Use Yes   Alcohol/week: 4.0 standard drinks of alcohol   Types: 4 Cans of beer per week   Comment: only 3 a week not 30 but  won't change    Family History:  Family History  Problem Relation Age of Onset   Heart attack Mother    Stroke Mother    Uterine cancer Mother    Colon cancer Mother    Cancer Mother    Obesity Mother    Heart attack Father    Diabetes Maternal Grandmother    Heart disease Maternal Grandmother    Heart disease Maternal Grandfather    Heart disease Paternal Grandmother    Heart disease Paternal Grandfather    Breast cancer Cousin    Hypertension Brother    Other Brother        Pacemaker   Emphysema Brother    COPD Brother    Sleep apnea Neg Hx     Past medical history, surgical history, medications, allergies, family history and social history reviewed with patient today and changes made to appropriate areas of the chart.   ROS All other ROS negative except what is listed above and in the HPI.      Objective:    BP 116/72 (BP Location: Left Arm, Patient Position: Sitting, Cuff Size: Normal)   Pulse 89   Temp 98.5 F (36.9 C) (Oral)   Resp 17   Ht 5' 5.2 (1.656 m)   Wt 155 lb 9.6 oz (70.6 kg)   LMP 09/07/2010 (Approximate)   SpO2 97%   BMI 25.74 kg/m   Wt Readings from Last 3 Encounters:  05/18/24 155 lb 9.6 oz (70.6 kg)  02/26/24 162 lb 6.4 oz (73.7 kg)  01/28/24 171 lb (77.6 kg)    Physical Exam Vitals and nursing note reviewed.  Constitutional:      General: She is awake. She is not in acute distress.    Appearance: She is well-developed and well-groomed. She is not ill-appearing or toxic-appearing.  HENT:     Head: Normocephalic and atraumatic.     Right Ear: Hearing, tympanic membrane, ear canal and external ear normal. No drainage.     Left Ear: Hearing, tympanic membrane, ear canal and external ear normal. No drainage.     Nose: Nose normal.     Right Sinus: No maxillary sinus tenderness or frontal sinus tenderness.     Left Sinus: No maxillary sinus tenderness or frontal sinus tenderness.     Mouth/Throat:     Mouth: Mucous membranes are moist.      Pharynx: Oropharynx is clear. Uvula midline. No pharyngeal swelling, oropharyngeal exudate or posterior oropharyngeal erythema.  Eyes:     General: Lids are normal.        Right eye: No discharge.        Left eye: No discharge.     Extraocular Movements: Extraocular movements intact.     Conjunctiva/sclera: Conjunctivae normal.     Pupils: Pupils are equal, round, and reactive to light.     Visual Fields: Right eye visual fields normal and left eye visual fields normal.  Neck:     Thyroid: No thyromegaly.     Vascular: No carotid bruit.     Trachea: Trachea normal.  Cardiovascular:     Rate and Rhythm: Normal rate and regular rhythm.     Heart sounds: Normal heart sounds. No murmur heard.    No gallop.  Pulmonary:     Effort: Pulmonary effort is normal. No accessory muscle usage or respiratory distress.     Breath sounds: Normal breath sounds.  Abdominal:     General: Bowel sounds are normal.     Palpations: Abdomen is soft. There is no hepatomegaly or splenomegaly.     Tenderness: There is no abdominal tenderness.  Musculoskeletal:        General: Normal range of motion.     Cervical back: Normal range of motion and neck supple.     Right lower leg: No edema.     Left lower leg: No edema.  Lymphadenopathy:     Head:     Right side of head: No submental, submandibular, tonsillar, preauricular or posterior auricular adenopathy.     Left side of head: No submental, submandibular, tonsillar, preauricular or posterior auricular adenopathy.     Cervical: No cervical adenopathy.  Skin:    General: Skin is warm and dry.     Capillary Refill: Capillary refill takes less than 2 seconds.     Findings: No rash.  Neurological:     Mental Status: She is alert and oriented to person, place, and time.     Gait: Gait is intact.     Deep Tendon Reflexes: Reflexes are normal and symmetric.     Reflex Scores:      Brachioradialis reflexes are 2+ on the right side and 2+ on the left  side.      Patellar reflexes are 2+ on the right side and 2+ on the left side. Psychiatric:        Attention and Perception: Attention normal.        Mood  and Affect: Mood normal.        Speech: Speech normal.        Behavior: Behavior normal. Behavior is cooperative.        Thought Content: Thought content normal.        Judgment: Judgment normal.    EKG My review and personal interpretation at Time: 1515   Indication: Medicare Wellness  Rate: 80  Rhythm: sinus Axis: normal Other: Artifact present due to lotion on chest and tremoring/cold. No nonspecific st abn, no stemi, no lvh.     05/18/2024    2:36 PM  6CIT Screen  What Year? 0 points  What month? 0 points  What time? 0 points  Count back from 20 0 points  Months in reverse 0 points  Repeat phrase 0 points  Total Score 0 points   Hearing Screening Results 20 dB HL   Right Ear Left Ear  500 Hz Pass [x]  Fail []  Pass [x]  Fail []   1,000 Hz Pass [x]  Fail []  Pass [x]  Fail []   2,000 Hz Pass [x]  Fail []  Pass [x]  Fail []   4,000 Hz Pass [x]  Fail []  Pass [x]  Fail []     25 dB HL   Right Ear Left Ear  500 Hz Pass []  Fail [x]  Pass [x]  Fail []   1,000 Hz Pass []  Fail [x]  Pass [x]  Fail []   2,000 Hz Pass [x]  Fail []  Pass [x]  Fail []   4,000 Hz Pass [x]  Fail []  Pass []  Fail [x]     40 dB HL   Right Ear Left Ear  500 Hz Pass []  Fail [x]  Pass [x]  Fail []   1,000 Hz Pass [x]  Fail []  Pass [x]  Fail []   2,000 Hz Pass [x]  Fail []  Pass [x]  Fail []   4,000 Hz Pass [x]  Fail []  Pass [x]  Fail []    Vision Screening   Right eye Left eye Both eyes  Without correction     With correction 20/40 20/40 20/20    Results for orders placed or performed in visit on 04/27/24  Surgical pathology   Collection Time: 04/27/24 12:00 AM  Result Value Ref Range   SURGICAL PATHOLOGY      SURGICAL PATHOLOGY [facility] [address] [place], [place] [address] Telephone [phone] or [phone] Fax [phone]  REPORT OF DERMATOPATHOLOGY   Accession #: [id number] Patient Name: LIOR, CARTELLI Visit # : [id number]  MRN: [id number] Cytotechnologist: Munoz-Bishop Md, Eleanor, Dermatopathologist, Electronic Signature DOB/Age August 10, 1960 (Age: 10) Gender: F Collected Date: 04/27/2024 Received Date: 04/28/2024  FINAL DIAGNOSIS       1. Skin, left pretibial :       SQUAMOUS CELL CARCINOMA IN SITU, BASE INVOLVED       2. Skin, left lat pretibial :       WELL DIFFERENTIATED SQUAMOUS CELL CARCINOMA       ELECTRONIC SIGNATURE : Munoz-Bishop Md, Melissa, Dermatopathologist, Electronic Signature  MICROSCOPIC DESCRIPTION 1. There is a proliferation of atypical keratinocytes which show slow surface maturation and, in areas, there is full thickness epidermal atypia.The base of the biopsy is involved and an invasiv e component cannot be excluded. 2. There is a proliferation of atypical epithelial cells with squamous differentiation invading the dermis.This is a well differentiated squamous cell carcinoma.  CASE COMMENTS STAINS USED IN DIAGNOSIS: H&E H&E    CLINICAL HISTORY  SPECIMEN(S) OBTAINED 1. Skin, Left Pretibial 2. Skin, Left Lat Pretibial  SPECIMEN COMMENTS: 1. 0.8 cm hyperkeratotic papule, ED&C today 2. 1.5  cm hyperkeratotic papule, ED&C today SPECIMEN CLINICAL INFORMATION: 1. Neoplasm of uncertain behavior of skin, R/O SCC 2. Neoplasm of uncertain behavior of skin, R/O SCC    Gross Description 1. Formalin fixed specimen received:  10 X 7 X 1 MM, TOTO (4 P) (1 B) ( mm ) 2. Formalin fixed specimen received:  15 X 13 X 3 MM, TOTO (5 P) (1 B) ( mm )        Report signed out from the following location(s) [facility] [address] [place], [place] [address] CLIA #: [id number]  [facility] [address] [place], [place] [address] CLIA #: [id number]       Assessment & Plan:   Problem List Items Addressed This  Visit       Respiratory   Centrilobular emphysema (HCC)   Chronic, ongoing, noted on screening.  Recommend complete cessation of smoking. Continue Anoro daily and Albuterol  PRN.  Continue annual lung screening.  Zpack sent for her travels as needed. Spirometry next visit.      Other Visit Diagnoses       Welcome to Medicare preventive visit    -  Primary   Welcome to Medicare today.   Relevant Orders   EKG 12-Lead     Postmenopausal estrogen deficiency       DEXA ordered and instructed how to schedule.   Relevant Orders   DG Bone Density     Flu vaccine need       Flu vaccine today, educated patient.     Encounter for Medicare annual wellness exam            Follow up plan: Return in 6 months (on 11/15/2024) for HTN/HLD, COPD.   LABORATORY TESTING:  - Pap smear: not applicable  IMMUNIZATIONS:   - Tdap: Tetanus vaccination status reviewed: last tetanus booster within 10 years. - Influenza: Up to date - Pneumovax: Up to date - Prevnar: Up to date - COVID: has had a few - HPV: Not applicable - Shingrix  vaccine: Up to date  SCREENING: -Mammogram: Up to date  - Colonoscopy: will call to schedule, is ordered  - Bone Density: Ordered today  -Hearing Test: Up to date  -Spirometry: Up to date   PATIENT COUNSELING:   Advised to take 1 mg of folate supplement per day if capable of pregnancy.   Sexuality: Discussed sexually transmitted diseases, partner selection, use of condoms, avoidance of unintended pregnancy  and contraceptive alternatives.   Advised to avoid cigarette smoking.  I discussed with the patient that most people either abstain from alcohol or drink within safe limits (<=14/week and <=4 drinks/occasion for males, <=7/weeks and <= 3 drinks/occasion for females) and that the risk for alcohol disorders and other health effects rises proportionally with the number of drinks per week and how often a drinker exceeds daily limits.  Discussed cessation/primary  prevention of drug use and availability of treatment for abuse.   Diet: Encouraged to adjust caloric intake to maintain  or achieve ideal body weight, to reduce intake of dietary saturated fat and total fat, to limit sodium intake by avoiding high sodium foods and not adding table salt, and to maintain adequate dietary potassium and calcium  preferably from fresh fruits, vegetables, and low-fat dairy products.    Stressed the importance of regular exercise  Injury prevention: Discussed safety belts, safety helmets, smoke detector, smoking near bedding or upholstery.   Dental health: Discussed importance of regular tooth brushing,  flossing, and dental visits.    NEXT PREVENTATIVE PHYSICAL DUE IN 1 YEAR. Return in 6 months (on 11/15/2024) for HTN/HLD, COPD.

## 2024-05-18 NOTE — Progress Notes (Signed)
 Subjective:   Aimee Lawson is a 63 y.o. female who presents for a Welcome to Medicare Exam.   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   Aortic atherosclerosis    Atypical mole 11/04/2013   Right superior lateral pubic   Emphysema lung (HCC)    Emphysema of lung (HCC) 2022   Mixed hyperlipidemia    SCC (squamous cell carcinoma) 04/27/2024   left pretibial - ED&C done recheck at followup   SCC (squamous cell carcinoma) 04/27/2024   left lateral pretibial - ED&C done - will recheck at followup   Squamous cell carcinoma of skin 10/31/2017   right pretibial    Symptomatic cholelithiasis    Past Surgical History:  Procedure Laterality Date   AUGMENTATION MAMMAPLASTY     BREAST ENHANCEMENT SURGERY     20+ yrs ago   BREAST SURGERY     CHOLECYSTECTOMY  7/24   COLONOSCOPY N/A 02/21/2021   Procedure: COLONOSCOPY;  Surgeon: Therisa Bi, MD;  Location: [facility] ENDOSCOPY;  Service: Gastroenterology;  Laterality: N/A;   COSMETIC SURGERY  2001   breast augmentation   FACIAL COSMETIC SURGERY     Family History  Problem Relation Age of Onset   Heart attack Mother    Stroke Mother    Uterine cancer Mother    Colon cancer Mother    Cancer Mother    Obesity Mother    Heart attack Father    Diabetes Maternal Grandmother    Heart disease Maternal Grandmother    Heart disease Maternal Grandfather    Heart disease Paternal Grandmother    Heart disease Paternal Grandfather    Breast cancer Cousin    Hypertension Brother    Other Brother        Pacemaker   Emphysema Brother    COPD Brother    Sleep apnea Neg Hx    Social History   Occupational History   Not on file  Tobacco Use   Smoking status: Every Day    Current packs/day: 1.00    Average packs/day: 1.8 packs/day for 49.9 years (91.9 ttl pk-yrs)    Types: Cigarettes    Start date: 1976    Passive exposure: Past   Smokeless tobacco: Never   Vaping Use   Vaping status: Never Used  Substance and Sexual Activity   Alcohol use: Yes    Alcohol/week: 4.0 standard drinks of alcohol    Types: 4 Cans of beer per week    Comment: only 3 a week not 30 but won't change   Drug use: Never   Sexual activity: Not Currently    Birth control/protection: Post-menopausal, None   Tobacco Counseling Ready to quit: No Counseling given: No  SDOH Screenings   Food Insecurity: No Food Insecurity (05/18/2024)  Housing: Low Risk  (05/04/2024)  Transportation Needs: No Transportation Needs (05/18/2024)  Utilities: Not At Risk (05/18/2024)  Alcohol Screen: Low Risk  (05/04/2024)  Depression (PHQ2-9): Low Risk  (05/18/2024)  Financial Resource Strain: Low Risk  (05/04/2024)  Physical Activity: Insufficiently Active (05/04/2024)  Social Connections: Moderately Isolated (05/18/2024)  Stress: No Stress Concern Present (05/04/2024)  Tobacco Use: High Risk (05/18/2024)  Health Literacy: Adequate Health Literacy (05/18/2024)   Depression Screen    05/18/2024    2:33 PM 05/05/2024    4:26 PM 07/15/2023    3:00 PM 02/20/2023   10:06 AM 08/22/2022    2:25 PM 01/22/2022  1:14 PM 11/22/2021    2:32 PM  PHQ 2/9 Scores  PHQ - 2 Score 0 0 0 0 0 0 0  PHQ- 9 Score 0  0  4  0  3  0      Data saved with a previous flowsheet row definition     Goals Addressed               This Visit's Progress     DIET - INCREASE WATER INTAKE (pt-stated)        Travel as much as I can (pt-stated)         Visit info / Clinical Intake: Medicare Wellness Visit Type:: Welcome to [employer] (IPPE) Persons participating in visit:: patient Medicare Wellness Visit Mode:: In-person (required for WTM) Information given by:: patient Interpreter Needed?: No Pre-visit prep was completed: yes AWV questionnaire completed by patient prior to visit?: yes Date:: 05/18/24 Living arrangements:: (!) lives alone Patient's Overall Health Status Rating: very good Typical  amount of pain: none Does pain affect daily life?: no Are you currently prescribed opioids?: no  Dietary Habits and Nutritional Risks How many meals a day?: 2 Eats fruit and vegetables daily?: yes Most meals are obtained by: preparing own meals Diabetic:: no  Functional Status Activities of Daily Living (to include ambulation/medication): Independent Ambulation: Independent Home Management: Independent Manage your own finances?: yes Primary transportation is: driving Concerns about vision?: (!) yes (Wears glasses) Concerns about hearing?: no  Fall Screening Falls in the past year?: 0 Number of falls in past year: 0 Was there an injury with Fall?: 0 Fall Risk Category Calculator: 0 Patient Fall Risk Level: Low Fall Risk  Fall Risk Patient at Risk for Falls Due to: No Fall Risks Fall risk Follow up: Falls evaluation completed  Home and Transportation Safety: All rugs have non-skid backing?: (!) no All stairs or steps have railings?: (!) no Grab bars in the bathtub or shower?: (!) no Have non-skid surface in bathtub or shower?: (!) no Good home lighting?: yes Regular seat belt use?: yes Hospital stays in the last year:: no  Cognitive Assessment Difficulty concentrating, remembering, or making decisions? : no Will 6CIT or Mini Cog be Completed: yes What year is it?: 0 points What month is it?: 0 points Give patient an address phrase to remember (5 components): [address] [facility] [address] About what time is it?: 0 points Count backwards from 20 to 1: 0 points Say the months of the year in reverse: 0 points Repeat the address phrase from earlier: 0 points 6 CIT Score: 0 points  Advance Directives (For Healthcare) Does Patient Have a Medical Advance Directive?: No Would patient like information on creating a medical advance directive?: Yes (MAU/Ambulatory/Procedural Areas - Information given)  Reviewed/Updated  Reviewed/Updated: Reviewed All (Medical, Surgical,  Family, Medications, Allergies, Care Teams, Patient Goals); Medical History; Surgical History; Family History; Medications; Allergies; Care Teams; Patient Goals         Objective:    Today's Vitals   05/18/24 1431  BP: 116/72  Pulse: 89  Resp: 17  Temp: 98.5 F (36.9 C)  TempSrc: Oral  SpO2: 97%  Weight: 155 lb 9.6 oz (70.6 kg)  Height: 5' 5.2 (1.656 m)  PainSc: 0-No pain   Body mass index is 25.74 kg/m.   Physical Exam   Current Medications (verified) Outpatient Encounter Medications as of 05/18/2024  Medication Sig   albuterol  (PROVENTIL ) (2.5 MG/3ML) 0.083% nebulizer solution Take 3 mLs (2.5 mg total) by nebulization every  6 (six) hours as needed for wheezing or shortness of breath.   albuterol  (VENTOLIN  HFA) 108 (90 Base) MCG/ACT inhaler Inhale 2 puffs into the lungs every 6 (six) hours as needed for wheezing or shortness of breath.   atorvastatin  (LIPITOR) 20 MG tablet Take 1 tablet (20 mg total) by mouth daily.   calcipotriene  (DOVONOX) 0.005 % cream Apply topically 2 (two) times daily. For seven days   chlorpheniramine-HYDROcodone (TUSSIONEX) 10-8 MG/5ML Take 5 mLs by mouth at bedtime as needed.   fluorouracil  (EFUDEX ) 5 % cream Apply topically 2 (two) times daily. For seven days   tirzepatide (MOUNJARO) 5 MG/0.5ML Pen Inject 5 mg into the skin once a week.   umeclidinium-vilanterol (ANORO ELLIPTA ) 62.5-25 MCG/ACT AEPB INHALE 1 PUFF BY MOUTH EVERY DAY   No facility-administered encounter medications on file as of 05/18/2024.   Hearing/Vision screen Vision Screening   Right eye Left eye Both eyes  Without correction     With correction 20/40 20/40 20/20    Immunizations and Health Maintenance Health Maintenance  Topic Date Due   Medicare Annual Wellness (AWV)  Never done   DEXA SCAN  Never done   Colonoscopy  02/22/2024   COVID-19 Vaccine (4 - 2025-26 season) 06/01/2024 (Originally 03/09/2024)   Hepatitis B Vaccines 19-59 Average Risk (3 of 3 - 19+ 3-dose  series) 02/25/2025 (Originally 01/25/2021)   Lung Cancer Screening  03/12/2025   Cervical Cancer Screening (HPV/Pap Cotest)  04/26/2025   Mammogram  03/11/2026   DTaP/Tdap/Td (3 - Td or Tdap) 01/21/2031   Pneumococcal Vaccine: 50+ Years  Completed   Influenza Vaccine  Completed   Hepatitis C Screening  Completed   HIV Screening  Completed   Zoster Vaccines- Shingrix   Completed   Meningococcal B Vaccine  Aged Out    EKG: normal EKG, normal sinus rhythm     Assessment/Plan:  This is a routine wellness examination for Jarelis.  Patient Care Team: Cannady, Jolene T, NP as PCP - General (Nurse Practitioner)  I have personally reviewed and noted the following in the patient's chart:   Medical and social history Use of alcohol, tobacco or illicit drugs  Current medications and supplements including opioid prescriptions. Functional ability and status Nutritional status Physical activity Advanced directives List of other physicians Hospitalizations, surgeries, and ER visits in previous 12 months Vitals Screenings to include cognitive, depression, and falls Referrals and appointments  Orders Placed This Encounter  Procedures   DG Bone Density    Standing Status:   Future    Expiration Date:   05/16/2025    Reason for Exam (SYMPTOM  OR DIAGNOSIS REQUIRED):   postmenopausal estrogen deficiency    Preferred imaging location?:   [facility]   Flu vaccine HIGH DOSE PF(Fluzone Trivalent)   EKG 12-Lead   In addition, I have reviewed and discussed with patient certain preventive protocols, quality metrics, and best practice recommendations. A written personalized care plan for preventive services as well as general preventive health recommendations were provided to patient.   Comer HERO Rudell Ortman, CMA   05/18/2024

## 2024-05-18 NOTE — Assessment & Plan Note (Signed)
 Chronic, ongoing, noted on screening.  Recommend complete cessation of smoking. Continue Anoro daily and Albuterol  PRN.  Continue annual lung screening.  Zpack sent for her travels as needed. Spirometry next visit.

## 2024-05-25 ENCOUNTER — Telehealth: Payer: Self-pay | Admitting: Nurse Practitioner

## 2024-05-25 MED ORDER — HYDROCOD POLI-CHLORPHE POLI ER 10-8 MG/5ML PO SUER: 5.0000 mL | Freq: Every evening | ORAL | 0 refills | Status: DC | PRN

## 2024-05-25 NOTE — Telephone Encounter (Signed)
 Copied from CRM #[id number]. Topic: Clinical - Prescription Issue >> May 25, 2024 12:20 PM Aimee Lawson wrote: Reason for CRM: pharmacy called in and stated that at time medication chlorpheniramine-HYDROcodone (TUSSIONEX) 10-8 MG/5ML was dispensed patient only received due to that was all that was avail at the time. She's requesting a refill of this medication because she didn't receive the full . As per pharmacist once she agreed to the it cancelled the remaining that wasn't received.

## 2024-05-25 NOTE — Addendum Note (Signed)
 Addended by: Adelfo Diebel T on: 05/25/2024 02:29 PM   Modules accepted: Orders

## 2024-06-01 ENCOUNTER — Encounter: Payer: Self-pay | Admitting: Nurse Practitioner

## 2024-06-03 ENCOUNTER — Ambulatory Visit (INDEPENDENT_AMBULATORY_CARE_PROVIDER_SITE_OTHER): Admitting: Nurse Practitioner

## 2024-06-03 ENCOUNTER — Encounter: Payer: Self-pay | Admitting: Nurse Practitioner

## 2024-06-03 VITALS — BP 129/73 | HR 79 | Temp 98.7°F | Ht 65.0 in | Wt 160.8 lb

## 2024-06-03 DIAGNOSIS — R399 Unspecified symptoms and signs involving the genitourinary system: Secondary | ICD-10-CM | POA: Diagnosis not present

## 2024-06-03 DIAGNOSIS — J441 Chronic obstructive pulmonary disease with (acute) exacerbation: Secondary | ICD-10-CM

## 2024-06-03 LAB — URINALYSIS, ROUTINE W REFLEX MICROSCOPIC
Bilirubin, UA: NEGATIVE
Glucose, UA: NEGATIVE
Ketones, UA: NEGATIVE
Leukocytes,UA: NEGATIVE
Nitrite, UA: NEGATIVE
Protein,UA: NEGATIVE
RBC, UA: NEGATIVE
Specific Gravity, UA: 1.025 (ref 1.005–1.030)
Urobilinogen, Ur: 0.2 mg/dL (ref 0.2–1.0)
pH, UA: 5.5 (ref 5.0–7.5)

## 2024-06-03 LAB — WET PREP FOR TRICH, YEAST, CLUE
Clue Cell Exam: NEGATIVE
Trichomonas Exam: NEGATIVE
Yeast Exam: NEGATIVE

## 2024-06-03 MED ORDER — FLUCONAZOLE 150 MG PO TABS
150.0000 mg | ORAL_TABLET | Freq: Every day | ORAL | 0 refills | Status: AC
Start: 2024-06-03 — End: ?

## 2024-06-03 MED ORDER — DOXYCYCLINE HYCLATE 100 MG PO TABS: 100.0000 mg | ORAL_TABLET | Freq: Two times a day (BID) | ORAL | 0 refills | Status: AC

## 2024-06-03 NOTE — Assessment & Plan Note (Signed)
 Acute for a little over one week, tried Prednisone  at home with no benefit.  Symptoms ongoing.  Will send in Doxycycline  100 MG BID For 7 days. Recommend complete cessation of smoking, she is cutting back.  Continue inhalers at home.  Recommend: - Increased rest - Increasing Fluids - Acetaminophen  / ibuprofen  as needed for fever/pain.  - Salt water gargling, chloraseptic spray and throat lozenges - Mucinex.  - Saline sinus flushes or a neti pot.  - Humidifying the air.  - Strict ER precautions given.

## 2024-06-03 NOTE — Patient Instructions (Signed)

## 2024-06-03 NOTE — Progress Notes (Signed)
 BP 129/73   Pulse 79   Temp 98.7 F (37.1 C) (Oral)   Ht 5' 5 (1.651 m)   Wt 160 lb 12.8 oz (72.9 kg)   LMP 09/07/2010 (Approximate)   SpO2 97%   BMI 26.76 kg/m    Subjective:    Patient ID: Aimee Lawson, female    DOB: 03-02-1961, 63 y.o.   MRN: [id number]  HPI: Aimee Lawson is a 63 y.o. female  Chief Complaint  Patient presents with   Chest Congestion    Patient states she has been having chest congestion for the last 2 weeks. States she has been traveling and would like to have her lungs listened to. States she just finished a round of Prednisone  that helped some but not a lot.    Urinary Tract Infection    Patient states she was having lower back pain and extra bubbles in her urine, requesting to be checked for a UTI.    UPPER RESPIRATORY TRACT INFECTION Present for the last 2 weeks. Not improving. She did try 5 days of Prednisone  40 MG without benefit. Did have some urinary symptoms recently too and would like this checked. Fever: no Cough: yes Shortness of breath: yes Wheezing: yes Chest pain: no Chest tightness: yes Chest congestion: yes Nasal congestion: yes Runny nose: yes Post nasal drip: no Sneezing: no Sore throat: no Swollen glands: no Sinus pressure: no Headache: yes Face pain: no Toothache: no Ear pain: none Ear pressure: none Eyes red/itching:no Eye drainage/crusting: no  Vomiting: no Rash: no Fatigue: yes Sick contacts: yes Strep contacts: no  Context: fluctuating Recurrent sinusitis: no Relief with OTC cold/cough medications: no  Treatments attempted: cold/sinus and mucinex, Prednisone , inhalers.    Relevant past medical, surgical, family and social history reviewed and updated as indicated. Interim medical history since our last visit reviewed. Allergies and medications reviewed and updated.  Review of Systems  Constitutional:  Positive for fatigue. Negative for activity change, appetite change, chills and fever.  HENT:   Positive for congestion, postnasal drip, rhinorrhea and sinus pressure. Negative for ear discharge, ear pain, facial swelling, sinus pain, sneezing, sore throat and voice change.   Respiratory:  Positive for cough, chest tightness, shortness of breath and wheezing.   Cardiovascular:  Negative for chest pain, palpitations and leg swelling.  Gastrointestinal: Negative.   Genitourinary:  Positive for dysuria. Negative for decreased urine volume, frequency, hematuria and urgency.  Neurological:  Positive for headaches. Negative for dizziness and numbness.  Psychiatric/Behavioral: Negative.      Per HPI unless specifically indicated above     Objective:    BP 129/73   Pulse 79   Temp 98.7 F (37.1 C) (Oral)   Ht 5' 5 (1.651 m)   Wt 160 lb 12.8 oz (72.9 kg)   LMP 09/07/2010 (Approximate)   SpO2 97%   BMI 26.76 kg/m   Wt Readings from Last 3 Encounters:  06/03/24 160 lb 12.8 oz (72.9 kg)  05/18/24 155 lb 9.6 oz (70.6 kg)  02/26/24 162 lb 6.4 oz (73.7 kg)    Physical Exam Vitals and nursing note reviewed.  Constitutional:      General: She is awake. She is not in acute distress.    Appearance: She is well-developed and well-groomed. She is not ill-appearing or toxic-appearing.  HENT:     Head: Normocephalic.     Right Ear: Hearing, ear canal and external ear normal. A middle ear effusion is present. Tympanic membrane is not injected  or perforated.     Left Ear: Hearing, ear canal and external ear normal. A middle ear effusion is present. Tympanic membrane is not injected or perforated.     Nose: Rhinorrhea present. Rhinorrhea is clear.     Right Sinus: No maxillary sinus tenderness or frontal sinus tenderness.     Left Sinus: No maxillary sinus tenderness or frontal sinus tenderness.     Mouth/Throat:     Mouth: Mucous membranes are moist.     Pharynx: Posterior oropharyngeal erythema (mild) present. No pharyngeal swelling or oropharyngeal exudate.  Eyes:     General: Lids are  normal.        Right eye: No discharge.        Left eye: No discharge.     Conjunctiva/sclera: Conjunctivae normal.     Pupils: Pupils are equal, round, and reactive to light.  Neck:     Thyroid: No thyromegaly.     Vascular: No carotid bruit.  Cardiovascular:     Rate and Rhythm: Normal rate and regular rhythm.     Heart sounds: Normal heart sounds. No murmur heard.    No gallop.  Pulmonary:     Effort: Pulmonary effort is normal. No accessory muscle usage or respiratory distress.     Breath sounds: Wheezing present. No decreased breath sounds or rales.     Comments: Occasional expiratory wheezes noted throughout. No SOB with talking. Abdominal:     General: Bowel sounds are normal.     Palpations: Abdomen is soft. There is no hepatomegaly or splenomegaly.  Musculoskeletal:     Cervical back: Normal range of motion and neck supple.     Right lower leg: No edema.     Left lower leg: No edema.  Lymphadenopathy:     Head:     Right side of head: No submental, submandibular, tonsillar, preauricular or posterior auricular adenopathy.     Left side of head: No submental, submandibular, tonsillar, preauricular or posterior auricular adenopathy.     Cervical: No cervical adenopathy.  Skin:    General: Skin is warm and dry.  Neurological:     Mental Status: She is alert and oriented to person, place, and time.  Psychiatric:        Attention and Perception: Attention normal.        Mood and Affect: Mood normal.        Speech: Speech normal.        Behavior: Behavior normal. Behavior is cooperative.        Thought Content: Thought content normal.     Results for orders placed or performed in visit on 06/03/24  WET PREP FOR TRICH, YEAST, CLUE   Collection Time: 06/03/24  4:07 PM   Specimen: Urine   Urine  Result Value Ref Range   Trichomonas Exam Negative Negative   Yeast Exam Negative Negative   Clue Cell Exam Negative Negative  Urinalysis, Routine w reflex microscopic    Collection Time: 06/03/24  4:07 PM  Result Value Ref Range   Specific Gravity, UA 1.025 1.005 - 1.030   pH, UA 5.5 5.0 - 7.5   Color, UA Yellow Yellow   Appearance Ur Clear Clear   Leukocytes,UA Negative Negative   Protein,UA Negative Negative/Trace   Glucose, UA Negative Negative   Ketones, UA Negative Negative   RBC, UA Negative Negative   Bilirubin, UA Negative Negative   Urobilinogen, Ur 0.2 0.2 - 1.0 mg/dL   Nitrite, UA Negative Negative   Microscopic  Examination Comment       Assessment & Plan:   Problem List Items Addressed This Visit       Respiratory   COPD exacerbation (HCC) - Primary   Acute for a little over one week, tried Prednisone  at home with no benefit.  Symptoms ongoing.  Will send in Doxycycline  100 MG BID For 7 days. Recommend complete cessation of smoking, she is cutting back.  Continue inhalers at home.  Recommend: - Increased rest - Increasing Fluids - Acetaminophen  / ibuprofen  as needed for fever/pain.  - Salt water gargling, chloraseptic spray and throat lozenges - Mucinex.  - Saline sinus flushes or a neti pot.  - Humidifying the air.  - Strict ER precautions given.      Other Visit Diagnoses       Urinary symptom or sign       Urine and wet prep negative, discussed with patient.   Relevant Orders   WET PREP FOR TRICH, YEAST, CLUE (Completed)   Urinalysis, Routine w reflex microscopic (Completed)        Follow up plan: Return if symptoms worsen or fail to improve.

## 2024-06-18 NOTE — Progress Notes (Unsigned)
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  Procedure: After evaluating the varicosities with ultrasound I elected to move forward with sclerotherapy using SDS mixed with 1% Lidocaine  was performed on the left lower extremity.  Injections were made under direct ultrasound visualization.  Compression wraps were placed.  The patient tolerated the procedure well.  Plan:  Follow up as needed.

## 2024-06-22 ENCOUNTER — Ambulatory Visit (INDEPENDENT_AMBULATORY_CARE_PROVIDER_SITE_OTHER): Admitting: Vascular Surgery

## 2024-06-22 ENCOUNTER — Encounter (INDEPENDENT_AMBULATORY_CARE_PROVIDER_SITE_OTHER): Payer: Self-pay | Admitting: Vascular Surgery

## 2024-06-22 VITALS — BP 135/72 | HR 76 | Resp 18 | Wt 158.6 lb

## 2024-06-22 DIAGNOSIS — I8312 Varicose veins of left lower extremity with inflammation: Secondary | ICD-10-CM | POA: Diagnosis not present

## 2024-06-22 DIAGNOSIS — I8310 Varicose veins of unspecified lower extremity with inflammation: Secondary | ICD-10-CM | POA: Insufficient documentation

## 2024-10-26 ENCOUNTER — Ambulatory Visit: Admitting: Dermatology

## 2024-11-16 ENCOUNTER — Ambulatory Visit: Admitting: Nurse Practitioner

## 2025-05-25 ENCOUNTER — Ambulatory Visit
